# Patient Record
Sex: Male | Born: 1935 | Race: White | Hispanic: No | Marital: Married | Smoking: Never smoker
Health system: Southern US, Community
[De-identification: ages and names within clinical notes are randomized; demographics above are authoritative.]

## PROBLEM LIST (undated history)

## (undated) DIAGNOSIS — G459 Transient cerebral ischemic attack, unspecified: Secondary | ICD-10-CM

## (undated) DIAGNOSIS — E119 Type 2 diabetes mellitus without complications: Secondary | ICD-10-CM

## (undated) DIAGNOSIS — M1990 Unspecified osteoarthritis, unspecified site: Secondary | ICD-10-CM

## (undated) DIAGNOSIS — S329XXA Fracture of unspecified parts of lumbosacral spine and pelvis, initial encounter for closed fracture: Secondary | ICD-10-CM

## (undated) DIAGNOSIS — M810 Age-related osteoporosis without current pathological fracture: Secondary | ICD-10-CM

## (undated) DIAGNOSIS — N400 Enlarged prostate without lower urinary tract symptoms: Secondary | ICD-10-CM

## (undated) DIAGNOSIS — I10 Essential (primary) hypertension: Secondary | ICD-10-CM

## (undated) DIAGNOSIS — E039 Hypothyroidism, unspecified: Secondary | ICD-10-CM

## (undated) DIAGNOSIS — E785 Hyperlipidemia, unspecified: Secondary | ICD-10-CM

## (undated) HISTORY — DX: Unspecified osteoarthritis, unspecified site: M19.90

## (undated) HISTORY — DX: Fracture of unspecified parts of lumbosacral spine and pelvis, initial encounter for closed fracture: S32.9XXA

---

## 1996-05-14 HISTORY — PX: OTHER SURGICAL HISTORY: SHX169

## 2003-10-20 ENCOUNTER — Other Ambulatory Visit: Payer: Self-pay

## 2004-01-26 ENCOUNTER — Other Ambulatory Visit: Payer: Self-pay

## 2004-02-17 ENCOUNTER — Other Ambulatory Visit: Payer: Self-pay

## 2004-02-17 ENCOUNTER — Inpatient Hospital Stay: Payer: Self-pay | Admitting: Unknown Physician Specialty

## 2004-03-30 ENCOUNTER — Other Ambulatory Visit: Payer: Self-pay

## 2004-04-17 ENCOUNTER — Inpatient Hospital Stay: Payer: Self-pay | Admitting: General Practice

## 2004-11-20 ENCOUNTER — Encounter: Payer: Self-pay | Admitting: Internal Medicine

## 2004-11-23 ENCOUNTER — Ambulatory Visit: Payer: Self-pay | Admitting: Internal Medicine

## 2004-12-12 ENCOUNTER — Encounter: Payer: Self-pay | Admitting: Internal Medicine

## 2005-05-14 HISTORY — PX: OTHER SURGICAL HISTORY: SHX169

## 2006-01-10 ENCOUNTER — Ambulatory Visit: Payer: Self-pay

## 2006-02-13 ENCOUNTER — Ambulatory Visit: Payer: Self-pay | Admitting: General Practice

## 2006-02-13 ENCOUNTER — Other Ambulatory Visit: Payer: Self-pay

## 2006-02-20 ENCOUNTER — Ambulatory Visit: Payer: Self-pay | Admitting: General Practice

## 2006-05-14 HISTORY — PX: OTHER SURGICAL HISTORY: SHX169

## 2006-11-04 ENCOUNTER — Other Ambulatory Visit: Payer: Self-pay

## 2006-11-04 ENCOUNTER — Ambulatory Visit: Payer: Self-pay | Admitting: General Practice

## 2006-11-14 ENCOUNTER — Inpatient Hospital Stay: Payer: Self-pay | Admitting: General Practice

## 2007-02-27 ENCOUNTER — Ambulatory Visit: Payer: Self-pay | Admitting: Internal Medicine

## 2007-03-15 ENCOUNTER — Ambulatory Visit: Payer: Self-pay | Admitting: Internal Medicine

## 2007-04-14 ENCOUNTER — Ambulatory Visit: Payer: Self-pay | Admitting: Internal Medicine

## 2008-05-14 HISTORY — PX: LUMBAR LAMINECTOMY: SHX95

## 2009-02-10 ENCOUNTER — Ambulatory Visit: Payer: Self-pay | Admitting: Neurosurgery

## 2009-02-11 ENCOUNTER — Ambulatory Visit (HOSPITAL_COMMUNITY): Admission: RE | Admit: 2009-02-11 | Discharge: 2009-02-12 | Payer: Self-pay | Admitting: Neurosurgery

## 2010-06-04 ENCOUNTER — Encounter: Payer: Self-pay | Admitting: Neurosurgery

## 2010-08-17 LAB — GLUCOSE, CAPILLARY: Glucose-Capillary: 77 mg/dL (ref 70–99)

## 2010-08-17 LAB — BASIC METABOLIC PANEL
BUN: 15 mg/dL (ref 6–23)
CO2: 29 mEq/L (ref 19–32)
Calcium: 9.3 mg/dL (ref 8.4–10.5)
Chloride: 96 mEq/L (ref 96–112)
GFR calc Af Amer: >60 mL/min (ref >60)
Glucose, Bld: 95 mg/dL (ref 70–99)
Potassium: 4.9 mEq/L (ref 3.5–5.1)
Sodium: 134 mEq/L — ABNORMAL LOW (ref 135–145)

## 2010-08-17 LAB — CBC
RBC: 3.87 MIL/uL — ABNORMAL LOW (ref 4.22–5.81)
RDW: 13.5 % (ref 11.5–15.5)

## 2010-12-29 ENCOUNTER — Ambulatory Visit: Payer: Self-pay | Admitting: Ophthalmology

## 2011-02-07 ENCOUNTER — Ambulatory Visit: Payer: Self-pay | Admitting: Ophthalmology

## 2011-09-20 ENCOUNTER — Emergency Department: Payer: Self-pay | Admitting: *Deleted

## 2011-09-20 LAB — CBC
MCV: 97 fL (ref 80–100)
RDW: 13.1 % (ref 11.5–14.5)

## 2011-09-20 LAB — BASIC METABOLIC PANEL
Anion Gap: 6 — ABNORMAL LOW (ref 7–16)
BUN: 15 mg/dL (ref 7–18)
Calcium, Total: 9.1 mg/dL (ref 8.5–10.1)
Creatinine: 0.77 mg/dL (ref 0.60–1.30)
EGFR (African American): >60
Glucose: 193 mg/dL — ABNORMAL HIGH (ref 65–99)
Potassium: 5 mmol/L (ref 3.5–5.1)

## 2012-09-23 ENCOUNTER — Ambulatory Visit: Payer: Self-pay | Admitting: Internal Medicine

## 2014-05-21 DIAGNOSIS — E119 Type 2 diabetes mellitus without complications: Secondary | ICD-10-CM | POA: Diagnosis not present

## 2014-05-26 DIAGNOSIS — E119 Type 2 diabetes mellitus without complications: Secondary | ICD-10-CM | POA: Diagnosis not present

## 2014-05-26 DIAGNOSIS — Z794 Long term (current) use of insulin: Secondary | ICD-10-CM | POA: Diagnosis not present

## 2014-09-24 DIAGNOSIS — E1165 Type 2 diabetes mellitus with hyperglycemia: Secondary | ICD-10-CM | POA: Diagnosis not present

## 2014-09-24 DIAGNOSIS — Z79899 Other long term (current) drug therapy: Secondary | ICD-10-CM | POA: Diagnosis not present

## 2014-09-24 DIAGNOSIS — E780 Pure hypercholesterolemia: Secondary | ICD-10-CM | POA: Diagnosis not present

## 2014-10-15 DIAGNOSIS — N401 Enlarged prostate with lower urinary tract symptoms: Secondary | ICD-10-CM | POA: Diagnosis not present

## 2014-10-15 DIAGNOSIS — I10 Essential (primary) hypertension: Secondary | ICD-10-CM | POA: Diagnosis not present

## 2014-10-15 DIAGNOSIS — E119 Type 2 diabetes mellitus without complications: Secondary | ICD-10-CM | POA: Diagnosis not present

## 2014-10-15 DIAGNOSIS — E780 Pure hypercholesterolemia: Secondary | ICD-10-CM | POA: Diagnosis not present

## 2014-10-17 DIAGNOSIS — S81802A Unspecified open wound, left lower leg, initial encounter: Secondary | ICD-10-CM | POA: Diagnosis not present

## 2014-10-17 DIAGNOSIS — Z23 Encounter for immunization: Secondary | ICD-10-CM | POA: Diagnosis not present

## 2014-10-17 DIAGNOSIS — S41109A Unspecified open wound of unspecified upper arm, initial encounter: Secondary | ICD-10-CM | POA: Diagnosis not present

## 2014-10-22 DIAGNOSIS — E119 Type 2 diabetes mellitus without complications: Secondary | ICD-10-CM | POA: Diagnosis not present

## 2014-11-18 DIAGNOSIS — E119 Type 2 diabetes mellitus without complications: Secondary | ICD-10-CM | POA: Diagnosis not present

## 2014-12-01 DIAGNOSIS — E119 Type 2 diabetes mellitus without complications: Secondary | ICD-10-CM | POA: Diagnosis not present

## 2014-12-31 DIAGNOSIS — M25512 Pain in left shoulder: Secondary | ICD-10-CM | POA: Diagnosis not present

## 2015-04-15 DIAGNOSIS — E119 Type 2 diabetes mellitus without complications: Secondary | ICD-10-CM | POA: Diagnosis not present

## 2015-04-15 DIAGNOSIS — Z79899 Other long term (current) drug therapy: Secondary | ICD-10-CM | POA: Diagnosis not present

## 2015-04-15 DIAGNOSIS — E7800 Pure hypercholesterolemia, unspecified: Secondary | ICD-10-CM | POA: Diagnosis not present

## 2015-04-21 DIAGNOSIS — Z79899 Other long term (current) drug therapy: Secondary | ICD-10-CM | POA: Diagnosis not present

## 2015-04-21 DIAGNOSIS — Z0000 Encounter for general adult medical examination without abnormal findings: Secondary | ICD-10-CM | POA: Diagnosis not present

## 2015-04-21 DIAGNOSIS — E119 Type 2 diabetes mellitus without complications: Secondary | ICD-10-CM | POA: Diagnosis not present

## 2015-06-06 DIAGNOSIS — E119 Type 2 diabetes mellitus without complications: Secondary | ICD-10-CM | POA: Diagnosis not present

## 2015-06-13 DIAGNOSIS — E119 Type 2 diabetes mellitus without complications: Secondary | ICD-10-CM | POA: Diagnosis not present

## 2015-06-13 DIAGNOSIS — Z794 Long term (current) use of insulin: Secondary | ICD-10-CM | POA: Diagnosis not present

## 2015-06-13 DIAGNOSIS — M1990 Unspecified osteoarthritis, unspecified site: Secondary | ICD-10-CM | POA: Diagnosis not present

## 2015-06-30 DIAGNOSIS — H93293 Other abnormal auditory perceptions, bilateral: Secondary | ICD-10-CM | POA: Diagnosis not present

## 2015-06-30 DIAGNOSIS — H6123 Impacted cerumen, bilateral: Secondary | ICD-10-CM | POA: Diagnosis not present

## 2015-08-01 DIAGNOSIS — M25511 Pain in right shoulder: Secondary | ICD-10-CM | POA: Diagnosis not present

## 2015-08-01 DIAGNOSIS — G8911 Acute pain due to trauma: Secondary | ICD-10-CM | POA: Diagnosis not present

## 2015-09-02 ENCOUNTER — Other Ambulatory Visit: Payer: Self-pay | Admitting: Neurological Surgery

## 2015-09-02 DIAGNOSIS — D496 Neoplasm of unspecified behavior of brain: Secondary | ICD-10-CM

## 2015-09-10 ENCOUNTER — Emergency Department: Payer: Commercial Managed Care - HMO

## 2015-09-10 ENCOUNTER — Encounter: Payer: Self-pay | Admitting: *Deleted

## 2015-09-10 ENCOUNTER — Inpatient Hospital Stay
Admission: EM | Admit: 2015-09-10 | Discharge: 2015-09-12 | DRG: 536 | Disposition: A | Discharge status: Skilled Nursing Facility | Payer: Commercial Managed Care - HMO | Attending: Internal Medicine | Admitting: Internal Medicine

## 2015-09-10 DIAGNOSIS — I10 Essential (primary) hypertension: Secondary | ICD-10-CM | POA: Diagnosis present

## 2015-09-10 DIAGNOSIS — W19XXXA Unspecified fall, initial encounter: Secondary | ICD-10-CM | POA: Diagnosis present

## 2015-09-10 DIAGNOSIS — Z833 Family history of diabetes mellitus: Secondary | ICD-10-CM

## 2015-09-10 DIAGNOSIS — Z8673 Personal history of transient ischemic attack (TIA), and cerebral infarction without residual deficits: Secondary | ICD-10-CM | POA: Diagnosis not present

## 2015-09-10 DIAGNOSIS — S72114A Nondisplaced fracture of greater trochanter of right femur, initial encounter for closed fracture: Secondary | ICD-10-CM | POA: Diagnosis not present

## 2015-09-10 DIAGNOSIS — S72001D Fracture of unspecified part of neck of right femur, subsequent encounter for closed fracture with routine healing: Secondary | ICD-10-CM | POA: Diagnosis not present

## 2015-09-10 DIAGNOSIS — Z96643 Presence of artificial hip joint, bilateral: Secondary | ICD-10-CM | POA: Diagnosis present

## 2015-09-10 DIAGNOSIS — N138 Other obstructive and reflux uropathy: Secondary | ICD-10-CM | POA: Diagnosis not present

## 2015-09-10 DIAGNOSIS — Z794 Long term (current) use of insulin: Secondary | ICD-10-CM

## 2015-09-10 DIAGNOSIS — Z9181 History of falling: Secondary | ICD-10-CM | POA: Diagnosis not present

## 2015-09-10 DIAGNOSIS — N400 Enlarged prostate without lower urinary tract symptoms: Secondary | ICD-10-CM

## 2015-09-10 DIAGNOSIS — R2689 Other abnormalities of gait and mobility: Secondary | ICD-10-CM | POA: Diagnosis not present

## 2015-09-10 DIAGNOSIS — E119 Type 2 diabetes mellitus without complications: Secondary | ICD-10-CM | POA: Diagnosis present

## 2015-09-10 DIAGNOSIS — Z8249 Family history of ischemic heart disease and other diseases of the circulatory system: Secondary | ICD-10-CM | POA: Diagnosis not present

## 2015-09-10 DIAGNOSIS — E785 Hyperlipidemia, unspecified: Secondary | ICD-10-CM | POA: Diagnosis present

## 2015-09-10 DIAGNOSIS — R293 Abnormal posture: Secondary | ICD-10-CM | POA: Diagnosis not present

## 2015-09-10 DIAGNOSIS — M6281 Muscle weakness (generalized): Secondary | ICD-10-CM | POA: Diagnosis not present

## 2015-09-10 DIAGNOSIS — Z7982 Long term (current) use of aspirin: Secondary | ICD-10-CM

## 2015-09-10 DIAGNOSIS — S72009A Fracture of unspecified part of neck of unspecified femur, initial encounter for closed fracture: Secondary | ICD-10-CM | POA: Diagnosis present

## 2015-09-10 DIAGNOSIS — S72001A Fracture of unspecified part of neck of right femur, initial encounter for closed fracture: Secondary | ICD-10-CM | POA: Diagnosis not present

## 2015-09-10 DIAGNOSIS — Z79899 Other long term (current) drug therapy: Secondary | ICD-10-CM

## 2015-09-10 DIAGNOSIS — S72111A Displaced fracture of greater trochanter of right femur, initial encounter for closed fracture: Secondary | ICD-10-CM | POA: Diagnosis not present

## 2015-09-10 DIAGNOSIS — S79911A Unspecified injury of right hip, initial encounter: Secondary | ICD-10-CM | POA: Diagnosis not present

## 2015-09-10 DIAGNOSIS — N401 Enlarged prostate with lower urinary tract symptoms: Secondary | ICD-10-CM | POA: Diagnosis not present

## 2015-09-10 DIAGNOSIS — M25551 Pain in right hip: Secondary | ICD-10-CM | POA: Diagnosis not present

## 2015-09-10 LAB — BASIC METABOLIC PANEL
Anion gap: 9 (ref 5–15)
BUN: 16 mg/dL (ref 6–20)
CALCIUM: 9.1 mg/dL (ref 8.9–10.3)
CHLORIDE: 99 mmol/L — AB (ref 101–111)
CO2: 27 mmol/L (ref 22–32)
CREATININE: 0.82 mg/dL (ref 0.61–1.24)
Glucose, Bld: 216 mg/dL — ABNORMAL HIGH (ref 65–99)
Potassium: 4.3 mmol/L (ref 3.5–5.1)
SODIUM: 135 mmol/L (ref 135–145)

## 2015-09-10 LAB — CBC
HCT: 35 % — ABNORMAL LOW (ref 40.0–52.0)
HEMOGLOBIN: 12 g/dL — AB (ref 13.0–18.0)
MCH: 31.7 pg (ref 26.0–34.0)
MCHC: 34.2 g/dL (ref 32.0–36.0)
MCV: 92.7 fL (ref 80.0–100.0)
PLATELETS: 166 10*3/uL (ref 150–440)
RBC: 3.77 MIL/uL — ABNORMAL LOW (ref 4.40–5.90)
RDW: 13.2 % (ref 11.5–14.5)
WBC: 10.7 10*3/uL — ABNORMAL HIGH (ref 3.8–10.6)

## 2015-09-10 LAB — GLUCOSE, CAPILLARY
GLUCOSE-CAPILLARY: 190 mg/dL — AB (ref 65–99)
GLUCOSE-CAPILLARY: 286 mg/dL — AB (ref 65–99)

## 2015-09-10 MED ORDER — FINASTERIDE 5 MG PO TABS
5.0000 mg | ORAL_TABLET | Freq: Every day | ORAL | Status: DC
  Administered 2015-09-11 – 2015-09-12 (×2): 5 mg via ORAL
  Filled 2015-09-10 (×2): qty 1

## 2015-09-10 MED ORDER — METFORMIN HCL 500 MG PO TABS
1000.0000 mg | ORAL_TABLET | Freq: Two times a day (BID) | ORAL | Status: DC
  Administered 2015-09-10 – 2015-09-12 (×4): 1000 mg via ORAL
  Filled 2015-09-10 (×4): qty 2

## 2015-09-10 MED ORDER — SODIUM CHLORIDE 0.9 % IV SOLN
INTRAVENOUS | Status: DC
  Administered 2015-09-10 – 2015-09-11 (×2): via INTRAVENOUS

## 2015-09-10 MED ORDER — SIMVASTATIN 20 MG PO TABS
20.0000 mg | ORAL_TABLET | Freq: Every day | ORAL | Status: DC
  Administered 2015-09-10 – 2015-09-11 (×2): 20 mg via ORAL
  Filled 2015-09-10 (×2): qty 1

## 2015-09-10 MED ORDER — MORPHINE SULFATE (PF) 2 MG/ML IV SOLN
2.0000 mg | INTRAVENOUS | Status: DC | PRN
  Administered 2015-09-11: 2 mg via INTRAVENOUS
  Filled 2015-09-10: qty 1

## 2015-09-10 MED ORDER — SENNOSIDES-DOCUSATE SODIUM 8.6-50 MG PO TABS: 1.0000 | ORAL_TABLET | Freq: Every evening | ORAL | Status: DC | PRN

## 2015-09-10 MED ORDER — ACETAMINOPHEN 325 MG PO TABS: 650.0000 mg | ORAL_TABLET | Freq: Four times a day (QID) | ORAL | Status: DC | PRN

## 2015-09-10 MED ORDER — LISINOPRIL 20 MG PO TABS
20.0000 mg | ORAL_TABLET | Freq: Every day | ORAL | Status: DC
  Administered 2015-09-11 – 2015-09-12 (×2): 20 mg via ORAL
  Filled 2015-09-10 (×2): qty 1

## 2015-09-10 MED ORDER — SODIUM CHLORIDE 0.9% FLUSH: 3.0000 mL | Freq: Two times a day (BID) | INTRAVENOUS | Status: DC

## 2015-09-10 MED ORDER — ONDANSETRON HCL 4 MG PO TABS: 4.0000 mg | ORAL_TABLET | Freq: Four times a day (QID) | ORAL | Status: DC | PRN

## 2015-09-10 MED ORDER — SODIUM CHLORIDE 0.9% FLUSH
3.0000 mL | Freq: Two times a day (BID) | INTRAVENOUS | Status: DC
  Administered 2015-09-11 – 2015-09-12 (×2): 3 mL via INTRAVENOUS

## 2015-09-10 MED ORDER — ONDANSETRON HCL 4 MG/2ML IJ SOLN: 4.0000 mg | Freq: Four times a day (QID) | INTRAMUSCULAR | Status: DC | PRN

## 2015-09-10 MED ORDER — ACETAMINOPHEN 650 MG RE SUPP: 650.0000 mg | Freq: Four times a day (QID) | RECTAL | Status: DC | PRN

## 2015-09-10 MED ORDER — OXYCODONE HCL 5 MG PO TABS
5.0000 mg | ORAL_TABLET | ORAL | Status: DC | PRN
  Administered 2015-09-10 – 2015-09-12 (×9): 5 mg via ORAL
  Filled 2015-09-10 (×9): qty 1

## 2015-09-10 MED ORDER — ASPIRIN EC 81 MG PO TBEC
81.0000 mg | DELAYED_RELEASE_TABLET | Freq: Every day | ORAL | Status: DC
  Administered 2015-09-11: 81 mg via ORAL
  Filled 2015-09-10 (×2): qty 1

## 2015-09-10 MED ORDER — INSULIN GLARGINE 100 UNIT/ML ~~LOC~~ SOLN
18.0000 [IU] | Freq: Every day | SUBCUTANEOUS | Status: DC
  Administered 2015-09-11 – 2015-09-12 (×2): 18 [IU] via SUBCUTANEOUS
  Filled 2015-09-10 (×3): qty 0.18

## 2015-09-10 MED ORDER — HYDROCODONE-ACETAMINOPHEN 5-325 MG PO TABS
1.0000 | ORAL_TABLET | Freq: Once | ORAL | Status: AC
  Administered 2015-09-10: 1 via ORAL
  Filled 2015-09-10: qty 1

## 2015-09-10 MED ORDER — CLOPIDOGREL BISULFATE 75 MG PO TABS
75.0000 mg | ORAL_TABLET | Freq: Every day | ORAL | Status: DC
  Administered 2015-09-11 – 2015-09-12 (×2): 75 mg via ORAL
  Filled 2015-09-10 (×2): qty 1

## 2015-09-10 MED ORDER — ENOXAPARIN SODIUM 40 MG/0.4ML ~~LOC~~ SOLN
40.0000 mg | SUBCUTANEOUS | Status: DC
  Administered 2015-09-10 – 2015-09-11 (×2): 40 mg via SUBCUTANEOUS
  Filled 2015-09-10 (×2): qty 0.4

## 2015-09-10 MED ORDER — SODIUM CHLORIDE 0.9% FLUSH: 3.0000 mL | INTRAVENOUS | Status: DC | PRN

## 2015-09-10 MED ORDER — MORPHINE SULFATE (PF) 2 MG/ML IV SOLN
2.0000 mg | Freq: Once | INTRAVENOUS | Status: AC
  Administered 2015-09-10: 2 mg via INTRAVENOUS
  Filled 2015-09-10: qty 1

## 2015-09-10 MED ORDER — SODIUM CHLORIDE 0.9 % IV SOLN: 250.0000 mL | INTRAVENOUS | Status: DC | PRN

## 2015-09-10 MED ORDER — DOCUSATE SODIUM 100 MG PO CAPS
100.0000 mg | ORAL_CAPSULE | Freq: Two times a day (BID) | ORAL | Status: DC
  Administered 2015-09-10 – 2015-09-12 (×4): 100 mg via ORAL
  Filled 2015-09-10 (×4): qty 1

## 2015-09-10 MED ORDER — TAMSULOSIN HCL 0.4 MG PO CAPS
0.4000 mg | ORAL_CAPSULE | Freq: Two times a day (BID) | ORAL | Status: DC
  Administered 2015-09-10 – 2015-09-12 (×4): 0.4 mg via ORAL
  Filled 2015-09-10 (×4): qty 1

## 2015-09-10 NOTE — H&P (Signed)
[place] [facility] [place] NAME: Brian Barry    MR#:  [id number]  DATE OF BIRTH:  Feb 12, 1930  DATE OF ADMISSION:  09/10/2015  PRIMARY CARE PHYSICIAN: No primary care provider on file.   REQUESTING/REFERRING PHYSICIAN: Dr. Reita Cliche  CHIEF COMPLAINT:   Fall and hip pain HISTORY OF PRESENT ILLNESS:  Brian Barry  is a 82 y.o. male with a known history of right  hip replacement by Dr. Marry Guan approximately 20 years ago and diabetes , hypertension, hyperlipidemia and 2 histories of TIA is presenting to the ED after he sustained a fall. Patient tripped over by stepping on a hole and fell on his rt side.  Right hip x-ray did not reveal any fracture but CAT scan of the hip has revealed total right hip arthroplasty and nondisplaced fracture of the right greater trochanter extending into the subtrochanteric femur  PAST MEDICAL HISTORY:   Diabetic medicines, hypertension and hyperlipidemia  2 TIAs  PAST SURGICAL HISTOIRY:  Hip replacement   SOCIAL HISTORY:   Social History  Substance Use Topics  . Smoking status: Never Smoker   . Smokeless tobacco: Not on file  . Alcohol Use: Not on file    FAMILY HISTORY:  Hypertension and diabetes mellitus runs in his family   DRUG ALLERGIES:  No Known Allergies  REVIEW OF SYSTEMS:  CONSTITUTIONAL: No fever, fatigue or weakness.  EYES: No blurred or double vision.  EARS, NOSE, AND THROAT: No tinnitus or ear pain.  RESPIRATORY: No cough, shortness of breath, wheezing or hemoptysis.  CARDIOVASCULAR: No chest pain, orthopnea, edema.  GASTROINTESTINAL: No nausea, vomiting, diarrhea or abdominal pain.  GENITOURINARY: No dysuria, hematuria.  ENDOCRINE: No polyuria, nocturia,  HEMATOLOGY: No anemia, easy bruising or bleeding SKIN: No rash or lesion. MUSCULOSKELETAL: reporting hip pain status post a hip replacement  .   NEUROLOGIC: No tingling, numbness, weakness.  PSYCHIATRY: No anxiety or depression.    MEDICATIONS AT HOME:   Prior to Admission medications   Medication Sig Start Date End Date Taking? Authorizing Provider  aspirin EC 81 MG tablet Take 81 mg by mouth daily.   Yes Historical Provider, MD  clopidogrel (PLAVIX) 75 MG tablet Take 75 mg by mouth daily. 07/11/15  Yes Historical Provider, MD  finasteride (PROSCAR) 5 MG tablet Take 5 mg by mouth daily. 07/11/15  Yes Historical Provider, MD  LANTUS SOLOSTAR 100 UNIT/ML Solostar Pen Inject 18 Units into the skin every morning. 06/13/15  Yes Historical Provider, MD  lisinopril (PRINIVIL,ZESTRIL) 20 MG tablet Take 20 mg by mouth daily. 07/11/15  Yes Historical Provider, MD  metFORMIN (GLUCOPHAGE) 500 MG tablet Take 1,000 mg by mouth 2 (two) times daily. 08/12/15  Yes Historical Provider, MD  simvastatin (ZOCOR) 20 MG tablet Take 20 mg by mouth at bedtime.  07/11/15  Yes Historical Provider, MD  tamsulosin (FLOMAX) 0.4 MG CAPS capsule Take 0.4 mg by mouth 2 (two) times daily. 08/08/15  Yes Historical Provider, MD      VITAL SIGNS:  Blood pressure 154/52, pulse 77, temperature 98.6 F (37 C), temperature source Oral, resp. rate 18, height 5\' 9"  (1.753 m), weight 70.308 kg (155 lb), SpO2 96 %.  PHYSICAL EXAMINATION:  GENERAL:  82 y.o.-year-old patient lying in the bed with no acute distress.  EYES: Pupils equal, round, reactive to light and accommodation. No scleral icterus. Extraocular muscles intact.  HEENT: Head atraumatic, normocephalic. Oropharynx and nasopharynx clear.  NECK:  Supple, no jugular venous distention. No thyroid  enlargement, no tenderness.  LUNGS: Normal breath sounds bilaterally, no wheezing, rales,rhonchi or crepitation. No use of accessory muscles of respiration.  CARDIOVASCULAR: S1, S2 normal. No murmurs, rubs, or gallops.  ABDOMEN: Soft, nontender, nondistended. Bowel sounds present. No organomegaly or mass.  EXTREMITIES:  right hip is tender to touch externally rotated and abducted. No pedal edema, cyanosis, or  clubbing.  NEUROLOGIC: Cranial nerves II through XII are intact. Muscle strength 5/5 in all extremities. Sensation intact. Gait not checked.  PSYCHIATRIC: The patient is alert and oriented x 3.  SKIN: No obvious rash, lesion, or ulcer.   LABORATORY PANEL:   CBC  Recent Labs Lab 09/10/15 1509  WBC 10.7*  HGB 12.0*  HCT 35.0*  PLT 166   ------------------------------------------------------------------------------------------------------------------  Chemistries   Recent Labs Lab 09/10/15 1509  NA 135  K 4.3  CL 99*  CO2 27  GLUCOSE 216*  BUN 16  CREATININE 0.82  CALCIUM 9.1   ------------------------------------------------------------------------------------------------------------------  Cardiac Enzymes No results for input(s): TROPONINI in the last 168 hours. ------------------------------------------------------------------------------------------------------------------  RADIOLOGY:  Ct Hip Right Wo Contrast  09/10/2015  CLINICAL DATA:  Landed on the right hip with subsequent pain. EXAM: CT OF THE RIGHT HIP WITHOUT CONTRAST TECHNIQUE: Multidetector CT imaging of the right hip was performed according to the standard protocol. Multiplanar CT image reconstructions were also generated. COMPARISON:  None. FINDINGS: There is a total left hip arthroplasty with beam hardening artifact resulting from the arthroplasty partially obscuring the adjacent soft tissue and osseous structures. There is an acute nondisplaced fracture of the right greater trochanter extending into the subtrochanteric femur. There is no hardware failure or complication. Muscles scratch at the muscles are normal. There is no fluid collection or hematoma. There is no muscle atrophy. There is peripheral vascular atherosclerotic disease. IMPRESSION: Total left hip arthroplasty with beam hardening artifact resulting from the arthroplasty partially obscuring the adjacent soft tissue and osseous structures. Acute  nondisplaced fracture of the right greater trochanter extending into the subtrochanteric femur. Electronically Signed   By: Kathreen Devoid   On: 09/10/2015 13:17   Dg Hip Unilat With Pelvis 2-3 Views Right  09/10/2015  CLINICAL DATA:  82 year old male with history of right-sided hip pain after fall. Unable to bear weight. EXAM: DG HIP (WITH OR WITHOUT PELVIS) 2-3V RIGHT COMPARISON:  No priors. FINDINGS: The bony pelvis is intact. Bilateral total hip arthroplasties are noted. Prostatic right femoral head is located. No periprosthetic fractures or other acute abnormalities are identified. There is asymmetric CT of the left prosthetic femoral head in the superior lateral aspect of the left acetabulum, suggesting some prosthetic wear. IMPRESSION: 1. No acute osseous abnormality of the bony pelvis or the right hip. 2. Status post bilateral total hip arthroplasties, as above, with evidence of significant wear in the prosthetic left acetabular cup. Electronically Signed   By: Vinnie Langton M.D.   On: 09/10/2015 11:21    EKG:   Orders placed or performed in visit on 11/04/06  . EKG 12-Lead    IMPRESSION AND PLAN:  82 year old male brought into the ED after he sustained a fall. CT of the right hip has revealed acute right hip fracture but he is not a surgical candidate in view of right hip arthroplasty as recommended by Dr. Sabra Heck. Patient is getting admitted to the hospital for pain management and possible placement    #acute nondisplaced fracture of the right greater trochanter  Patient is not a surgical candidate according to orthopedics Dr. Sabra Heck  Provide  pain management as needed Consult orthopedics Dr. Sabra Heck and physical therapy Patient most likely might need a placement  #Essential hypertension Continue home medication lisinopril and titrate as needed  #Insulin requiring diabetes mellitus- Continue Lantus subcutaneous daily and metformin and sliding scale insulin. Patient will be on  diabetic diet  #Hyperlipidemia continue statin  #History's of TIA-continue aspirin and Plavix as patient is not getting surgery for hip fracture     All the records are reviewed and case discussed with ED provider. Management plans discussed with the patient, wife  and they are in agreement.  CODE STATUS: fc,Wife and daughter at the healthcare power of attorney  TOTAL TIME TAKING CARE OF THIS PATIENT:45 minutes.    Nicholes Mango M.D on 09/10/2015 at 5:58 PM  Between 7am to 6pm - Pager - [phone]  After 6pm go to [email or web address] - password [email or web address] [facility]  [facility]  Office  [phone]  CC: Primary care physician; No primary care provider on file.

## 2015-09-10 NOTE — ED Provider Notes (Signed)
[facility] Emergency Department Provider Note   ____________________________________________  Time seen: I have reviewed the triage vital signs and the triage nursing note.  HISTORY  Chief Complaint Hip Pain   Historian Patient and spouse and daughter  HPI Brian Barry is a 82 y.o. male with hx of right hip replacement by [facility] near 20 years ago, here with fall onto metal directly over the right hip today. Pain is moderate. Unable to walk or get up. He was brought in here by EMS.  No weakness or numbness. No back pain. No head injury. No chest or abdominal pain.    No past medical history on file.  There are no active problems to display for this patient.   No past surgical history on file.  Current Outpatient Rx  Name  Route  Sig  Dispense  Refill  . aspirin EC 81 MG tablet   Oral   Take 81 mg by mouth daily.         . clopidogrel (PLAVIX) 75 MG tablet   Oral   Take 75 mg by mouth daily.         . finasteride (PROSCAR) 5 MG tablet   Oral   Take 5 mg by mouth daily.         Marland Kitchen LANTUS SOLOSTAR 100 UNIT/ML Solostar Pen   Subcutaneous   Inject 18 Units into the skin every morning.           Dispense as written.   Marland Kitchen lisinopril (PRINIVIL,ZESTRIL) 20 MG tablet   Oral   Take 20 mg by mouth daily.         . metFORMIN (GLUCOPHAGE) 500 MG tablet   Oral   Take 1,000 mg by mouth 2 (two) times daily.         . simvastatin (ZOCOR) 20 MG tablet   Oral   Take 20 mg by mouth at bedtime.          . tamsulosin (FLOMAX) 0.4 MG CAPS capsule   Oral   Take 0.4 mg by mouth 2 (two) times daily.           Allergies Review of patient's allergies indicates no known allergies.  No family history on file.  Social History Social History  Substance Use Topics  . Smoking status: Not on file  . Smokeless tobacco: Not on file  . Alcohol Use: Not on file    Review of Systems  Constitutional: Negative for Recent illness. Eyes:  Negative for visual changes. ENT: Negative for sore throat. Cardiovascular: Negative for chest pain. Respiratory: Negative for shortness of breath. Gastrointestinal: Negative for abdominal pain, vomiting and diarrhea. Genitourinary: Negative for dysuria. Musculoskeletal: Negative for back pain. Skin: Negative for rash. Neurological: Negative for headache. 10 point Review of Systems otherwise negative ____________________________________________   PHYSICAL EXAM:  VITAL SIGNS: ED Triage Vitals  Enc Vitals Group     BP 09/10/15 1047 140/71 mmHg     Pulse Rate 09/10/15 1047 93     Resp 09/10/15 1047 94     Temp 09/10/15 1047 98.2 F (36.8 C)     Temp Source 09/10/15 1047 Oral     SpO2 09/10/15 1047 94 %     Weight 09/10/15 1047 155 lb (70.308 kg)     Height 09/10/15 1047 5\' 9"  (1.753 m)     Head Cir --      Peak Flow --      Pain Score 09/10/15 1047 2  Pain Loc --      Pain Edu? --      Excl. in [place]? --      Constitutional: Alert and oriented. Well appearing and in no distress. HEENT   Head: Normocephalic and atraumatic.      Eyes: Conjunctivae are normal. PERRL. Normal extraocular movements.      Ears:         Nose: No congestion/rhinnorhea.   Mouth/Throat: Mucous membranes are moist.   Neck: No stridor. Cardiovascular/Chest: Normal rate, regular rhythm.  No murmurs, rubs, or gallops. Respiratory: Normal respiratory effort without tachypnea nor retractions. Breath sounds are clear and equal bilaterally. No wheezes/rales/rhonchi. Gastrointestinal: Soft. No distention, no guarding, no rebound. Nontender.    Genitourinary/rectal:Deferred Musculoskeletal: Pelvis stable. Hip tenderness to palpation right lateral. No visible ecchymosis. Neurologic:  Normal speech and language. No gross or focal neurologic deficits are appreciated. Skin:  Skin is warm, dry and intact. No rash noted. Psychiatric: Mood and affect are normal. Speech and behavior are normal. Patient  exhibits appropriate insight and judgment.  ____________________________________________   EKG I, Lisa Roca, MD, the attending physician have personally viewed and interpreted all ECGs.  None ____________________________________________  LABS (pertinent positives/negatives)  None  ____________________________________________  RADIOLOGY All Xrays were viewed by me. Imaging interpreted by Radiologist.  Hip right with pelvis:  IMPRESSION: 1. No acute osseous abnormality of the bony pelvis or the right hip. 2. Status post bilateral total hip arthroplasties, as above, with evidence of significant wear in the prosthetic left acetabular cup  CT brain without contrast:  CLINICAL DATA: Landed on the right hip with subsequent pain.  EXAM: CT OF THE RIGHT HIP WITHOUT CONTRAST  TECHNIQUE: Multidetector CT imaging of the right hip was performed according to the standard protocol. Multiplanar CT image reconstructions were also generated.  COMPARISON: None.  FINDINGS: There is a total left hip arthroplasty with beam hardening artifact resulting from the arthroplasty partially obscuring the adjacent soft tissue and osseous structures. There is an acute nondisplaced fracture of the right greater trochanter extending into the subtrochanteric femur. There is no hardware failure or complication.  Muscles scratch at the muscles are normal. There is no fluid collection or hematoma. There is no muscle atrophy. There is peripheral vascular atherosclerotic disease.  IMPRESSION: Total left hip arthroplasty with beam hardening artifact resulting from the arthroplasty partially obscuring the adjacent soft tissue and osseous structures.  Acute nondisplaced fracture of the right greater trochanter extending into the subtrochanteric femur. __________________________________________  PROCEDURES  Procedure(s) performed: None  Critical Care performed:  None  ____________________________________________   ED COURSE / ASSESSMENT AND PLAN  Pertinent labs & imaging results that were available during my care of the patient were reviewed by me and considered in my medical decision making (see chart for details).   No other complaints other than right hip laterally.  Xray without traumatic finding, but pt unable to sit up.  Tried norco and ordered CT hip.  CT hip indicates trochanteric fracture. I discussed this with Dr. Sabra Heck, on call for orthopedics, who recommended nonoperative management given the fact that the patient already has a hip replacement. He recommended touchdown weightbearing as tolerated. Use of walker.  Patient was unable to tolerate any weight. IV was placed is given IV pain medication. Patient will need hospitalization for IV pain control and physical therapy evaluation for likely acute care rehabilitation placement.     CONSULTATIONS:   Orthopedics, Dr. Sabra Heck, by phone who reviewed the images and the clinical history  and made recommendations.   Patient / Family / Caregiver informed of clinical course, medical decision-making process, and agree with plan.    ___________________________________________   FINAL CLINICAL IMPRESSION(S) / ED DIAGNOSES   Final diagnoses:  Closed right hip fracture, initial encounter [facility])              Note: This dictation was prepared with Dragon dictation. Any transcriptional errors that result from this process are unintentional   Lisa Roca, MD 09/10/15 1440

## 2015-09-10 NOTE — ED Notes (Signed)
Pt states that he was pushing a 2 wheeled cart, stepped in a hole, and landed on his rt hip on a grate. Pt has a hx of bilat hip replacements and left knee replacement, pt is having pain in the rt hip, pt is able to straighten the leg, but is painful to do so

## 2015-09-11 LAB — URINALYSIS COMPLETE WITH MICROSCOPIC (ARMC ONLY)
BILIRUBIN URINE: NEGATIVE
Bacteria, UA: NONE SEEN
GLUCOSE, UA: 50 mg/dL — AB
Hgb urine dipstick: NEGATIVE
KETONES UR: NEGATIVE mg/dL
Leukocytes, UA: NEGATIVE
Nitrite: NEGATIVE
Protein, ur: NEGATIVE mg/dL
RBC / HPF: NONE SEEN RBC/hpf (ref 0–5)
SPECIFIC GRAVITY, URINE: 1.006 (ref 1.005–1.030)
Squamous Epithelial / LPF: NONE SEEN
WBC, UA: NONE SEEN WBC/hpf (ref 0–5)
pH: 5 (ref 5.0–8.0)

## 2015-09-11 LAB — CBC
HEMATOCRIT: 32.6 % — AB (ref 40.0–52.0)
HEMOGLOBIN: 11.1 g/dL — AB (ref 13.0–18.0)
MCH: 32.2 pg (ref 26.0–34.0)
MCHC: 34 g/dL (ref 32.0–36.0)
MCV: 94.5 fL (ref 80.0–100.0)
PLATELETS: 147 10*3/uL — AB (ref 150–440)
RBC: 3.45 MIL/uL — AB (ref 4.40–5.90)
RDW: 13.1 % (ref 11.5–14.5)
WBC: 6.5 10*3/uL (ref 3.8–10.6)

## 2015-09-11 LAB — COMPREHENSIVE METABOLIC PANEL
ALBUMIN: 3.6 g/dL (ref 3.5–5.0)
ALK PHOS: 52 U/L (ref 38–126)
ALT: 14 U/L — ABNORMAL LOW (ref 17–63)
ANION GAP: 9 (ref 5–15)
AST: 21 U/L (ref 15–41)
BUN: 13 mg/dL (ref 6–20)
CALCIUM: 8.6 mg/dL — AB (ref 8.9–10.3)
CHLORIDE: 102 mmol/L (ref 101–111)
CO2: 26 mmol/L (ref 22–32)
Creatinine, Ser: 0.72 mg/dL (ref 0.61–1.24)
GFR calc non Af Amer: >60 mL/min (ref >60)
Glucose, Bld: 159 mg/dL — ABNORMAL HIGH (ref 65–99)
POTASSIUM: 3.8 mmol/L (ref 3.5–5.1)
SODIUM: 137 mmol/L (ref 135–145)
Total Bilirubin: 0.8 mg/dL (ref 0.3–1.2)
Total Protein: 5.6 g/dL — ABNORMAL LOW (ref 6.5–8.1)

## 2015-09-11 LAB — GLUCOSE, CAPILLARY: GLUCOSE-CAPILLARY: 135 mg/dL — AB (ref 65–99)

## 2015-09-11 MED ORDER — POLYETHYLENE GLYCOL 3350 17 G PO PACK
17.0000 g | PACK | Freq: Every day | ORAL | Status: DC
  Administered 2015-09-11 – 2015-09-12 (×2): 17 g via ORAL
  Filled 2015-09-11 (×2): qty 1

## 2015-09-11 NOTE — Consult Note (Signed)
ORTHOPAEDIC CONSULTATION  REQUESTING PHYSICIAN: Loletha Grayer, MD  Chief Complaint: Right hip pain  HPI: Brian Barry is a 82 y.o. male who complains of  right hip pain following a fall yesterday at home.  He tripped on a steel grating.  He has had bilateral total hip replacements done by Dr. Marry Guan years ago.  The right one was revised for dislocations.  He was seen in the emergency room at [facility] and x-rays and CT scan showed a nondisplaced hairline fracture of the right greater trochanter.  There was no sign of loosening or damage to the total hip replacement itself.  He was admitted for pain control as he could not ambulate.  No past medical history on file. No past surgical history on file. Social History   Social History  . Marital Status: Married    Spouse Name: N/A  . Number of Children: N/A  . Years of Education: N/A   Social History Main Topics  . Smoking status: Never Smoker   . Smokeless tobacco: Not on file  . Alcohol Use: Not on file  . Drug Use: Not on file  . Sexual Activity: Not on file   Other Topics Concern  . Not on file   Social History Narrative  . No narrative on file   No family history on file. No Known Allergies Prior to Admission medications   Medication Sig Start Date End Date Taking? Authorizing Provider  aspirin EC 81 MG tablet Take 81 mg by mouth daily.   Yes Historical Provider, MD  clopidogrel (PLAVIX) 75 MG tablet Take 75 mg by mouth daily. 07/11/15  Yes Historical Provider, MD  finasteride (PROSCAR) 5 MG tablet Take 5 mg by mouth daily. 07/11/15  Yes Historical Provider, MD  LANTUS SOLOSTAR 100 UNIT/ML Solostar Pen Inject 18 Units into the skin every morning. 06/13/15  Yes Historical Provider, MD  lisinopril (PRINIVIL,ZESTRIL) 20 MG tablet Take 20 mg by mouth daily. 07/11/15  Yes Historical Provider, MD  metFORMIN (GLUCOPHAGE) 500 MG tablet Take 1,000 mg by mouth 2 (two) times daily. 08/12/15  Yes Historical Provider, MD  simvastatin (ZOCOR)  20 MG tablet Take 20 mg by mouth at bedtime.  07/11/15  Yes Historical Provider, MD  tamsulosin (FLOMAX) 0.4 MG CAPS capsule Take 0.4 mg by mouth 2 (two) times daily. 08/08/15  Yes Historical Provider, MD   Ct Hip Right Wo Contrast  09/10/2015  CLINICAL DATA:  Landed on the right hip with subsequent pain. EXAM: CT OF THE RIGHT HIP WITHOUT CONTRAST TECHNIQUE: Multidetector CT imaging of the right hip was performed according to the standard protocol. Multiplanar CT image reconstructions were also generated. COMPARISON:  None. FINDINGS: There is a total left hip arthroplasty with beam hardening artifact resulting from the arthroplasty partially obscuring the adjacent soft tissue and osseous structures. There is an acute nondisplaced fracture of the right greater trochanter extending into the subtrochanteric femur. There is no hardware failure or complication. Muscles scratch at the muscles are normal. There is no fluid collection or hematoma. There is no muscle atrophy. There is peripheral vascular atherosclerotic disease. IMPRESSION: Total left hip arthroplasty with beam hardening artifact resulting from the arthroplasty partially obscuring the adjacent soft tissue and osseous structures. Acute nondisplaced fracture of the right greater trochanter extending into the subtrochanteric femur. Electronically Signed   By: Kathreen Devoid   On: 09/10/2015 13:17   Dg Hip Unilat With Pelvis 2-3 Views Right  09/10/2015  CLINICAL DATA:  82 year old male with history of  right-sided hip pain after fall. Unable to bear weight. EXAM: DG HIP (WITH OR WITHOUT PELVIS) 2-3V RIGHT COMPARISON:  No priors. FINDINGS: The bony pelvis is intact. Bilateral total hip arthroplasties are noted. Prostatic right femoral head is located. No periprosthetic fractures or other acute abnormalities are identified. There is asymmetric CT of the left prosthetic femoral head in the superior lateral aspect of the left acetabulum, suggesting some prosthetic  wear. IMPRESSION: 1. No acute osseous abnormality of the bony pelvis or the right hip. 2. Status post bilateral total hip arthroplasties, as above, with evidence of significant wear in the prosthetic left acetabular cup. Electronically Signed   By: Vinnie Langton M.D.   On: 09/10/2015 11:21    Positive ROS: All other systems have been reviewed and were otherwise negative with the exception of those mentioned in the HPI and as above.  Physical Exam: General: Alert, no acute distress Cardiovascular: No pedal edema Respiratory: No cyanosis, no use of accessory musculature GI: No organomegaly, abdomen is soft and non-tender Skin: No lesions in the area of chief complaint Neurologic: Sensation intact distally Psychiatric: Patient is competent for consent with normal mood and affect Lymphatic: No axillary or cervical lymphadenopathy  MUSCULOSKELETAL: The patient is awake and alert and sitting in a chair.  He is tender over the right hip laterally.  Gentle hip motion is satisfactory.  Neurovascular status good distally except for a drop foot on the right, which from prior back injury.  Assessment: Nondisplaced right greater trochanteric hip fracture  Plan: Protected weightbearing and pain control. Follow-up with Dr. Marry Guan in 7-10 days.    Park Breed, MD [phone]   09/11/2015 1:18 PM

## 2015-09-11 NOTE — Care Management Note (Signed)
Case Management Note  Patient Details  Name: CALLAN REATEGUI MRN: [id number] Date of Birth: 1929-12-23  Subjective/Objective:      82yo Mr Maynard Pinta was admitted 09/10/15 after he fell at home. Dx: Non-displaced right greater trochanter hip fracture. Received bilateral total hip replacements by Dr Marry Guan years ago. Resides at home with his wife. Uses a rolling walker PRN at home. Admitted for pain control and non-weightbearing until he can be accepted at a rehab. SW is following for placement.               Action/Plan:   Expected Discharge Date:  09/13/15               Expected Discharge Plan:     In-House Referral:     Discharge planning Services     Post Acute Care Choice:    Choice offered to:     DME Arranged:    DME Agency:     HH Arranged:    HH Agency:     Status of Service:     Medicare Important Message Given:    Date Medicare IM Given:    Medicare IM give by:    Date Additional Medicare IM Given:    Additional Medicare Important Message give by:     If discussed at [place] of Stay Meetings, dates discussed:    Additional Comments:  Jezabelle Chisolm A, RN 09/11/2015, 2:15 PM

## 2015-09-11 NOTE — Clinical Social Work Note (Signed)
Clinical Social Work Assessment  Patient Details  Name: Brian Barry MRN: [id number] Date of Birth: 01/25/1930  Date of referral:  09/11/15               Reason for consult:  Facility Placement                Permission sought to share information with:  Family Supports, Customer service manager Permission granted to share information::  Yes, Verbal Permission Granted  Name::     Brian Barry ( wife)  [phone]  Agency::  yes  Relationship::  yes  Contact Information:  yes  Housing/Transportation Living arrangements for the past 2 months:  Single Family Home Source of Information:  Patient, Spouse, Adult Children Patient Interpreter Needed:  None Criminal Activity/Legal Involvement Pertinent to Current Situation/Hospitalization:  No - Comment as needed Significant Relationships:  Adult Children, Church, Spouse Lives with:  Spouse, Pets (Dog named Sophie) Do you feel safe going back to the place where you live?  Yes Need for family participation in patient care:  Yes (Comment)  Care giving concerns: none   Social Worker assessment / plan: LCSW met with patient and his wife Brian Barry and one of their daughters. All parties are agreeable to short term rehab. Patient has fractured his right hip. He is continent but uses urinal for now. In the evening at home he uses a walker. He lives with his wife Brian Barry and dog named Sophie on a one level house ( 4 steps at entrances)Patient is oriented x3 and is diabetic and requires a diabetic diet. Patient has given verbal consent to speak to family and facilities. He has a preference for [facility] as he has been there before. Employment status:  Retired Forensic psychologist PT Recommendations:  Not assessed at this time Information / Referral to community resources:  [facility]  Patient/Family's Response to care:  Excellent care   Patient/Family's Understanding of and Emotional Response to  Diagnosis, Current Treatment, and Prognosis: patient and family agree rehab will be essential for pts recovery  Emotional Assessment Appearance:  Appears stated age Attitude/Demeanor/Rapport:   (Pleasant and cooperative) Affect (typically observed):  Accepting, Appropriate, Happy Orientation:  Oriented to Self, Oriented to Place, Oriented to  Time, Oriented to Situation Alcohol / Substance use:  Never Used Psych involvement (Current and /or in the community):  No (Comment)  Discharge Needs  Concerns to be addressed:  No discharge needs identified Readmission within the last 30 days:  No Current discharge risk:  None Barriers to Discharge:  No Barriers Identified   Joana Reamer, LCSW 09/11/2015, 2:02 PM

## 2015-09-11 NOTE — Progress Notes (Signed)
Physical Therapy Evaluation Patient Details Name: Brian Barry MRN: [id number] DOB: 12/08/1929 Today's Date: 09/11/2015   History of Present Illness  Patient is an 82 y.o. male admitted on 10 September 2015 after sustaining fall with non-displaced fx of R greater trochanter. Patient had hip replacement 20 years ago and hardware still intact. PMH includes DM, HTN, hyperlipidemia, and 2 TIAs.   Clinical Impression  Patient is a previously modified independent male with household ambulation with reported balance deficits at baseline. Upon evaluation, patient demonstrates decreased ability to perform sit to stand and stand pivot transfers due to increased pain and inability to lift R LE. Was able to perform after active assisted marching in standing performed, allowing him to take small steps and transfer. Patient now also has impairments in ROM and muscle strength as well. Because patient is not at baseline level of function and has hx of fall from balance deficits, it is believed that he will continue to benefit from skilled and progressive PT both in and out of the hospital. Patient is agreeable to SNF and prefers [facility].    Follow Up Recommendations SNF    Equipment Recommendations  None recommended by PT    Recommendations for Other Services       Precautions / Restrictions Precautions Precautions: Fall Restrictions Weight Bearing Restrictions: Yes (WBAT)      Mobility  Bed Mobility Overal bed mobility: Needs Assistance Bed Mobility: Supine to Sit     Supine to sit: Mod assist     General bed mobility comments: Patient requires moderate assistance coming to EOB. Able to scoot and use UEs to push/pull weight.  Transfers Overall transfer level: Needs assistance Equipment used: Rolling walker (2 wheeled) Transfers: Sit to/from [facility] Sit to Stand: Min assist Stand pivot transfers: Mod assist       General transfer comment: Patient requires moderate  assistance with RW and balance when performing stand pivot transfer to chair. Takes small steps, showing decreased ability to weightshift.  Ambulation/Gait                Stairs            Wheelchair Mobility    Modified Rankin (Stroke Patients Only)       Balance Overall balance assessment: Needs assistance;History of Falls Sitting-balance support: Feet supported;Single extremity supported Sitting balance-Leahy Scale: Good     Standing balance support: Bilateral upper extremity supported Standing balance-Leahy Scale: Poor Standing balance comment: Patient with increased forward lean Single Leg Stance - Right Leg:  (Poor due to pain) Single Leg Stance - Left Leg:  (Fair)                         Pertinent Vitals/Pain Pain Assessment: 0-10 Pain Score: 5  Pain Location: R hip Pain Descriptors / Indicators: Aching Pain Intervention(s): Limited activity within patient's tolerance;Monitored during session;Premedicated before session;Ice applied    Home Living Family/patient expects to be discharged to:: Private residence Living Arrangements: Spouse/significant other Available Help at Discharge: Family;Available PRN/intermittently Type of Home: House Home Access: Stairs to enter Entrance Stairs-Rails: Right Entrance Stairs-Number of Steps: 4 Home Layout: Two level;Able to live on main level with bedroom/bathroom Home Equipment: Gilford Rile - 2 wheels;Shower seat;Grab bars - tub/shower;Grab bars - toilet      Prior Function Level of Independence: Needs assistance   Gait / Transfers Assistance Needed: Modified independent with AD  ADL's / Homemaking Assistance Needed: Performed by wife  Hand Dominance        Extremity/Trunk Assessment   Upper Extremity Assessment: Overall WFL for tasks assessed           Lower Extremity Assessment: Generalized weakness;RLE deficits/detail RLE Deficits / Details: Decreased A/PROM due to pain; 4/5 MMT,  limited by pain; hx of drop foot on R       Communication   Communication: No difficulties  Cognition Arousal/Alertness: Awake/alert Behavior During Therapy: WFL for tasks assessed/performed Overall Cognitive Status: Within Functional Limits for tasks assessed                      General Comments      Exercises Other Exercises Other Exercises: Weightshifting in standing; marching in standing AAROM/AROM      Assessment/Plan    PT Assessment Patient needs continued PT services  PT Diagnosis Difficulty walking;Generalized weakness;Acute pain   PT Problem List Decreased strength;Decreased range of motion;Decreased activity tolerance;Decreased balance;Decreased mobility;Decreased knowledge of use of DME;Decreased safety awareness;Decreased skin integrity  PT Treatment Interventions Gait training;Stair training;Functional mobility training;Therapeutic activities;Therapeutic exercise;Balance training;Patient/family education   PT Goals (Current goals can be found in the Care Plan section) Acute Rehab PT Goals Patient Stated Goal: "To get better" PT Goal Formulation: With patient/family Time For Goal Achievement: 09/25/15 Potential to Achieve Goals: Good    Frequency BID   Barriers to discharge Decreased caregiver support;Inaccessible home environment      Co-evaluation               End of Session Equipment Utilized During Treatment: Gait belt Activity Tolerance: Patient limited by pain Patient left: in chair;with call bell/phone within reach;with chair alarm set;with family/visitor present Nurse Communication: Mobility status         Time: 1010-1050 PT Time Calculation (min) (ACUTE ONLY): 40 min   Charges:   PT Evaluation $PT Eval Low Complexity: 1 Procedure     PT G Codes:        Dorice Lamas, PT, DPT 09/11/2015, 11:48 AM

## 2015-09-11 NOTE — Progress Notes (Signed)
LCSW completed Fl2, assessment and obtained passr number and faxed out to SNF facilities [facility] for short term rehab. Awaiting offers.  [employer] LCSW [phone]

## 2015-09-11 NOTE — Progress Notes (Signed)
Patient ID: Brian Barry, male   DOB: 1929-07-13, 82 y.o.   MRN: [id number] [facility] PROGRESS NOTE  Brian Barry [id number] DOB: March 08, 1930 DOA: 09/10/2015 PCP: No primary care provider on file.  HPI/Subjective: Patient with pain especially when trying to move his right hip. 5 out of 10 intensity at rest more with movement.  Objective: Filed Vitals:   09/11/15 0719 09/11/15 1120  BP: 125/54 144/62  Pulse: 64 78  Temp: 98.5 F (36.9 C) 98.5 F (36.9 C)  Resp: 18 18    Filed Weights   09/10/15 1047  Weight: 70.308 kg (155 lb)    ROS: Review of Systems  Constitutional: Negative for fever and chills.  Eyes: Negative for blurred vision.  Respiratory: Negative for cough and shortness of breath.   Cardiovascular: Negative for chest pain.  Gastrointestinal: Negative for nausea, vomiting, abdominal pain, diarrhea and constipation.  Genitourinary: Negative for dysuria.  Musculoskeletal: Positive for joint pain.  Neurological: Negative for dizziness and headaches.   Exam: Physical Exam  Constitutional: He is oriented to person, place, and time.  HENT:  Nose: No mucosal edema.  Mouth/Throat: No oropharyngeal exudate or posterior oropharyngeal edema.  Eyes: Conjunctivae, EOM and lids are normal. Pupils are equal, round, and reactive to light.  Neck: No JVD present. Carotid bruit is not present. No edema present. No thyroid mass and no thyromegaly present.  Cardiovascular: S1 normal and S2 normal.  Exam reveals no gallop.   No murmur heard. Pulses:      Dorsalis pedis pulses are 2+ on the right side, and 2+ on the left side.  Respiratory: No respiratory distress. He has no wheezes. He has no rhonchi. He has no rales.  GI: Soft. Bowel sounds are normal. There is no tenderness.  Musculoskeletal:       Right ankle: He exhibits no swelling.       Left ankle: He exhibits no swelling.  Lymphadenopathy:    He has no cervical adenopathy.  Neurological: He is alert and  oriented to person, place, and time. No cranial nerve deficit.  Sensation intact bilateral lower extremities. Patient able to move his ankles bilaterally. Patient actually able to lift his right leg barely off the chair.  Skin: Skin is warm. No rash noted. Nails show no clubbing.  Psychiatric: He has a normal mood and affect.      Data Reviewed: Basic Metabolic Panel:  Recent Labs Lab 09/10/15 1509 09/11/15 0323  NA 135 137  K 4.3 3.8  CL 99* 102  CO2 27 26  GLUCOSE 216* 159*  BUN 16 13  CREATININE 0.82 0.72  CALCIUM 9.1 8.6*   Liver Function Tests:  Recent Labs Lab 09/11/15 0323  AST 21  ALT 14*  ALKPHOS 52  BILITOT 0.8  PROT 5.6*  ALBUMIN 3.6   CBC:  Recent Labs Lab 09/10/15 1509 09/11/15 0323  WBC 10.7* 6.5  HGB 12.0* 11.1*  HCT 35.0* 32.6*  MCV 92.7 94.5  PLT 166 147*    CBG:  Recent Labs Lab 09/10/15 1605 09/10/15 2123 09/11/15 0720  GLUCAP 190* 286* 135*      Studies: Ct Hip Right Wo Contrast  09/10/2015  CLINICAL DATA:  Landed on the right hip with subsequent pain. EXAM: CT OF THE RIGHT HIP WITHOUT CONTRAST TECHNIQUE: Multidetector CT imaging of the right hip was performed according to the standard protocol. Multiplanar CT image reconstructions were also generated. COMPARISON:  None. FINDINGS: There is a total left hip arthroplasty with beam  hardening artifact resulting from the arthroplasty partially obscuring the adjacent soft tissue and osseous structures. There is an acute nondisplaced fracture of the right greater trochanter extending into the subtrochanteric femur. There is no hardware failure or complication. Muscles scratch at the muscles are normal. There is no fluid collection or hematoma. There is no muscle atrophy. There is peripheral vascular atherosclerotic disease. IMPRESSION: Total left hip arthroplasty with beam hardening artifact resulting from the arthroplasty partially obscuring the adjacent soft tissue and osseous structures.  Acute nondisplaced fracture of the right greater trochanter extending into the subtrochanteric femur. Electronically Signed   By: Kathreen Devoid   On: 09/10/2015 13:17   Dg Hip Unilat With Pelvis 2-3 Views Right  09/10/2015  CLINICAL DATA:  82 year old male with history of right-sided hip pain after fall. Unable to bear weight. EXAM: DG HIP (WITH OR WITHOUT PELVIS) 2-3V RIGHT COMPARISON:  No priors. FINDINGS: The bony pelvis is intact. Bilateral total hip arthroplasties are noted. Prostatic right femoral head is located. No periprosthetic fractures or other acute abnormalities are identified. There is asymmetric CT of the left prosthetic femoral head in the superior lateral aspect of the left acetabulum, suggesting some prosthetic wear. IMPRESSION: 1. No acute osseous abnormality of the bony pelvis or the right hip. 2. Status post bilateral total hip arthroplasties, as above, with evidence of significant wear in the prosthetic left acetabular cup. Electronically Signed   By: Vinnie Langton M.D.   On: 09/10/2015 11:21    Scheduled Meds: . aspirin EC  81 mg Oral Daily  . clopidogrel  75 mg Oral Daily  . docusate sodium  100 mg Oral BID  . enoxaparin (LOVENOX) injection  40 mg Subcutaneous Q24H  . finasteride  5 mg Oral Daily  . insulin glargine  18 Units Subcutaneous Daily  . lisinopril  20 mg Oral Daily  . metFORMIN  1,000 mg Oral BID WC  . polyethylene glycol  17 g Oral Daily  . simvastatin  20 mg Oral QHS  . sodium chloride flush  3 mL Intravenous Q12H  . sodium chloride flush  3 mL Intravenous Q12H  . tamsulosin  0.4 mg Oral BID    Assessment/Plan:  1. Acute nondisplaced fracture of the right greater trochanter. As per orthopedic surgery Dr. Sabra Heck, medical management. Pain control with oral and IV medications. Patient will need rehabilitation. Social worker consultation 2. Essential hypertension continue usual medications 3. Type 2 diabetes requiring insulin continue Lantus and  metformin 4. Hyperlipidemia unspecified continue simvastatin 5. History of TIA on aspirin and Plavix 6. BPH on Flomax  Code Status:     Code Status Orders        Start     Ordered   09/10/15 1605  Full code   Continuous     09/10/15 1604    Code Status History    Date Active Date Inactive Code Status Order ID Comments User Context   This patient has a current code status but no historical code status.    Advance Directive Documentation        Most Recent Value   Type of Advance Directive  Healthcare Power of Attorney, Living will   Pre-existing out of facility DNR order (yellow form or pink MOST form)     "MOST" Form in Place?       Family Communication: Wife and daughters at bedside Disposition Plan: Out to rehabilitation soon  Consultants:  Orthopedic surgery  Time spent: 25 minutes  [place], [place]  [facility]

## 2015-09-11 NOTE — NC FL2 (Signed)
[place] LEVEL OF CARE SCREENING TOOL     IDENTIFICATION  Patient Name: Brian Barry Birthdate: [birth year] Sex: male Admission Date (Current Location): 09/10/2015  [place] and [place] Number:  Engineering geologist and Address:  [facility], [address] [place], [place] [address]      Provider Number: [id number]  Attending Physician Name and Address:  Loletha Grayer, MD  Relative Name and Phone Number:       Current Level of Care: Hospital Recommended Level of Care: [place] Prior Approval Number:    Date Approved/Denied:   PASRR Number:   [id number]   Discharge Plan: SNF    Current Diagnoses: Patient Active Problem List   Diagnosis Date Noted  . Hip fracture ([place]) 09/10/2015    Orientation RESPIRATION BLADDER Height & Weight     Self, Time, Situation, Place  Normal Continent Weight: 155 lb (70.308 kg) Height:  5\' 9"  (175.3 cm)  BEHAVIORAL SYMPTOMS/MOOD NEUROLOGICAL BOWEL NUTRITION STATUS      Continent Diet (Diabetic)  AMBULATORY STATUS COMMUNICATION OF NEEDS Skin   Extensive Assist Verbally Normal                       Personal Care Assistance Level of Assistance  Bathing, Feeding, Dressing, Total care Bathing Assistance: Maximum assistance Feeding assistance: Maximum assistance Dressing Assistance: Maximum assistance Total Care Assistance: Maximum assistance   Functional Limitations Info  Sight, Hearing, Speech Sight Info: Adequate Hearing Info: Adequate Speech Info: Adequate    SPECIAL CARE FACTORS FREQUENCY  PT (By licensed PT)     PT Frequency: 5x              Contractures      Additional Factors Info  Code Status Code Status Info: Full              Current Medications (09/11/2015):  This is the current hospital active medication list Current Facility-Administered Medications  Medication Dose Route Frequency Provider Last Rate Last Dose  . 0.9 %  sodium  chloride infusion  250 mL Intravenous PRN Nicholes Mango, MD      . acetaminophen (TYLENOL) tablet 650 mg  650 mg Oral Q6H PRN Nicholes Mango, MD       Or  . acetaminophen (TYLENOL) suppository 650 mg  650 mg Rectal Q6H PRN Nicholes Mango, MD      . aspirin EC tablet 81 mg  81 mg Oral Daily Nicholes Mango, MD   81 mg at 09/11/15 0824  . clopidogrel (PLAVIX) tablet 75 mg  75 mg Oral Daily Nicholes Mango, MD   75 mg at 09/11/15 0823  . docusate sodium (COLACE) capsule 100 mg  100 mg Oral BID Nicholes Mango, MD   100 mg at 09/11/15 1013  . enoxaparin (LOVENOX) injection 40 mg  40 mg Subcutaneous Q24H Nicholes Mango, MD   40 mg at 09/10/15 2133  . finasteride (PROSCAR) tablet 5 mg  5 mg Oral Daily Nicholes Mango, MD   5 mg at 09/11/15 0824  . insulin glargine (LANTUS) injection 18 Units  18 Units Subcutaneous Daily Nicholes Mango, MD   18 Units at 09/11/15 1009  . lisinopril (PRINIVIL,ZESTRIL) tablet 20 mg  20 mg Oral Daily Nicholes Mango, MD   20 mg at 09/11/15 0823  . metFORMIN (GLUCOPHAGE) tablet 1,000 mg  1,000 mg Oral BID WC Nicholes Mango, MD   1,000 mg at 09/11/15 [id number]  . morphine 2 MG/ML injection 2 mg  2 mg Intravenous Q4H PRN Nicholes Mango, MD   2 mg at 09/11/15 1023  . ondansetron (ZOFRAN) tablet 4 mg  4 mg Oral Q6H PRN Nicholes Mango, MD       Or  . ondansetron (ZOFRAN) injection 4 mg  4 mg Intravenous Q6H PRN Nicholes Mango, MD      . oxyCODONE (Oxy IR/ROXICODONE) immediate release tablet 5 mg  5 mg Oral Q4H PRN Nicholes Mango, MD   5 mg at 09/11/15 1233  . polyethylene glycol (MIRALAX / GLYCOLAX) packet 17 g  17 g Oral Daily Loletha Grayer, MD      . senna-docusate (Senokot-S) tablet 1 tablet  1 tablet Oral QHS PRN Nicholes Mango, MD      . simvastatin (ZOCOR) tablet 20 mg  20 mg Oral QHS Nicholes Mango, MD   20 mg at 09/10/15 2133  . sodium chloride flush (NS) 0.9 % injection 3 mL  3 mL Intravenous Q12H Nicholes Mango, MD   3 mL at 09/10/15 2213  . sodium chloride flush (NS) 0.9 % injection 3 mL  3 mL Intravenous Q12H Nicholes Mango, MD    3 mL at 09/10/15 2213  . sodium chloride flush (NS) 0.9 % injection 3 mL  3 mL Intravenous PRN Nicholes Mango, MD      . tamsulosin (FLOMAX) capsule 0.4 mg  0.4 mg Oral BID Nicholes Mango, MD   0.4 mg at 09/11/15 [id number]     Discharge Medications: Please see discharge summary for a list of discharge medications.  Relevant Imaging Results:  Relevant Lab Results:   Additional Information SSN # [id number]  Onis Markoff, [place], [place]

## 2015-09-12 ENCOUNTER — Encounter
Admission: RE | Admit: 2015-09-12 | Discharge: 2015-09-12 | Disposition: A | Discharge status: Home / Self Care | Payer: Commercial Managed Care - HMO | Source: Ambulatory Visit | Attending: Internal Medicine | Admitting: Internal Medicine

## 2015-09-12 DIAGNOSIS — E119 Type 2 diabetes mellitus without complications: Secondary | ICD-10-CM | POA: Diagnosis not present

## 2015-09-12 DIAGNOSIS — Z9181 History of falling: Secondary | ICD-10-CM | POA: Diagnosis not present

## 2015-09-12 DIAGNOSIS — R2689 Other abnormalities of gait and mobility: Secondary | ICD-10-CM | POA: Diagnosis not present

## 2015-09-12 DIAGNOSIS — S72001D Fracture of unspecified part of neck of right femur, subsequent encounter for closed fracture with routine healing: Secondary | ICD-10-CM | POA: Diagnosis not present

## 2015-09-12 DIAGNOSIS — M6281 Muscle weakness (generalized): Secondary | ICD-10-CM | POA: Diagnosis not present

## 2015-09-12 DIAGNOSIS — N138 Other obstructive and reflux uropathy: Secondary | ICD-10-CM | POA: Diagnosis not present

## 2015-09-12 DIAGNOSIS — S72009A Fracture of unspecified part of neck of unspecified femur, initial encounter for closed fracture: Secondary | ICD-10-CM | POA: Diagnosis not present

## 2015-09-12 DIAGNOSIS — M810 Age-related osteoporosis without current pathological fracture: Secondary | ICD-10-CM | POA: Diagnosis not present

## 2015-09-12 DIAGNOSIS — I10 Essential (primary) hypertension: Secondary | ICD-10-CM | POA: Diagnosis not present

## 2015-09-12 DIAGNOSIS — R293 Abnormal posture: Secondary | ICD-10-CM | POA: Diagnosis not present

## 2015-09-12 DIAGNOSIS — M25551 Pain in right hip: Secondary | ICD-10-CM | POA: Diagnosis not present

## 2015-09-12 DIAGNOSIS — N401 Enlarged prostate with lower urinary tract symptoms: Secondary | ICD-10-CM | POA: Diagnosis not present

## 2015-09-12 DIAGNOSIS — E785 Hyperlipidemia, unspecified: Secondary | ICD-10-CM | POA: Diagnosis not present

## 2015-09-12 LAB — GLUCOSE, CAPILLARY
GLUCOSE-CAPILLARY: 136 mg/dL — AB (ref 65–99)
Glucose-Capillary: 181 mg/dL — ABNORMAL HIGH (ref 65–99)

## 2015-09-12 MED ORDER — ENOXAPARIN SODIUM 40 MG/0.4ML ~~LOC~~ SOLN: 40.0000 mg | SUBCUTANEOUS | Status: DC

## 2015-09-12 MED ORDER — SENNOSIDES-DOCUSATE SODIUM 8.6-50 MG PO TABS: 1.0000 | ORAL_TABLET | Freq: Every evening | ORAL | Status: AC | PRN

## 2015-09-12 MED ORDER — POLYETHYLENE GLYCOL 3350 17 G PO PACK: 17.0000 g | PACK | Freq: Every day | ORAL | Status: AC

## 2015-09-12 MED ORDER — OXYCODONE HCL 5 MG PO TABS: 5.0000 mg | ORAL_TABLET | ORAL | Status: DC | PRN

## 2015-09-12 NOTE — NC FL2 (Signed)
[place] LEVEL OF CARE SCREENING TOOL     IDENTIFICATION  Patient Name: Brian Barry Birthdate: 16-Jan-1930 Sex: male Admission Date (Current Location): 09/10/2015  [facility] and [place] Number:  Engineering geologist and Address:  [facility], [address] [place], [place] [address]      Provider Number: [id number]  Attending Physician Name and Address:  Loletha Grayer, MD  Relative Name and Phone Number:       Current Level of Care: Hospital Recommended Level of Care: [place] Prior Approval Number:    Date Approved/Denied:   PASRR Number:    Discharge Plan: SNF    Current Diagnoses: Patient Active Problem List   Diagnosis Date Noted  . Hip fracture ([place]) 09/10/2015    Orientation RESPIRATION BLADDER Height & Weight     Self, Time, Situation, Place  Normal Continent Weight: 155 lb (70.308 kg) Height:  5\' 9"  (175.3 cm)  BEHAVIORAL SYMPTOMS/MOOD NEUROLOGICAL BOWEL NUTRITION STATUS      Continent Diet (Diabetic)  AMBULATORY STATUS COMMUNICATION OF NEEDS Skin   Extensive Assist Verbally Normal                       Personal Care Assistance Level of Assistance  Bathing, Feeding, Dressing, Total care Bathing Assistance: Maximum assistance Feeding assistance: Maximum assistance Dressing Assistance: Maximum assistance Total Care Assistance: Maximum assistance   Functional Limitations Info  Sight, Hearing, Speech Sight Info: Adequate Hearing Info: Adequate Speech Info: Adequate    SPECIAL CARE FACTORS FREQUENCY  PT (By licensed PT)     PT Frequency: 5x              Contractures      Additional Factors Info  Code Status Code Status Info: Full              Current Medications (09/12/2015):  This is the current hospital active medication list Current Facility-Administered Medications  Medication Dose Route Frequency Provider Last Rate Last Dose  . 0.9 %  sodium chloride infusion   250 mL Intravenous PRN Nicholes Mango, MD      . acetaminophen (TYLENOL) tablet 650 mg  650 mg Oral Q6H PRN Nicholes Mango, MD       Or  . acetaminophen (TYLENOL) suppository 650 mg  650 mg Rectal Q6H PRN Nicholes Mango, MD      . aspirin EC tablet 81 mg  81 mg Oral Daily Nicholes Mango, MD   81 mg at 09/11/15 0824  . clopidogrel (PLAVIX) tablet 75 mg  75 mg Oral Daily Nicholes Mango, MD   75 mg at 09/12/15 0843  . docusate sodium (COLACE) capsule 100 mg  100 mg Oral BID Nicholes Mango, MD   100 mg at 09/12/15 0841  . enoxaparin (LOVENOX) injection 40 mg  40 mg Subcutaneous Q24H Nicholes Mango, MD   40 mg at 09/11/15 2103  . finasteride (PROSCAR) tablet 5 mg  5 mg Oral Daily Nicholes Mango, MD   5 mg at 09/12/15 0841  . insulin glargine (LANTUS) injection 18 Units  18 Units Subcutaneous Daily Nicholes Mango, MD   18 Units at 09/12/15 0844  . lisinopril (PRINIVIL,ZESTRIL) tablet 20 mg  20 mg Oral Daily Nicholes Mango, MD   20 mg at 09/12/15 0841  . metFORMIN (GLUCOPHAGE) tablet 1,000 mg  1,000 mg Oral BID WC Nicholes Mango, MD   1,000 mg at 09/12/15 0841  . morphine 2 MG/ML injection 2 mg  2  mg Intravenous Q4H PRN Nicholes Mango, MD   2 mg at 09/11/15 1023  . ondansetron (ZOFRAN) tablet 4 mg  4 mg Oral Q6H PRN Nicholes Mango, MD       Or  . ondansetron (ZOFRAN) injection 4 mg  4 mg Intravenous Q6H PRN Nicholes Mango, MD      . oxyCODONE (Oxy IR/ROXICODONE) immediate release tablet 5 mg  5 mg Oral Q4H PRN Nicholes Mango, MD   5 mg at 09/12/15 [id number]  . polyethylene glycol (MIRALAX / GLYCOLAX) packet 17 g  17 g Oral Daily Loletha Grayer, MD   17 g at 09/12/15 0841  . senna-docusate (Senokot-S) tablet 1 tablet  1 tablet Oral QHS PRN Nicholes Mango, MD      . simvastatin (ZOCOR) tablet 20 mg  20 mg Oral QHS Nicholes Mango, MD   20 mg at 09/11/15 2103  . sodium chloride flush (NS) 0.9 % injection 3 mL  3 mL Intravenous Q12H Nicholes Mango, MD   3 mL at 09/10/15 2213  . sodium chloride flush (NS) 0.9 % injection 3 mL  3 mL Intravenous Q12H Nicholes Mango, MD    3 mL at 09/12/15 0845  . sodium chloride flush (NS) 0.9 % injection 3 mL  3 mL Intravenous PRN Nicholes Mango, MD      . tamsulosin (FLOMAX) capsule 0.4 mg  0.4 mg Oral BID Nicholes Mango, MD   0.4 mg at 09/12/15 0841     Discharge Medications: Please see discharge summary for a list of discharge medications.  Relevant Imaging Results:  Relevant Lab Results:   Additional Information SSN # [id number]  Brian Barry, [place]

## 2015-09-12 NOTE — Clinical Social Work Placement (Signed)
   CLINICAL SOCIAL WORK PLACEMENT  NOTE  Date:  09/12/2015  Patient Details  Name: ORI BOLEJACK MRN: [id number] Date of Birth: 1929/09/08  Clinical Social Work is seeking post-discharge placement for this patient at the [place] level of care (*CSW will initial, date and re-position this form in  chart as items are completed):  Yes   Patient/family provided with [place] Work Department's list of facilities offering this level of care within the geographic area requested by the patient (or if unable, by the patient's family).  Yes   Patient/family informed of their freedom to choose among providers that offer the needed level of care, that participate in Medicare, Medicaid or managed care program needed by the patient, have an available bed and are willing to accept the patient.  Yes   Patient/family informed of [place]'s ownership interest in [facility] and [facility], as well as of the fact that they are under no obligation to receive care at these facilities.  PASRR submitted to EDS on 09/11/15     PASRR number received on 09/11/15     Existing PASRR number confirmed on       FL2 transmitted to all facilities in geographic area requested by pt/family on 09/11/15     FL2 transmitted to all facilities within larger geographic area on       Patient informed that his/her managed care company has contracts with or will negotiate with certain facilities, including the following:        Yes   Patient/family informed of bed offers received.  Patient chooses bed at  [facility] )     Physician recommends and patient chooses bed at      Patient to be transferred to   on  .  Patient to be transferred to facility by       Patient family notified on   of transfer.  Name of family member notified:        PHYSICIAN       Additional Comment:    _______________________________________________ Loralyn Freshwater, LCSW 09/12/2015,  9:39 AM

## 2015-09-12 NOTE — Progress Notes (Signed)
Clinical Education officer, museum (CSW) met with patient and his wife [name] and daughter were at bedside. CSW presented bed offers. Patient and wife chose semi-private room at [facility]. [facility] case manager is aware of above. Patient can D/C today if medically cleared. CSW will continue to follow and assist as needed.   Blima Rich, LCSW [phone]

## 2015-09-12 NOTE — Care Management Important Message (Signed)
Important Message  Patient Details  Name: ABUBAKR JAMERSON MRN: [id number] Date of Birth: [birth year]   Medicare Important Message Given:  Yes    Marshell Garfinkel, RN 09/12/2015, 8:33 AM

## 2015-09-12 NOTE — Progress Notes (Signed)
Physical Therapy Treatment Patient Details Name: Brian Barry MRN: [id number] DOB: 02-11-1930 Today's Date: 09/12/2015    History of Present Illness Patient is an 82 y.o. male admitted on 10 September 2015 after sustaining fall with non-displaced fx of R greater trochanter. Patient had hip replacement 20 years ago and hardware still intact. PMH includes DM, HTN, hyperlipidemia, and 2 TIAs.     PT Comments    Pt progressing. Demonstrated ability to take several steps bed to chair with assist and cueing for sequencing. Pt demonstrates improved understanding of weight shifting and use of bilateral upper extremities to de weight Right lower extremity. Pt demonstrates good understanding of all range of motion/strengthening exercises. Pt notes no significant increase in pain with session today. Pt has current discharge order to skilled nursing facility for continued work on all strengthening and mobility activities to improve functional mobility.   Follow Up Recommendations  SNF     Equipment Recommendations  None recommended by PT    Recommendations for Other Services       Precautions / Restrictions Precautions Precautions: Fall Restrictions Weight Bearing Restrictions: Yes    Mobility  Bed Mobility Overal bed mobility: Needs Assistance Bed Mobility: Supine to Sit     Supine to sit: Mod assist     General bed mobility comments: Assist for LEs and trunk;   Transfers Overall transfer level: Needs assistance Equipment used: Rolling walker (2 wheeled) Transfers: Sit to/from Stand Sit to Stand: Min assist         General transfer comment: Requires heavy cueing for quaq/glute activation and upright posture. Slow to rise  Ambulation/Gait Ambulation/Gait assistance: Min assist Ambulation Distance (Feet): 3 Feet Assistive device: Rolling walker (2 wheeled) Gait Pattern/deviations: Step-to pattern;Decreased step length - right;Decreased step length - left;Decreased stance time -  right;Decreased weight shift to right (decreased clearance L foot. ) Gait velocity: slow Gait velocity interpretation: <1.8 ft/sec, indicative of risk for recurrent falls General Gait Details: Requires cueing for sequence and min assist for balance, rw maneuvering and weight shifting through hands initially. Improves with continued steps   Stairs            Wheelchair Mobility    Modified Rankin (Stroke Patients Only)       Balance Overall balance assessment: Needs assistance Sitting-balance support: Feet supported;Bilateral upper extremity supported Sitting balance-Leahy Scale: Good     Standing balance support: Bilateral upper extremity supported Standing balance-Leahy Scale: Fair                      Cognition Arousal/Alertness: Awake/alert Behavior During Therapy: WFL for tasks assessed/performed Overall Cognitive Status: Within Functional Limits for tasks assessed                      Exercises General Exercises - Lower Extremity Ankle Circles/Pumps: AROM;Both;20 reps;Supine (pt notes chronic R foot drop; can DF to about neutral active) Quad Sets: Strengthening;Both;20 reps (long sit) Gluteal Sets: Strengthening;Both;20 reps (long sit) Long Arc Quad: Strengthening;Seated;AAROM;Right;20 reps (AROM L) Heel Slides: AAROM;Right;20 reps (long sit; AROM L) Hip ABduction/ADduction: AAROM;Right;20 reps (long sit; AROM L) Hip Flexion/Marching: AAROM;Right;20 reps;Seated (AROM L)    General Comments        Pertinent Vitals/Pain Pain Assessment: 0-10 Pain Score: 7  (with movement; 0 at rest) Pain Location: R hip Pain Intervention(s): Limited activity within patient's tolerance;Monitored during session;Premedicated before session;Repositioned    Home Living  Prior Function            PT Goals (current goals can now be found in the care plan section) Progress towards PT goals: Progressing toward goals     Frequency  Min 1X/week    PT Plan Current plan remains appropriate    Co-evaluation             End of Session Equipment Utilized During Treatment: Gait belt Activity Tolerance: Patient limited by pain;Patient limited by fatigue Patient left: in chair;with call bell/phone within reach;with nursing/sitter in room (nursing to bathe for transfer; will set alarm)     Time: 1025-1051 PT Time Calculation (min) (ACUTE ONLY): 26 min  Charges:  $Gait Training: 8-22 mins $Therapeutic Exercise: 8-22 mins                    G Codes:      Charlaine Dalton, PTA 09/12/2015, 11:25 AM

## 2015-09-12 NOTE — Clinical Social Work Placement (Signed)
   CLINICAL SOCIAL WORK PLACEMENT  NOTE  Date:  09/12/2015  Patient Details  Name: Brian Barry MRN: [id number] Date of Birth: 1929/06/09  Clinical Social Work is seeking post-discharge placement for this patient at the [place] level of care (*CSW will initial, date and re-position this form in  chart as items are completed):  Yes   Patient/family provided with [place] Work Department's list of facilities offering this level of care within the geographic area requested by the patient (or if unable, by the patient's family).  Yes   Patient/family informed of their freedom to choose among providers that offer the needed level of care, that participate in Medicare, Medicaid or managed care program needed by the patient, have an available bed and are willing to accept the patient.  Yes   Patient/family informed of [place]'s ownership interest in [facility] and [facility], as well as of the fact that they are under no obligation to receive care at these facilities.  PASRR submitted to EDS on 09/11/15     PASRR number received on 09/11/15     Existing PASRR number confirmed on       FL2 transmitted to all facilities in geographic area requested by pt/family on 09/11/15     FL2 transmitted to all facilities within larger geographic area on       Patient informed that his/her managed care company has contracts with or will negotiate with certain facilities, including the following:        Yes   Patient/family informed of bed offers received.  Patient chooses bed at  [facility] )     Physician recommends and patient chooses bed at      Patient to be transferred to  [facility] ) on 09/12/15.  Patient to be transferred to facility by  [facility] )     Patient family notified on 09/12/15 of transfer.  Name of family member notified:   (Patient's wife [name] is at bedside and aware of D/C today. )     PHYSICIAN       Additional Comment:    _______________________________________________ Loralyn Freshwater, LCSW 09/12/2015, 3:09 PM

## 2015-09-12 NOTE — Discharge Summary (Signed)
[facility] [place] NAME: Brian Barry    MR#:  [id number]  DATE OF BIRTH:  05/24/1929  DATE OF ADMISSION:  09/10/2015 ADMITTING PHYSICIAN: Nicholes Mango, MD  DATE OF DISCHARGE: 09/12/2015  PRIMARY CARE PHYSICIAN: No primary care provider on file.    ADMISSION DIAGNOSIS:  Closed right hip fracture, initial encounter ([place]) [S72.001A]  DISCHARGE DIAGNOSIS:  Active Problems:   Hip fracture (HCC)   SECONDARY DIAGNOSIS:  TIA Diabetes mellitus Essential hypertension Hyperlipidemia BPH  HOSPITAL COURSE:   1. Right hip fracture. Patient has a history of right hip replacement. Patient was seen in consultation by Dr. Earnestine Leys orthopedic surgery and he believe this is a nonoperative case. Patient was cleared to work with physical therapy with protected weightbearing right leg and pain control. Patient will be discharged to rehabilitation today. Oral oxycodone for pain control. I will give Lovenox 40 mg subcutaneous injection daily for at least 14 days or at least until more ambulatory to prevent DVT. 2. History of TIA on aspirin and Plavix 3. History of BPH without urinary obstruction on Flomax 4. Type 2 diabetes mellitus on Lantus and Glucophage 5. Essential hypertension- on lisinopril 6. Hyperlipidemia unspecified on simvastatin  DISCHARGE CONDITIONS:   Satisfactory  CONSULTS OBTAINED:  Treatment Team:  Earnestine Leys, MD  DRUG ALLERGIES:  No Known Allergies  DISCHARGE MEDICATIONS:   Current Discharge Medication List    START taking these medications   Details  enoxaparin (LOVENOX) 40 MG/0.4ML injection Inject 0.4 mLs (40 mg total) into the skin daily. Qty: 14 Syringe, Refills: 0    oxyCODONE (OXY IR/ROXICODONE) 5 MG immediate release tablet Take 1 tablet (5 mg total) by mouth every 4 (four) hours as needed for moderate pain. Qty: 30 tablet, Refills: 0    polyethylene glycol (MIRALAX / GLYCOLAX) packet Take 17 g by mouth  daily. Qty: 14 each, Refills: 0    senna-docusate (SENOKOT-S) 8.6-50 MG tablet Take 1 tablet by mouth at bedtime as needed for mild constipation. Qty: 30 tablet, Refills: 0      CONTINUE these medications which have NOT CHANGED   Details  aspirin EC 81 MG tablet Take 81 mg by mouth daily.    clopidogrel (PLAVIX) 75 MG tablet Take 75 mg by mouth daily.    finasteride (PROSCAR) 5 MG tablet Take 5 mg by mouth daily.    LANTUS SOLOSTAR 100 UNIT/ML Solostar Pen Inject 18 Units into the skin every morning.    lisinopril (PRINIVIL,ZESTRIL) 20 MG tablet Take 20 mg by mouth daily.    metFORMIN (GLUCOPHAGE) 500 MG tablet Take 1,000 mg by mouth 2 (two) times daily.    simvastatin (ZOCOR) 20 MG tablet Take 20 mg by mouth at bedtime.     tamsulosin (FLOMAX) 0.4 MG CAPS capsule Take 0.4 mg by mouth 2 (two) times daily.         DISCHARGE INSTRUCTIONS:   Follow-up Dr. rehabilitation one day  If you experience worsening of your admission symptoms, develop shortness of breath, life threatening emergency, suicidal or homicidal thoughts you must seek medical attention immediately by calling 911 or calling your MD immediately  if symptoms less severe.  You Must read complete instructions/literature along with all the possible adverse reactions/side effects for all the Medicines you take and that have been prescribed to you. Take any new Medicines after you have completely understood and accept all the possible adverse reactions/side effects.   Please note  You were cared for  by a hospitalist during your hospital stay. If you have any questions about your discharge medications or the care you received while you were in the hospital after you are discharged, you can call the unit and asked to speak with the hospitalist on call if the hospitalist that took care of you is not available. Once you are discharged, your primary care physician will handle any further medical issues. Please note that NO  REFILLS for any discharge medications will be authorized once you are discharged, as it is imperative that you return to your primary care physician (or establish a relationship with a primary care physician if you do not have one) for your aftercare needs so that they can reassess your need for medications and monitor your lab values.    Today   CHIEF COMPLAINT:   Chief Complaint  Patient presents with  . Hip Pain    HISTORY OF PRESENT ILLNESS:  Brian Barry  is a 82 y.o. male presented with hip pain and found to have a right hip fracture   VITAL SIGNS:  Blood pressure 165/67, pulse 71, temperature 98.2 F (36.8 C), temperature source Oral, resp. rate 18, height 5\' 9"  (1.753 m), weight 70.308 kg (155 lb), SpO2 97 %.    PHYSICAL EXAMINATION:  GENERAL:  54 y.o82-year-old patient lying in the bed with no acute distress.  EYES: Pupils equal, round, reactive to light and accommodation. No scleral icterus. Extraocular muscles intact.  HEENT: Head atraumatic, normocephalic. Oropharynx and nasopharynx clear.  NECK:  Supple, no jugular venous distention. No thyroid enlargement, no tenderness.  LUNGS: Normal breath sounds bilaterally, no wheezing, rales,rhonchi or crepitation. No use of accessory muscles of respiration.  CARDIOVASCULAR: S1, S2 normal. No murmurs, rubs, or gallops.  ABDOMEN: Soft, non-tender, non-distended. Bowel sounds present. No organomegaly or mass.  EXTREMITIES: No pedal edema, cyanosis, or clubbing.  NEUROLOGIC: Cranial nerves II through XII are intact. Muscle strength 5/5 in all extremities. Sensation intact. Gait not checked.  PSYCHIATRIC: The patient is alert and oriented x 3.  SKIN: No obvious rash, lesion, or ulcer.   DATA REVIEW:   CBC  Recent Labs Lab 09/11/15 0323  WBC 6.5  HGB 11.1*  HCT 32.6*  PLT 147*    Chemistries   Recent Labs Lab 09/11/15 0323  NA 137  K 3.8  CL 102  CO2 26  GLUCOSE 159*  BUN 13  CREATININE 0.72  CALCIUM 8.6*   AST 21  ALT 14*  ALKPHOS 52  BILITOT 0.8     RADIOLOGY:  Ct Hip Right Wo Contrast  09/10/2015  CLINICAL DATA:  Landed on the right hip with subsequent pain. EXAM: CT OF THE RIGHT HIP WITHOUT CONTRAST TECHNIQUE: Multidetector CT imaging of the right hip was performed according to the standard protocol. Multiplanar CT image reconstructions were also generated. COMPARISON:  None. FINDINGS: There is a total left hip arthroplasty with beam hardening artifact resulting from the arthroplasty partially obscuring the adjacent soft tissue and osseous structures. There is an acute nondisplaced fracture of the right greater trochanter extending into the subtrochanteric femur. There is no hardware failure or complication. Muscles scratch at the muscles are normal. There is no fluid collection or hematoma. There is no muscle atrophy. There is peripheral vascular atherosclerotic disease. IMPRESSION: Total left hip arthroplasty with beam hardening artifact resulting from the arthroplasty partially obscuring the adjacent soft tissue and osseous structures. Acute nondisplaced fracture of the right greater trochanter extending into the subtrochanteric femur. Electronically Signed  By: Kathreen Devoid   On: 09/10/2015 13:17   Dg Hip Unilat With Pelvis 2-3 Views Right  09/10/2015  CLINICAL DATA:  82 year old male with history of right-sided hip pain after fall. Unable to bear weight. EXAM: DG HIP (WITH OR WITHOUT PELVIS) 2-3V RIGHT COMPARISON:  No priors. FINDINGS: The bony pelvis is intact. Bilateral total hip arthroplasties are noted. Prostatic right femoral head is located. No periprosthetic fractures or other acute abnormalities are identified. There is asymmetric CT of the left prosthetic femoral head in the superior lateral aspect of the left acetabulum, suggesting some prosthetic wear. IMPRESSION: 1. No acute osseous abnormality of the bony pelvis or the right hip. 2. Status post bilateral total hip arthroplasties, as  above, with evidence of significant wear in the prosthetic left acetabular cup. Electronically Signed   By: Vinnie Langton M.D.   On: 09/10/2015 11:21    Management plans discussed with the patient, and he is  in agreement. Spoke with patient's wife and daughters yesterday.   CODE STATUS:     Code Status Orders        Start     Ordered   09/10/15 1605  Full code   Continuous     09/10/15 1604    Code Status History    Date Active Date Inactive Code Status Order ID Comments User Context   This patient has a current code status but no historical code status.    Advance Directive Documentation        Most Recent Value   Type of Advance Directive  Healthcare Power of Attorney, Living will   Pre-existing out of facility DNR order (yellow form or pink MOST form)     "MOST" Form in Place?        TOTAL TIME TAKING CARE OF THIS PATIENT: 35  minutes.    Loletha Grayer M.D on 09/12/2015 at 10:21 AM  Between 7am to 6pm - Pager - [phone]  After 6pm go to [email or web address] - password [email or web address]  [facility] Office  [phone]  CC: Primary care physician; No primary care provider on file.

## 2015-09-12 NOTE — Progress Notes (Signed)
Patient is medically stable for D/C to [facility] today. Per Kim admissions coordinator at [facility] patient will go to room 208-A. RN will call report at [phone] and arrange EMS for transport. [facility] authorization has been received. Auth # [id number]. Clinical Education officer, museum (CSW) sent D/C Summary, FL2 and D/C Packet to [employer] via [employer]. Patient is aware of above. Patient's wife [name] is at bedside and aware of above. Please reconsult if future social work needs arise. CSW signing off.   Blima Rich, LCSW [phone]

## 2015-09-12 NOTE — Discharge Instructions (Signed)
Hip Fracture A hip fracture is a fracture of the upper part of your thigh bone (femur).  CAUSES A hip fracture is caused by a direct blow to the side of your hip. This is usually the result of a fall but can occur in other circumstances, such as an automobile accident. RISK FACTORS There is an increased risk of hip fractures in people with:  An unsteady walking pattern (gait) and those with conditions that contribute to poor balance, such as Parkinson's disease or dementia.  Osteopenia and osteoporosis.  Cancer that spreads to the leg bones.  Certain metabolic diseases. SYMPTOMS  Symptoms of hip fracture include:  Pain over the injured hip.  Inability to put weight on the leg in which the fracture occurred (although, some patients are able to walk after a hip fracture).  Toes and foot of the affected leg point outward when you lie down. DIAGNOSIS A physical exam can determine if a hip fracture is likely to have occurred. X-ray exams are needed to confirm the fracture and to look for other injuries. The X-ray exam can help to determine the type of hip fracture. Rarely, the fracture is not visible on an X-ray image and a CT scan or MRI will have to be done. TREATMENT  The treatment for a fracture is usually surgery. This means using a screw, nail, or rod to hold the bones in place.  HOME CARE INSTRUCTIONS Take all medicines as directed by your health care provider. SEEK MEDICAL CARE IF: Pain continues, even after taking pain medicine. MAKE SURE YOU:  Understand these instructions.   Will watch your condition.  Will get help right away if you are not doing well or get worse.   This information is not intended to replace advice given to you by your health care provider. Make sure you discuss any questions you have with your health care provider.   Document Released: 04/30/2005 Document Revised: 05/05/2013 Document Reviewed: 12/10/2012 Elsevier Interactive Patient Education 2016  Elsevier Inc.  

## 2015-09-12 NOTE — Progress Notes (Signed)
DISCHARGE NOTE:  Report called to [place] at [facility]. EMS called for transportation.

## 2015-09-13 DIAGNOSIS — I10 Essential (primary) hypertension: Secondary | ICD-10-CM | POA: Diagnosis not present

## 2015-09-13 DIAGNOSIS — E119 Type 2 diabetes mellitus without complications: Secondary | ICD-10-CM | POA: Diagnosis not present

## 2015-09-13 DIAGNOSIS — M810 Age-related osteoporosis without current pathological fracture: Secondary | ICD-10-CM | POA: Diagnosis not present

## 2015-09-13 LAB — URINE CULTURE: Culture: NO GROWTH

## 2015-09-13 LAB — GLUCOSE, CAPILLARY
GLUCOSE-CAPILLARY: 205 mg/dL — AB (ref 65–99)
GLUCOSE-CAPILLARY: 215 mg/dL — AB (ref 65–99)
Glucose-Capillary: 341 mg/dL — ABNORMAL HIGH (ref 65–99)
Glucose-Capillary: 147 mg/dL — ABNORMAL HIGH (ref 65–99)

## 2015-09-14 LAB — GLUCOSE, CAPILLARY
GLUCOSE-CAPILLARY: 240 mg/dL — AB (ref 65–99)
GLUCOSE-CAPILLARY: 240 mg/dL — AB (ref 65–99)
GLUCOSE-CAPILLARY: 150 mg/dL — AB (ref 65–99)
GLUCOSE-CAPILLARY: 226 mg/dL — AB (ref 65–99)
Glucose-Capillary: 136 mg/dL — ABNORMAL HIGH (ref 65–99)
Glucose-Capillary: 213 mg/dL — ABNORMAL HIGH (ref 65–99)

## 2015-09-15 DIAGNOSIS — E119 Type 2 diabetes mellitus without complications: Secondary | ICD-10-CM | POA: Diagnosis not present

## 2015-09-15 LAB — GLUCOSE, CAPILLARY
GLUCOSE-CAPILLARY: 245 mg/dL — AB (ref 65–99)
Glucose-Capillary: 136 mg/dL — ABNORMAL HIGH (ref 65–99)
Glucose-Capillary: 201 mg/dL — ABNORMAL HIGH (ref 65–99)
Glucose-Capillary: 178 mg/dL — ABNORMAL HIGH (ref 65–99)

## 2015-09-16 DIAGNOSIS — E119 Type 2 diabetes mellitus without complications: Secondary | ICD-10-CM | POA: Diagnosis not present

## 2015-09-16 LAB — GLUCOSE, CAPILLARY
GLUCOSE-CAPILLARY: 147 mg/dL — AB (ref 65–99)
Glucose-Capillary: 164 mg/dL — ABNORMAL HIGH (ref 65–99)
Glucose-Capillary: 202 mg/dL — ABNORMAL HIGH (ref 65–99)
Glucose-Capillary: 300 mg/dL — ABNORMAL HIGH (ref 65–99)

## 2015-09-17 DIAGNOSIS — E119 Type 2 diabetes mellitus without complications: Secondary | ICD-10-CM | POA: Diagnosis not present

## 2015-09-17 LAB — GLUCOSE, CAPILLARY
GLUCOSE-CAPILLARY: 156 mg/dL — AB (ref 65–99)
GLUCOSE-CAPILLARY: 153 mg/dL — AB (ref 65–99)
GLUCOSE-CAPILLARY: 271 mg/dL — AB (ref 65–99)
GLUCOSE-CAPILLARY: 219 mg/dL — AB (ref 65–99)

## 2015-09-18 DIAGNOSIS — E119 Type 2 diabetes mellitus without complications: Secondary | ICD-10-CM | POA: Diagnosis not present

## 2015-09-18 LAB — GLUCOSE, CAPILLARY
GLUCOSE-CAPILLARY: 124 mg/dL — AB (ref 65–99)
GLUCOSE-CAPILLARY: 190 mg/dL — AB (ref 65–99)
GLUCOSE-CAPILLARY: 175 mg/dL — AB (ref 65–99)
GLUCOSE-CAPILLARY: 240 mg/dL — AB (ref 65–99)

## 2015-09-19 DIAGNOSIS — E119 Type 2 diabetes mellitus without complications: Secondary | ICD-10-CM | POA: Diagnosis not present

## 2015-09-19 LAB — GLUCOSE, CAPILLARY
GLUCOSE-CAPILLARY: 186 mg/dL — AB (ref 65–99)
GLUCOSE-CAPILLARY: 224 mg/dL — AB (ref 65–99)
GLUCOSE-CAPILLARY: 314 mg/dL — AB (ref 65–99)
Glucose-Capillary: 133 mg/dL — ABNORMAL HIGH (ref 65–99)

## 2015-09-20 DIAGNOSIS — E119 Type 2 diabetes mellitus without complications: Secondary | ICD-10-CM | POA: Diagnosis not present

## 2015-09-20 LAB — GLUCOSE, CAPILLARY
GLUCOSE-CAPILLARY: 113 mg/dL — AB (ref 65–99)
GLUCOSE-CAPILLARY: 187 mg/dL — AB (ref 65–99)
GLUCOSE-CAPILLARY: 125 mg/dL — AB (ref 65–99)
GLUCOSE-CAPILLARY: 203 mg/dL — AB (ref 65–99)

## 2015-09-21 DIAGNOSIS — E119 Type 2 diabetes mellitus without complications: Secondary | ICD-10-CM | POA: Diagnosis not present

## 2015-09-21 LAB — GLUCOSE, CAPILLARY
GLUCOSE-CAPILLARY: 162 mg/dL — AB (ref 65–99)
Glucose-Capillary: 126 mg/dL — ABNORMAL HIGH (ref 65–99)
Glucose-Capillary: 166 mg/dL — ABNORMAL HIGH (ref 65–99)
Glucose-Capillary: 184 mg/dL — ABNORMAL HIGH (ref 65–99)

## 2015-09-22 DIAGNOSIS — E119 Type 2 diabetes mellitus without complications: Secondary | ICD-10-CM | POA: Diagnosis not present

## 2015-09-22 LAB — GLUCOSE, CAPILLARY
GLUCOSE-CAPILLARY: 120 mg/dL — AB (ref 65–99)
GLUCOSE-CAPILLARY: 185 mg/dL — AB (ref 65–99)

## 2015-09-23 DIAGNOSIS — E119 Type 2 diabetes mellitus without complications: Secondary | ICD-10-CM | POA: Diagnosis not present

## 2015-09-23 LAB — GLUCOSE, CAPILLARY
GLUCOSE-CAPILLARY: 148 mg/dL — AB (ref 65–99)
GLUCOSE-CAPILLARY: 159 mg/dL — AB (ref 65–99)
GLUCOSE-CAPILLARY: 170 mg/dL — AB (ref 65–99)
Glucose-Capillary: 80 mg/dL (ref 65–99)
Glucose-Capillary: 206 mg/dL — ABNORMAL HIGH (ref 65–99)
Glucose-Capillary: 230 mg/dL — ABNORMAL HIGH (ref 65–99)

## 2015-09-24 DIAGNOSIS — E119 Type 2 diabetes mellitus without complications: Secondary | ICD-10-CM | POA: Diagnosis not present

## 2015-09-24 LAB — GLUCOSE, CAPILLARY
GLUCOSE-CAPILLARY: 160 mg/dL — AB (ref 65–99)
Glucose-Capillary: 95 mg/dL (ref 65–99)
Glucose-Capillary: 157 mg/dL — ABNORMAL HIGH (ref 65–99)
Glucose-Capillary: 226 mg/dL — ABNORMAL HIGH (ref 65–99)

## 2015-09-25 DIAGNOSIS — E119 Type 2 diabetes mellitus without complications: Secondary | ICD-10-CM | POA: Diagnosis not present

## 2015-09-25 LAB — GLUCOSE, CAPILLARY
GLUCOSE-CAPILLARY: 181 mg/dL — AB (ref 65–99)
GLUCOSE-CAPILLARY: 177 mg/dL — AB (ref 65–99)
GLUCOSE-CAPILLARY: 206 mg/dL — AB (ref 65–99)
Glucose-Capillary: 84 mg/dL (ref 65–99)

## 2015-09-26 DIAGNOSIS — E119 Type 2 diabetes mellitus without complications: Secondary | ICD-10-CM | POA: Diagnosis not present

## 2015-09-27 DIAGNOSIS — E119 Type 2 diabetes mellitus without complications: Secondary | ICD-10-CM | POA: Diagnosis not present

## 2015-09-27 LAB — GLUCOSE, CAPILLARY
GLUCOSE-CAPILLARY: 129 mg/dL — AB (ref 65–99)
GLUCOSE-CAPILLARY: 95 mg/dL (ref 65–99)
Glucose-Capillary: 147 mg/dL — ABNORMAL HIGH (ref 65–99)
Glucose-Capillary: 177 mg/dL — ABNORMAL HIGH (ref 65–99)
Glucose-Capillary: 151 mg/dL — ABNORMAL HIGH (ref 65–99)
Glucose-Capillary: 152 mg/dL — ABNORMAL HIGH (ref 65–99)
Glucose-Capillary: 202 mg/dL — ABNORMAL HIGH (ref 65–99)

## 2015-09-28 LAB — GLUCOSE, CAPILLARY
GLUCOSE-CAPILLARY: 147 mg/dL — AB (ref 65–99)
GLUCOSE-CAPILLARY: 96 mg/dL (ref 65–99)
Glucose-Capillary: 76 mg/dL (ref 65–99)
Glucose-Capillary: 155 mg/dL — ABNORMAL HIGH (ref 65–99)

## 2015-09-29 DIAGNOSIS — E119 Type 2 diabetes mellitus without complications: Secondary | ICD-10-CM | POA: Diagnosis not present

## 2015-09-29 LAB — GLUCOSE, CAPILLARY
GLUCOSE-CAPILLARY: 191 mg/dL — AB (ref 65–99)
Glucose-Capillary: 203 mg/dL — ABNORMAL HIGH (ref 65–99)
Glucose-Capillary: 71 mg/dL (ref 65–99)
Glucose-Capillary: 161 mg/dL — ABNORMAL HIGH (ref 65–99)

## 2015-09-30 DIAGNOSIS — E119 Type 2 diabetes mellitus without complications: Secondary | ICD-10-CM | POA: Diagnosis not present

## 2015-09-30 LAB — GLUCOSE, CAPILLARY
GLUCOSE-CAPILLARY: 73 mg/dL (ref 65–99)
GLUCOSE-CAPILLARY: 163 mg/dL — AB (ref 65–99)
GLUCOSE-CAPILLARY: 114 mg/dL — AB (ref 65–99)
Glucose-Capillary: 181 mg/dL — ABNORMAL HIGH (ref 65–99)

## 2015-10-01 DIAGNOSIS — E119 Type 2 diabetes mellitus without complications: Secondary | ICD-10-CM | POA: Diagnosis not present

## 2015-10-01 LAB — GLUCOSE, CAPILLARY: Glucose-Capillary: 75 mg/dL (ref 65–99)

## 2015-10-03 DIAGNOSIS — E785 Hyperlipidemia, unspecified: Secondary | ICD-10-CM | POA: Diagnosis not present

## 2015-10-03 DIAGNOSIS — S72114D Nondisplaced fracture of greater trochanter of right femur, subsequent encounter for closed fracture with routine healing: Secondary | ICD-10-CM | POA: Diagnosis not present

## 2015-10-03 DIAGNOSIS — Z8673 Personal history of transient ischemic attack (TIA), and cerebral infarction without residual deficits: Secondary | ICD-10-CM | POA: Diagnosis not present

## 2015-10-03 DIAGNOSIS — I10 Essential (primary) hypertension: Secondary | ICD-10-CM | POA: Diagnosis not present

## 2015-10-03 DIAGNOSIS — M6281 Muscle weakness (generalized): Secondary | ICD-10-CM | POA: Diagnosis not present

## 2015-10-03 DIAGNOSIS — Z9181 History of falling: Secondary | ICD-10-CM | POA: Diagnosis not present

## 2015-10-03 DIAGNOSIS — E119 Type 2 diabetes mellitus without complications: Secondary | ICD-10-CM | POA: Diagnosis not present

## 2015-10-03 DIAGNOSIS — N400 Enlarged prostate without lower urinary tract symptoms: Secondary | ICD-10-CM | POA: Diagnosis not present

## 2015-10-03 DIAGNOSIS — W1839XD Other fall on same level, subsequent encounter: Secondary | ICD-10-CM | POA: Diagnosis not present

## 2015-10-05 DIAGNOSIS — M6281 Muscle weakness (generalized): Secondary | ICD-10-CM | POA: Diagnosis not present

## 2015-10-05 DIAGNOSIS — W1839XD Other fall on same level, subsequent encounter: Secondary | ICD-10-CM | POA: Diagnosis not present

## 2015-10-05 DIAGNOSIS — Z8673 Personal history of transient ischemic attack (TIA), and cerebral infarction without residual deficits: Secondary | ICD-10-CM | POA: Diagnosis not present

## 2015-10-05 DIAGNOSIS — E785 Hyperlipidemia, unspecified: Secondary | ICD-10-CM | POA: Diagnosis not present

## 2015-10-05 DIAGNOSIS — E119 Type 2 diabetes mellitus without complications: Secondary | ICD-10-CM | POA: Diagnosis not present

## 2015-10-05 DIAGNOSIS — S72114D Nondisplaced fracture of greater trochanter of right femur, subsequent encounter for closed fracture with routine healing: Secondary | ICD-10-CM | POA: Diagnosis not present

## 2015-10-05 DIAGNOSIS — N400 Enlarged prostate without lower urinary tract symptoms: Secondary | ICD-10-CM | POA: Diagnosis not present

## 2015-10-05 DIAGNOSIS — I10 Essential (primary) hypertension: Secondary | ICD-10-CM | POA: Diagnosis not present

## 2015-10-05 DIAGNOSIS — Z9181 History of falling: Secondary | ICD-10-CM | POA: Diagnosis not present

## 2015-10-06 DIAGNOSIS — W1839XD Other fall on same level, subsequent encounter: Secondary | ICD-10-CM | POA: Diagnosis not present

## 2015-10-06 DIAGNOSIS — I10 Essential (primary) hypertension: Secondary | ICD-10-CM | POA: Diagnosis not present

## 2015-10-06 DIAGNOSIS — N400 Enlarged prostate without lower urinary tract symptoms: Secondary | ICD-10-CM | POA: Diagnosis not present

## 2015-10-06 DIAGNOSIS — M6281 Muscle weakness (generalized): Secondary | ICD-10-CM | POA: Diagnosis not present

## 2015-10-06 DIAGNOSIS — Z9181 History of falling: Secondary | ICD-10-CM | POA: Diagnosis not present

## 2015-10-06 DIAGNOSIS — E119 Type 2 diabetes mellitus without complications: Secondary | ICD-10-CM | POA: Diagnosis not present

## 2015-10-06 DIAGNOSIS — Z8673 Personal history of transient ischemic attack (TIA), and cerebral infarction without residual deficits: Secondary | ICD-10-CM | POA: Diagnosis not present

## 2015-10-06 DIAGNOSIS — S72114D Nondisplaced fracture of greater trochanter of right femur, subsequent encounter for closed fracture with routine healing: Secondary | ICD-10-CM | POA: Diagnosis not present

## 2015-10-06 DIAGNOSIS — E785 Hyperlipidemia, unspecified: Secondary | ICD-10-CM | POA: Diagnosis not present

## 2015-10-10 DIAGNOSIS — Z8673 Personal history of transient ischemic attack (TIA), and cerebral infarction without residual deficits: Secondary | ICD-10-CM | POA: Diagnosis not present

## 2015-10-10 DIAGNOSIS — S72114D Nondisplaced fracture of greater trochanter of right femur, subsequent encounter for closed fracture with routine healing: Secondary | ICD-10-CM | POA: Diagnosis not present

## 2015-10-10 DIAGNOSIS — N400 Enlarged prostate without lower urinary tract symptoms: Secondary | ICD-10-CM | POA: Diagnosis not present

## 2015-10-10 DIAGNOSIS — Z9181 History of falling: Secondary | ICD-10-CM | POA: Diagnosis not present

## 2015-10-10 DIAGNOSIS — E119 Type 2 diabetes mellitus without complications: Secondary | ICD-10-CM | POA: Diagnosis not present

## 2015-10-10 DIAGNOSIS — E785 Hyperlipidemia, unspecified: Secondary | ICD-10-CM | POA: Diagnosis not present

## 2015-10-10 DIAGNOSIS — W1839XD Other fall on same level, subsequent encounter: Secondary | ICD-10-CM | POA: Diagnosis not present

## 2015-10-10 DIAGNOSIS — M6281 Muscle weakness (generalized): Secondary | ICD-10-CM | POA: Diagnosis not present

## 2015-10-10 DIAGNOSIS — I10 Essential (primary) hypertension: Secondary | ICD-10-CM | POA: Diagnosis not present

## 2015-10-11 DIAGNOSIS — W1839XD Other fall on same level, subsequent encounter: Secondary | ICD-10-CM | POA: Diagnosis not present

## 2015-10-11 DIAGNOSIS — E119 Type 2 diabetes mellitus without complications: Secondary | ICD-10-CM | POA: Diagnosis not present

## 2015-10-11 DIAGNOSIS — M6281 Muscle weakness (generalized): Secondary | ICD-10-CM | POA: Diagnosis not present

## 2015-10-11 DIAGNOSIS — S72114D Nondisplaced fracture of greater trochanter of right femur, subsequent encounter for closed fracture with routine healing: Secondary | ICD-10-CM | POA: Diagnosis not present

## 2015-10-11 DIAGNOSIS — Z8673 Personal history of transient ischemic attack (TIA), and cerebral infarction without residual deficits: Secondary | ICD-10-CM | POA: Diagnosis not present

## 2015-10-11 DIAGNOSIS — E785 Hyperlipidemia, unspecified: Secondary | ICD-10-CM | POA: Diagnosis not present

## 2015-10-11 DIAGNOSIS — Z9181 History of falling: Secondary | ICD-10-CM | POA: Diagnosis not present

## 2015-10-11 DIAGNOSIS — N400 Enlarged prostate without lower urinary tract symptoms: Secondary | ICD-10-CM | POA: Diagnosis not present

## 2015-10-11 DIAGNOSIS — I10 Essential (primary) hypertension: Secondary | ICD-10-CM | POA: Diagnosis not present

## 2015-10-13 DIAGNOSIS — M25551 Pain in right hip: Secondary | ICD-10-CM | POA: Diagnosis not present

## 2015-10-13 DIAGNOSIS — M9701XD Periprosthetic fracture around internal prosthetic right hip joint, subsequent encounter: Secondary | ICD-10-CM | POA: Diagnosis not present

## 2015-10-13 DIAGNOSIS — Z96641 Presence of right artificial hip joint: Secondary | ICD-10-CM | POA: Diagnosis not present

## 2015-10-14 DIAGNOSIS — E119 Type 2 diabetes mellitus without complications: Secondary | ICD-10-CM | POA: Diagnosis not present

## 2015-10-14 DIAGNOSIS — M6281 Muscle weakness (generalized): Secondary | ICD-10-CM | POA: Diagnosis not present

## 2015-10-14 DIAGNOSIS — S72114D Nondisplaced fracture of greater trochanter of right femur, subsequent encounter for closed fracture with routine healing: Secondary | ICD-10-CM | POA: Diagnosis not present

## 2015-10-14 DIAGNOSIS — Z9181 History of falling: Secondary | ICD-10-CM | POA: Diagnosis not present

## 2015-10-14 DIAGNOSIS — E785 Hyperlipidemia, unspecified: Secondary | ICD-10-CM | POA: Diagnosis not present

## 2015-10-14 DIAGNOSIS — I10 Essential (primary) hypertension: Secondary | ICD-10-CM | POA: Diagnosis not present

## 2015-10-14 DIAGNOSIS — Z8673 Personal history of transient ischemic attack (TIA), and cerebral infarction without residual deficits: Secondary | ICD-10-CM | POA: Diagnosis not present

## 2015-10-14 DIAGNOSIS — N400 Enlarged prostate without lower urinary tract symptoms: Secondary | ICD-10-CM | POA: Diagnosis not present

## 2015-10-14 DIAGNOSIS — W1839XD Other fall on same level, subsequent encounter: Secondary | ICD-10-CM | POA: Diagnosis not present

## 2015-10-17 DIAGNOSIS — Z8673 Personal history of transient ischemic attack (TIA), and cerebral infarction without residual deficits: Secondary | ICD-10-CM | POA: Diagnosis not present

## 2015-10-17 DIAGNOSIS — S72114D Nondisplaced fracture of greater trochanter of right femur, subsequent encounter for closed fracture with routine healing: Secondary | ICD-10-CM | POA: Diagnosis not present

## 2015-10-17 DIAGNOSIS — W1839XD Other fall on same level, subsequent encounter: Secondary | ICD-10-CM | POA: Diagnosis not present

## 2015-10-17 DIAGNOSIS — I10 Essential (primary) hypertension: Secondary | ICD-10-CM | POA: Diagnosis not present

## 2015-10-17 DIAGNOSIS — M6281 Muscle weakness (generalized): Secondary | ICD-10-CM | POA: Diagnosis not present

## 2015-10-17 DIAGNOSIS — Z9181 History of falling: Secondary | ICD-10-CM | POA: Diagnosis not present

## 2015-10-17 DIAGNOSIS — E119 Type 2 diabetes mellitus without complications: Secondary | ICD-10-CM | POA: Diagnosis not present

## 2015-10-17 DIAGNOSIS — N400 Enlarged prostate without lower urinary tract symptoms: Secondary | ICD-10-CM | POA: Diagnosis not present

## 2015-10-17 DIAGNOSIS — E785 Hyperlipidemia, unspecified: Secondary | ICD-10-CM | POA: Diagnosis not present

## 2015-10-18 DIAGNOSIS — E119 Type 2 diabetes mellitus without complications: Secondary | ICD-10-CM | POA: Diagnosis not present

## 2015-10-18 DIAGNOSIS — M6281 Muscle weakness (generalized): Secondary | ICD-10-CM | POA: Diagnosis not present

## 2015-10-18 DIAGNOSIS — Z8673 Personal history of transient ischemic attack (TIA), and cerebral infarction without residual deficits: Secondary | ICD-10-CM | POA: Diagnosis not present

## 2015-10-18 DIAGNOSIS — N400 Enlarged prostate without lower urinary tract symptoms: Secondary | ICD-10-CM | POA: Diagnosis not present

## 2015-10-18 DIAGNOSIS — W1839XD Other fall on same level, subsequent encounter: Secondary | ICD-10-CM | POA: Diagnosis not present

## 2015-10-18 DIAGNOSIS — Z9181 History of falling: Secondary | ICD-10-CM | POA: Diagnosis not present

## 2015-10-18 DIAGNOSIS — S72114D Nondisplaced fracture of greater trochanter of right femur, subsequent encounter for closed fracture with routine healing: Secondary | ICD-10-CM | POA: Diagnosis not present

## 2015-10-18 DIAGNOSIS — I10 Essential (primary) hypertension: Secondary | ICD-10-CM | POA: Diagnosis not present

## 2015-10-18 DIAGNOSIS — E785 Hyperlipidemia, unspecified: Secondary | ICD-10-CM | POA: Diagnosis not present

## 2015-10-21 DIAGNOSIS — S72114D Nondisplaced fracture of greater trochanter of right femur, subsequent encounter for closed fracture with routine healing: Secondary | ICD-10-CM | POA: Diagnosis not present

## 2015-10-21 DIAGNOSIS — W1839XD Other fall on same level, subsequent encounter: Secondary | ICD-10-CM | POA: Diagnosis not present

## 2015-10-21 DIAGNOSIS — N400 Enlarged prostate without lower urinary tract symptoms: Secondary | ICD-10-CM | POA: Diagnosis not present

## 2015-10-21 DIAGNOSIS — E119 Type 2 diabetes mellitus without complications: Secondary | ICD-10-CM | POA: Diagnosis not present

## 2015-10-21 DIAGNOSIS — I10 Essential (primary) hypertension: Secondary | ICD-10-CM | POA: Diagnosis not present

## 2015-10-21 DIAGNOSIS — E785 Hyperlipidemia, unspecified: Secondary | ICD-10-CM | POA: Diagnosis not present

## 2015-10-21 DIAGNOSIS — Z9181 History of falling: Secondary | ICD-10-CM | POA: Diagnosis not present

## 2015-10-21 DIAGNOSIS — Z8673 Personal history of transient ischemic attack (TIA), and cerebral infarction without residual deficits: Secondary | ICD-10-CM | POA: Diagnosis not present

## 2015-10-21 DIAGNOSIS — M6281 Muscle weakness (generalized): Secondary | ICD-10-CM | POA: Diagnosis not present

## 2015-10-25 DIAGNOSIS — I10 Essential (primary) hypertension: Secondary | ICD-10-CM | POA: Diagnosis not present

## 2015-10-25 DIAGNOSIS — S72114D Nondisplaced fracture of greater trochanter of right femur, subsequent encounter for closed fracture with routine healing: Secondary | ICD-10-CM | POA: Diagnosis not present

## 2015-10-25 DIAGNOSIS — W1839XD Other fall on same level, subsequent encounter: Secondary | ICD-10-CM | POA: Diagnosis not present

## 2015-10-25 DIAGNOSIS — Z9181 History of falling: Secondary | ICD-10-CM | POA: Diagnosis not present

## 2015-10-25 DIAGNOSIS — M6281 Muscle weakness (generalized): Secondary | ICD-10-CM | POA: Diagnosis not present

## 2015-10-25 DIAGNOSIS — E119 Type 2 diabetes mellitus without complications: Secondary | ICD-10-CM | POA: Diagnosis not present

## 2015-10-25 DIAGNOSIS — N400 Enlarged prostate without lower urinary tract symptoms: Secondary | ICD-10-CM | POA: Diagnosis not present

## 2015-10-25 DIAGNOSIS — E785 Hyperlipidemia, unspecified: Secondary | ICD-10-CM | POA: Diagnosis not present

## 2015-10-25 DIAGNOSIS — Z8673 Personal history of transient ischemic attack (TIA), and cerebral infarction without residual deficits: Secondary | ICD-10-CM | POA: Diagnosis not present

## 2015-10-27 DIAGNOSIS — M6281 Muscle weakness (generalized): Secondary | ICD-10-CM | POA: Diagnosis not present

## 2015-10-27 DIAGNOSIS — W1839XD Other fall on same level, subsequent encounter: Secondary | ICD-10-CM | POA: Diagnosis not present

## 2015-10-27 DIAGNOSIS — I10 Essential (primary) hypertension: Secondary | ICD-10-CM | POA: Diagnosis not present

## 2015-10-27 DIAGNOSIS — N400 Enlarged prostate without lower urinary tract symptoms: Secondary | ICD-10-CM | POA: Diagnosis not present

## 2015-10-27 DIAGNOSIS — Z8673 Personal history of transient ischemic attack (TIA), and cerebral infarction without residual deficits: Secondary | ICD-10-CM | POA: Diagnosis not present

## 2015-10-27 DIAGNOSIS — E119 Type 2 diabetes mellitus without complications: Secondary | ICD-10-CM | POA: Diagnosis not present

## 2015-10-27 DIAGNOSIS — S72114D Nondisplaced fracture of greater trochanter of right femur, subsequent encounter for closed fracture with routine healing: Secondary | ICD-10-CM | POA: Diagnosis not present

## 2015-10-27 DIAGNOSIS — E785 Hyperlipidemia, unspecified: Secondary | ICD-10-CM | POA: Diagnosis not present

## 2015-10-27 DIAGNOSIS — Z9181 History of falling: Secondary | ICD-10-CM | POA: Diagnosis not present

## 2015-11-03 DIAGNOSIS — Z471 Aftercare following joint replacement surgery: Secondary | ICD-10-CM | POA: Diagnosis not present

## 2015-11-22 DIAGNOSIS — Z794 Long term (current) use of insulin: Secondary | ICD-10-CM | POA: Diagnosis not present

## 2015-11-22 DIAGNOSIS — E119 Type 2 diabetes mellitus without complications: Secondary | ICD-10-CM | POA: Diagnosis not present

## 2015-11-29 DIAGNOSIS — Z8781 Personal history of (healed) traumatic fracture: Secondary | ICD-10-CM | POA: Diagnosis not present

## 2015-11-29 DIAGNOSIS — E119 Type 2 diabetes mellitus without complications: Secondary | ICD-10-CM | POA: Diagnosis not present

## 2015-11-29 DIAGNOSIS — M8580 Other specified disorders of bone density and structure, unspecified site: Secondary | ICD-10-CM | POA: Diagnosis not present

## 2015-11-29 DIAGNOSIS — Z794 Long term (current) use of insulin: Secondary | ICD-10-CM | POA: Diagnosis not present

## 2015-12-13 DIAGNOSIS — E119 Type 2 diabetes mellitus without complications: Secondary | ICD-10-CM | POA: Diagnosis not present

## 2015-12-13 DIAGNOSIS — Z79899 Other long term (current) drug therapy: Secondary | ICD-10-CM | POA: Diagnosis not present

## 2015-12-20 DIAGNOSIS — N401 Enlarged prostate with lower urinary tract symptoms: Secondary | ICD-10-CM | POA: Diagnosis not present

## 2015-12-20 DIAGNOSIS — Z79899 Other long term (current) drug therapy: Secondary | ICD-10-CM | POA: Diagnosis not present

## 2015-12-20 DIAGNOSIS — E7800 Pure hypercholesterolemia, unspecified: Secondary | ICD-10-CM | POA: Diagnosis not present

## 2015-12-20 DIAGNOSIS — E119 Type 2 diabetes mellitus without complications: Secondary | ICD-10-CM | POA: Diagnosis not present

## 2015-12-20 DIAGNOSIS — I10 Essential (primary) hypertension: Secondary | ICD-10-CM | POA: Diagnosis not present

## 2015-12-20 DIAGNOSIS — R3915 Urgency of urination: Secondary | ICD-10-CM | POA: Diagnosis not present

## 2015-12-20 DIAGNOSIS — M810 Age-related osteoporosis without current pathological fracture: Secondary | ICD-10-CM | POA: Diagnosis not present

## 2016-01-05 DIAGNOSIS — H43813 Vitreous degeneration, bilateral: Secondary | ICD-10-CM | POA: Diagnosis not present

## 2016-01-05 DIAGNOSIS — H40003 Preglaucoma, unspecified, bilateral: Secondary | ICD-10-CM | POA: Diagnosis not present

## 2016-04-17 DIAGNOSIS — R062 Wheezing: Secondary | ICD-10-CM | POA: Diagnosis not present

## 2016-04-17 DIAGNOSIS — E119 Type 2 diabetes mellitus without complications: Secondary | ICD-10-CM | POA: Diagnosis not present

## 2016-04-17 DIAGNOSIS — R05 Cough: Secondary | ICD-10-CM | POA: Diagnosis not present

## 2016-04-19 DIAGNOSIS — E119 Type 2 diabetes mellitus without complications: Secondary | ICD-10-CM | POA: Diagnosis not present

## 2016-04-19 DIAGNOSIS — M810 Age-related osteoporosis without current pathological fracture: Secondary | ICD-10-CM | POA: Diagnosis not present

## 2016-04-19 DIAGNOSIS — Z79899 Other long term (current) drug therapy: Secondary | ICD-10-CM | POA: Diagnosis not present

## 2016-04-19 DIAGNOSIS — E7800 Pure hypercholesterolemia, unspecified: Secondary | ICD-10-CM | POA: Diagnosis not present

## 2016-04-26 DIAGNOSIS — Z0000 Encounter for general adult medical examination without abnormal findings: Secondary | ICD-10-CM | POA: Diagnosis not present

## 2016-05-17 ENCOUNTER — Emergency Department: Payer: Medicare HMO

## 2016-05-17 ENCOUNTER — Inpatient Hospital Stay
Admission: EM | Admit: 2016-05-17 | Discharge: 2016-05-19 | DRG: 871 | Disposition: A | Discharge status: Home Health | Payer: Medicare HMO | Attending: Internal Medicine | Admitting: Internal Medicine

## 2016-05-17 ENCOUNTER — Encounter: Payer: Self-pay | Admitting: *Deleted

## 2016-05-17 DIAGNOSIS — D649 Anemia, unspecified: Secondary | ICD-10-CM | POA: Diagnosis not present

## 2016-05-17 DIAGNOSIS — I10 Essential (primary) hypertension: Secondary | ICD-10-CM | POA: Diagnosis present

## 2016-05-17 DIAGNOSIS — Z96652 Presence of left artificial knee joint: Secondary | ICD-10-CM | POA: Diagnosis present

## 2016-05-17 DIAGNOSIS — A419 Sepsis, unspecified organism: Secondary | ICD-10-CM | POA: Diagnosis present

## 2016-05-17 DIAGNOSIS — I2510 Atherosclerotic heart disease of native coronary artery without angina pectoris: Secondary | ICD-10-CM | POA: Diagnosis present

## 2016-05-17 DIAGNOSIS — S0990XA Unspecified injury of head, initial encounter: Secondary | ICD-10-CM

## 2016-05-17 DIAGNOSIS — W1839XA Other fall on same level, initial encounter: Secondary | ICD-10-CM | POA: Diagnosis present

## 2016-05-17 DIAGNOSIS — E119 Type 2 diabetes mellitus without complications: Secondary | ICD-10-CM | POA: Diagnosis not present

## 2016-05-17 DIAGNOSIS — E785 Hyperlipidemia, unspecified: Secondary | ICD-10-CM | POA: Diagnosis not present

## 2016-05-17 DIAGNOSIS — S0081XA Abrasion of other part of head, initial encounter: Secondary | ICD-10-CM | POA: Diagnosis present

## 2016-05-17 DIAGNOSIS — M810 Age-related osteoporosis without current pathological fracture: Secondary | ICD-10-CM | POA: Diagnosis present

## 2016-05-17 DIAGNOSIS — R4182 Altered mental status, unspecified: Secondary | ICD-10-CM | POA: Diagnosis present

## 2016-05-17 DIAGNOSIS — E871 Hypo-osmolality and hyponatremia: Secondary | ICD-10-CM | POA: Diagnosis not present

## 2016-05-17 DIAGNOSIS — Z823 Family history of stroke: Secondary | ICD-10-CM | POA: Diagnosis not present

## 2016-05-17 DIAGNOSIS — Z9181 History of falling: Secondary | ICD-10-CM

## 2016-05-17 DIAGNOSIS — I712 Thoracic aortic aneurysm, without rupture: Secondary | ICD-10-CM | POA: Diagnosis not present

## 2016-05-17 DIAGNOSIS — E1165 Type 2 diabetes mellitus with hyperglycemia: Secondary | ICD-10-CM | POA: Diagnosis not present

## 2016-05-17 DIAGNOSIS — E039 Hypothyroidism, unspecified: Secondary | ICD-10-CM | POA: Diagnosis present

## 2016-05-17 DIAGNOSIS — R410 Disorientation, unspecified: Secondary | ICD-10-CM | POA: Diagnosis not present

## 2016-05-17 DIAGNOSIS — S299XXA Unspecified injury of thorax, initial encounter: Secondary | ICD-10-CM | POA: Diagnosis not present

## 2016-05-17 DIAGNOSIS — Z96643 Presence of artificial hip joint, bilateral: Secondary | ICD-10-CM | POA: Diagnosis present

## 2016-05-17 DIAGNOSIS — R05 Cough: Secondary | ICD-10-CM | POA: Diagnosis not present

## 2016-05-17 DIAGNOSIS — Z8673 Personal history of transient ischemic attack (TIA), and cerebral infarction without residual deficits: Secondary | ICD-10-CM | POA: Diagnosis not present

## 2016-05-17 DIAGNOSIS — N400 Enlarged prostate without lower urinary tract symptoms: Secondary | ICD-10-CM | POA: Diagnosis present

## 2016-05-17 DIAGNOSIS — R2681 Unsteadiness on feet: Secondary | ICD-10-CM

## 2016-05-17 DIAGNOSIS — J189 Pneumonia, unspecified organism: Secondary | ICD-10-CM | POA: Diagnosis present

## 2016-05-17 DIAGNOSIS — J181 Lobar pneumonia, unspecified organism: Secondary | ICD-10-CM | POA: Diagnosis not present

## 2016-05-17 DIAGNOSIS — Y92232 Corridor of hospital as the place of occurrence of the external cause: Secondary | ICD-10-CM | POA: Diagnosis present

## 2016-05-17 DIAGNOSIS — I7121 Aneurysm of the ascending aorta, without rupture: Secondary | ICD-10-CM

## 2016-05-17 DIAGNOSIS — Z7902 Long term (current) use of antithrombotics/antiplatelets: Secondary | ICD-10-CM

## 2016-05-17 DIAGNOSIS — M6281 Muscle weakness (generalized): Secondary | ICD-10-CM

## 2016-05-17 DIAGNOSIS — Z7982 Long term (current) use of aspirin: Secondary | ICD-10-CM

## 2016-05-17 DIAGNOSIS — Z79899 Other long term (current) drug therapy: Secondary | ICD-10-CM

## 2016-05-17 DIAGNOSIS — R531 Weakness: Secondary | ICD-10-CM

## 2016-05-17 HISTORY — DX: Age-related osteoporosis without current pathological fracture: M81.0

## 2016-05-17 HISTORY — DX: Hyperlipidemia, unspecified: E78.5

## 2016-05-17 HISTORY — DX: Type 2 diabetes mellitus without complications: E11.9

## 2016-05-17 HISTORY — DX: Benign prostatic hyperplasia without lower urinary tract symptoms: N40.0

## 2016-05-17 HISTORY — DX: Essential (primary) hypertension: I10

## 2016-05-17 HISTORY — DX: Hypothyroidism, unspecified: E03.9

## 2016-05-17 HISTORY — DX: Transient cerebral ischemic attack, unspecified: G45.9

## 2016-05-17 LAB — RAPID INFLUENZA A&B ANTIGENS
Influenza A (ARMC): NEGATIVE
Influenza B (ARMC): NEGATIVE

## 2016-05-17 LAB — CBC
HCT: 35 % — ABNORMAL LOW (ref 40.0–52.0)
Hemoglobin: 12.2 g/dL — ABNORMAL LOW (ref 13.0–18.0)
MCH: 32.4 pg (ref 26.0–34.0)
MCHC: 34.9 g/dL (ref 32.0–36.0)
MCV: 93 fL (ref 80.0–100.0)
PLATELETS: 178 10*3/uL (ref 150–440)
RBC: 3.76 MIL/uL — ABNORMAL LOW (ref 4.40–5.90)
RDW: 12.8 % (ref 11.5–14.5)
WBC: 8.6 10*3/uL (ref 3.8–10.6)

## 2016-05-17 LAB — PROTIME-INR
INR: 1.04
PROTHROMBIN TIME: 13.6 s (ref 11.4–15.2)

## 2016-05-17 LAB — COMPREHENSIVE METABOLIC PANEL
ALT: 15 U/L — ABNORMAL LOW (ref 17–63)
AST: 25 U/L (ref 15–41)
Albumin: 4.1 g/dL (ref 3.5–5.0)
Alkaline Phosphatase: 74 U/L (ref 38–126)
Anion gap: 10 (ref 5–15)
BILIRUBIN TOTAL: 0.8 mg/dL (ref 0.3–1.2)
BUN: 15 mg/dL (ref 6–20)
CO2: 25 mmol/L (ref 22–32)
CREATININE: 0.94 mg/dL (ref 0.61–1.24)
Calcium: 8.9 mg/dL (ref 8.9–10.3)
Chloride: 92 mmol/L — ABNORMAL LOW (ref 101–111)
GFR calc Af Amer: >60 mL/min (ref >60)
Glucose, Bld: 335 mg/dL — ABNORMAL HIGH (ref 65–99)
POTASSIUM: 4.2 mmol/L (ref 3.5–5.1)
Sodium: 127 mmol/L — ABNORMAL LOW (ref 135–145)
TOTAL PROTEIN: 7.2 g/dL (ref 6.5–8.1)

## 2016-05-17 LAB — LACTIC ACID, PLASMA: LACTIC ACID, VENOUS: 1.4 mmol/L (ref 0.5–1.9)

## 2016-05-17 LAB — TROPONIN I

## 2016-05-17 MED ORDER — SODIUM CHLORIDE 0.9 % IV SOLN: 1000.0000 mL | Freq: Once | INTRAVENOUS | Status: DC

## 2016-05-17 MED ORDER — SODIUM CHLORIDE 0.9 % IV SOLN
1000.0000 mL | Freq: Once | INTRAVENOUS | Status: AC
  Administered 2016-05-18: 1000 mL via INTRAVENOUS

## 2016-05-17 NOTE — ED Provider Notes (Signed)
[facility] Emergency Department Provider Note   ____________________________________________    I have reviewed the triage vital signs and the nursing notes.   HISTORY  Chief Complaint Altered Mental Status and Fall     HPI Brian Barry is a 83 y.o. male Who presents with complaints of cough and some confusion. Wife reports that she has noticed a cough over the last 1-2 days. She is more upset about confusion which has been gradually worsening over the last several months. Patient reports he overall feels well and has no complaints. He does report he lost his balance in the parking lot and fell forward and struck his head. He is on Plavix   No past medical history on file.  Patient Active Problem List   Diagnosis Date Noted  . Hip fracture ([place]) 09/10/2015    No past surgical history on file.  Prior to Admission medications   Medication Sig Start Date End Date Taking? Authorizing Provider  aspirin EC 81 MG tablet Take 81 mg by mouth daily.    Historical Provider, MD  clopidogrel (PLAVIX) 75 MG tablet Take 75 mg by mouth daily. 07/11/15   Historical Provider, MD  enoxaparin (LOVENOX) 40 MG/0.4ML injection Inject 0.4 mLs (40 mg total) into the skin daily. 09/12/15   Loletha Grayer, MD  finasteride (PROSCAR) 5 MG tablet Take 5 mg by mouth daily. 07/11/15   Historical Provider, MD  LANTUS SOLOSTAR 100 UNIT/ML Solostar Pen Inject 18 Units into the skin every morning. 06/13/15   Historical Provider, MD  lisinopril (PRINIVIL,ZESTRIL) 20 MG tablet Take 20 mg by mouth daily. 07/11/15   Historical Provider, MD  metFORMIN (GLUCOPHAGE) 500 MG tablet Take 1,000 mg by mouth 2 (two) times daily. 08/12/15   Historical Provider, MD  oxyCODONE (OXY IR/ROXICODONE) 5 MG immediate release tablet Take 1 tablet (5 mg total) by mouth every 4 (four) hours as needed for moderate pain. 09/12/15   Loletha Grayer, MD  polyethylene glycol [facility] / GLYCOLAX) packet Take 17 g by  mouth daily. 09/12/15   Loletha Grayer, MD  senna-docusate (SENOKOT-S) 8.6-50 MG tablet Take 1 tablet by mouth at bedtime as needed for mild constipation. 09/12/15   Loletha Grayer, MD  simvastatin (ZOCOR) 20 MG tablet Take 20 mg by mouth at bedtime.  07/11/15   Historical Provider, MD  tamsulosin (FLOMAX) 0.4 MG CAPS capsule Take 0.4 mg by mouth 2 (two) times daily. 08/08/15   Historical Provider, MD     Allergies Patient has no known allergies.  No family history on file.  Social History Social History  Substance Use Topics  . Smoking status: Never Smoker  . Smokeless tobacco: Never Used  . Alcohol use No    Review of Systems  Constitutional: No fever/chills Eyes: No visual changes.  ENT: No sore throat. Cardiovascular: Denies chest pain. Respiratory: Denies shortness of breath. Gastrointestinal: No abdominal pain.  No nausea, no vomiting.   Genitourinary: Negative for dysuria. Musculoskeletal: Negative for back pain. Skin: Negative for rash. Neurological: Negative for headaches or weakness  10-point ROS otherwise negative.  ____________________________________________   PHYSICAL EXAM:  VITAL SIGNS: ED Triage Vitals  Enc Vitals Group     BP 05/17/16 2103 (!) 159/78     Pulse Rate 05/17/16 2103 100     Resp 05/17/16 2103 20     Temp 05/17/16 2103 (!) 100.8 F (38.2 C)     Temp Source 05/17/16 2103 Oral     SpO2 05/17/16 2103 95 %  Weight 05/17/16 2104 154 lb (69.9 kg)     Height 05/17/16 2104 5\' 10"  (1.778 m)     Head Circumference --      Peak Flow --      Pain Score 05/17/16 2104 0     Pain Loc --      Pain Edu? --      Excl. in [place]? --     Constitutional: Alert and oriented. No acute distress. Pleasant and interactive Eyes: Conjunctivae are normal.  Head: abrasion left forehead Nose: No congestion/rhinnorhea. Mouth/Throat: Mucous membranes are moist.   Neck:  Painless ROM, no vertebral tenderness to palpation Cardiovascular: Normal rate, regular  rhythm. Grossly normal heart sounds.  Good peripheral circulation. Respiratory: Normal respiratory effort.  No retractions. Lungs CTAB. Gastrointestinal: Soft and nontender. No distention.  No CVA tenderness. Genitourinary: deferred Musculoskeletal: Warm and well perfused Neurologic:  Normal speech and language. No gross focal neurologic deficits are appreciated.  Skin:  Skin is warm, dry. Psychiatric: Mood and affect are normal. Speech and behavior are normal.  ____________________________________________   LABS (all labs ordered are listed, but only abnormal results are displayed)  Labs Reviewed  COMPREHENSIVE METABOLIC PANEL - Abnormal; Notable for the following:       Result Value   Sodium 127 (*)    Chloride 92 (*)    Glucose, Bld 335 (*)    ALT 15 (*)    All other components within normal limits  CBC - Abnormal; Notable for the following:    RBC 3.76 (*)    Hemoglobin 12.2 (*)    HCT 35.0 (*)    All other components within normal limits  RAPID INFLUENZA A&B ANTIGENS ([facility] ONLY)  CULTURE, BLOOD (ROUTINE X 2)  CULTURE, BLOOD (ROUTINE X 2)  PROTIME-INR  TROPONIN I  LACTIC ACID, PLASMA  LACTIC ACID, PLASMA  URINALYSIS, COMPLETE (UACMP) WITH MICROSCOPIC   ____________________________________________  EKG  ED ECG REPORT I, Lavonia Drafts, the attending physician, personally viewed and interpreted this ECG.  Date: 05/17/2016 EKG Time: 9:19 PM Rate: 85 Rhythm: normal sinus rhythm QRS Axis: normal Intervals: normal ST/T Wave abnormalities: normal Conduction Disturbances: none Narrative Interpretation: unremarkable  ____________________________________________  RADIOLOGY  Chest x-ray unremarkable, CT head does not demonstrate any acute intracranial process CT chest pending ____________________________________________   PROCEDURES  Procedure(s) performed: No    Critical Care performed: No ____________________________________________   INITIAL  IMPRESSION / ASSESSMENT AND PLAN / ED COURSE  Pertinent labs & imaging results that were available during my care of the patient were reviewed by me and considered in my medical decision making (see chart for details).  Patient overall well-appearing and in no acute distress. He does have a cough and a fever. Reported worsening of confusion although this primarily seems gradual in nature. Differential includes pneumonia versus viral upper respiratory infection versus UTI versus worsening dementia.  Clinical Course   ----------------------------------------- 11:48 PM on 05/17/2016 -----------------------------------------  chest x-ray is unremarkable. Discussed with family we will obtain CT noncontrast to rule out pneumonia as best as we can, given IV fluids and reevaluate.  Overall patient is quite well-appearing and anticipate discharge ____________________________________________   FINAL CLINICAL IMPRESSION(S) / ED DIAGNOSES  Final diagnoses:  Confusion  Injury of head, initial encounter      NEW MEDICATIONS STARTED DURING THIS VISIT:  New Prescriptions   No medications on file     Note:  This document was prepared using Dragon voice recognition software and may include unintentional dictation errors.  Lavonia Drafts, MD 05/17/16 [phone]

## 2016-05-17 NOTE — ED Triage Notes (Signed)
Pt to triage via wheelchair.  Pt reports a cough since yesterday and fever.  Family reports pt with altered mental changes today with confusion and lethargy.  Pt fell getting out of car tonight in the ER striking his head on concrete..pt on plavix.  No loc.  No vomiting.  Pt alert. Abrasion and hematoma to right forehead.  Pt denies h/a.

## 2016-05-18 ENCOUNTER — Encounter: Payer: Self-pay | Admitting: Family Medicine

## 2016-05-18 DIAGNOSIS — J189 Pneumonia, unspecified organism: Secondary | ICD-10-CM | POA: Diagnosis present

## 2016-05-18 DIAGNOSIS — I712 Thoracic aortic aneurysm, without rupture: Secondary | ICD-10-CM | POA: Diagnosis present

## 2016-05-18 DIAGNOSIS — I2510 Atherosclerotic heart disease of native coronary artery without angina pectoris: Secondary | ICD-10-CM | POA: Diagnosis present

## 2016-05-18 DIAGNOSIS — D649 Anemia, unspecified: Secondary | ICD-10-CM | POA: Diagnosis present

## 2016-05-18 DIAGNOSIS — Z9181 History of falling: Secondary | ICD-10-CM | POA: Diagnosis not present

## 2016-05-18 DIAGNOSIS — N400 Enlarged prostate without lower urinary tract symptoms: Secondary | ICD-10-CM | POA: Diagnosis present

## 2016-05-18 DIAGNOSIS — Z823 Family history of stroke: Secondary | ICD-10-CM | POA: Diagnosis not present

## 2016-05-18 DIAGNOSIS — Z79899 Other long term (current) drug therapy: Secondary | ICD-10-CM | POA: Diagnosis not present

## 2016-05-18 DIAGNOSIS — W1839XA Other fall on same level, initial encounter: Secondary | ICD-10-CM | POA: Diagnosis present

## 2016-05-18 DIAGNOSIS — M810 Age-related osteoporosis without current pathological fracture: Secondary | ICD-10-CM | POA: Diagnosis present

## 2016-05-18 DIAGNOSIS — Z7902 Long term (current) use of antithrombotics/antiplatelets: Secondary | ICD-10-CM | POA: Diagnosis not present

## 2016-05-18 DIAGNOSIS — S0081XA Abrasion of other part of head, initial encounter: Secondary | ICD-10-CM | POA: Diagnosis present

## 2016-05-18 DIAGNOSIS — I10 Essential (primary) hypertension: Secondary | ICD-10-CM | POA: Diagnosis present

## 2016-05-18 DIAGNOSIS — R410 Disorientation, unspecified: Secondary | ICD-10-CM | POA: Diagnosis present

## 2016-05-18 DIAGNOSIS — E871 Hypo-osmolality and hyponatremia: Secondary | ICD-10-CM | POA: Diagnosis present

## 2016-05-18 DIAGNOSIS — A419 Sepsis, unspecified organism: Secondary | ICD-10-CM | POA: Diagnosis present

## 2016-05-18 DIAGNOSIS — Y92232 Corridor of hospital as the place of occurrence of the external cause: Secondary | ICD-10-CM | POA: Diagnosis present

## 2016-05-18 DIAGNOSIS — E785 Hyperlipidemia, unspecified: Secondary | ICD-10-CM | POA: Diagnosis present

## 2016-05-18 DIAGNOSIS — R4182 Altered mental status, unspecified: Secondary | ICD-10-CM | POA: Diagnosis present

## 2016-05-18 DIAGNOSIS — Z8673 Personal history of transient ischemic attack (TIA), and cerebral infarction without residual deficits: Secondary | ICD-10-CM | POA: Diagnosis not present

## 2016-05-18 DIAGNOSIS — E1165 Type 2 diabetes mellitus with hyperglycemia: Secondary | ICD-10-CM | POA: Diagnosis present

## 2016-05-18 DIAGNOSIS — Z7982 Long term (current) use of aspirin: Secondary | ICD-10-CM | POA: Diagnosis not present

## 2016-05-18 DIAGNOSIS — Z96652 Presence of left artificial knee joint: Secondary | ICD-10-CM | POA: Diagnosis present

## 2016-05-18 DIAGNOSIS — Z96643 Presence of artificial hip joint, bilateral: Secondary | ICD-10-CM | POA: Diagnosis present

## 2016-05-18 DIAGNOSIS — E039 Hypothyroidism, unspecified: Secondary | ICD-10-CM | POA: Diagnosis present

## 2016-05-18 LAB — URINALYSIS, COMPLETE (UACMP) WITH MICROSCOPIC
Bacteria, UA: NONE SEEN
Bilirubin Urine: NEGATIVE
Glucose, UA: >=500 mg/dL — AB
Hgb urine dipstick: NEGATIVE
KETONES UR: NEGATIVE mg/dL
Leukocytes, UA: NEGATIVE
Nitrite: NEGATIVE
PH: 5 (ref 5.0–8.0)
Protein, ur: NEGATIVE mg/dL
SPECIFIC GRAVITY, URINE: 1.018 (ref 1.005–1.030)
SQUAMOUS EPITHELIAL / LPF: NONE SEEN
WBC, UA: NONE SEEN WBC/hpf (ref 0–5)

## 2016-05-18 LAB — CBC
HEMATOCRIT: 30.2 % — AB (ref 40.0–52.0)
Hemoglobin: 10.5 g/dL — ABNORMAL LOW (ref 13.0–18.0)
MCH: 32.3 pg (ref 26.0–34.0)
MCHC: 34.9 g/dL (ref 32.0–36.0)
MCV: 92.6 fL (ref 80.0–100.0)
PLATELETS: 152 10*3/uL (ref 150–440)
RBC: 3.26 MIL/uL — ABNORMAL LOW (ref 4.40–5.90)
RDW: 12.8 % (ref 11.5–14.5)
WBC: 6.5 10*3/uL (ref 3.8–10.6)

## 2016-05-18 LAB — BASIC METABOLIC PANEL
ANION GAP: 7 (ref 5–15)
BUN: 10 mg/dL (ref 6–20)
CALCIUM: 8.2 mg/dL — AB (ref 8.9–10.3)
CO2: 26 mmol/L (ref 22–32)
CREATININE: 0.73 mg/dL (ref 0.61–1.24)
Chloride: 96 mmol/L — ABNORMAL LOW (ref 101–111)
GFR calc Af Amer: >60 mL/min (ref >60)
GLUCOSE: 242 mg/dL — AB (ref 65–99)
Potassium: 3.4 mmol/L — ABNORMAL LOW (ref 3.5–5.1)
Sodium: 129 mmol/L — ABNORMAL LOW (ref 135–145)

## 2016-05-18 LAB — GLUCOSE, CAPILLARY
GLUCOSE-CAPILLARY: 250 mg/dL — AB (ref 65–99)
GLUCOSE-CAPILLARY: 249 mg/dL — AB (ref 65–99)
Glucose-Capillary: 226 mg/dL — ABNORMAL HIGH (ref 65–99)
Glucose-Capillary: 262 mg/dL — ABNORMAL HIGH (ref 65–99)

## 2016-05-18 MED ORDER — ACETAMINOPHEN 650 MG RE SUPP: 650.0000 mg | Freq: Four times a day (QID) | RECTAL | Status: DC | PRN

## 2016-05-18 MED ORDER — CEFTRIAXONE SODIUM-DEXTROSE 1-3.74 GM-% IV SOLR
1.0000 g | INTRAVENOUS | Status: DC
  Administered 2016-05-18: 1 g via INTRAVENOUS
  Filled 2016-05-18: qty 50

## 2016-05-18 MED ORDER — INSULIN ASPART 100 UNIT/ML ~~LOC~~ SOLN
0.0000 [IU] | Freq: Three times a day (TID) | SUBCUTANEOUS | Status: DC
Start: 2016-05-18 — End: 2016-05-19
  Administered 2016-05-18: 5 [IU] via SUBCUTANEOUS
  Administered 2016-05-18: 8 [IU] via SUBCUTANEOUS
  Administered 2016-05-18: 5 [IU] via SUBCUTANEOUS
  Administered 2016-05-19: 3 [IU] via SUBCUTANEOUS
  Filled 2016-05-18: qty 8
  Filled 2016-05-18: qty 5
  Filled 2016-05-18: qty 3
  Filled 2016-05-18: qty 5

## 2016-05-18 MED ORDER — GLUCERNA SHAKE PO LIQD
237.0000 mL | Freq: Two times a day (BID) | ORAL | Status: DC
  Administered 2016-05-18 (×2): 237 mL via ORAL

## 2016-05-18 MED ORDER — DM-GUAIFENESIN ER 30-600 MG PO TB12
1.0000 | ORAL_TABLET | Freq: Two times a day (BID) | ORAL | Status: DC
Start: 2016-05-18 — End: 2016-05-18

## 2016-05-18 MED ORDER — IPRATROPIUM-ALBUTEROL 0.5-2.5 (3) MG/3ML IN SOLN: 3.0000 mL | Freq: Four times a day (QID) | RESPIRATORY_TRACT | Status: DC | PRN

## 2016-05-18 MED ORDER — DEXTROMETHORPHAN POLISTIREX ER 30 MG/5ML PO SUER
30.0000 mg | Freq: Two times a day (BID) | ORAL | Status: DC
  Administered 2016-05-18 – 2016-05-19 (×3): 30 mg via ORAL
  Filled 2016-05-18 (×3): qty 5

## 2016-05-18 MED ORDER — POLYETHYLENE GLYCOL 3350 17 G PO PACK
17.0000 g | PACK | Freq: Every day | ORAL | Status: DC
  Administered 2016-05-18 – 2016-05-19 (×2): 17 g via ORAL
  Filled 2016-05-18 (×2): qty 1

## 2016-05-18 MED ORDER — SODIUM CHLORIDE 0.9% FLUSH
3.0000 mL | Freq: Two times a day (BID) | INTRAVENOUS | Status: DC
  Administered 2016-05-18 – 2016-05-19 (×3): 3 mL via INTRAVENOUS

## 2016-05-18 MED ORDER — SIMVASTATIN 20 MG PO TABS
20.0000 mg | ORAL_TABLET | Freq: Every day | ORAL | Status: DC
  Administered 2016-05-18: 20 mg via ORAL
  Filled 2016-05-18: qty 1

## 2016-05-18 MED ORDER — DEXTROSE 5 % IV SOLN: 1.0000 g | Freq: Once | INTRAVENOUS | Status: DC

## 2016-05-18 MED ORDER — LISINOPRIL 20 MG PO TABS
20.0000 mg | ORAL_TABLET | Freq: Every day | ORAL | Status: DC
Start: 2016-05-18 — End: 2016-05-19
  Administered 2016-05-18 – 2016-05-19 (×2): 20 mg via ORAL
  Filled 2016-05-18 (×2): qty 1

## 2016-05-18 MED ORDER — POTASSIUM CHLORIDE CRYS ER 20 MEQ PO TBCR
40.0000 meq | EXTENDED_RELEASE_TABLET | ORAL | Status: AC
  Administered 2016-05-18 (×2): 40 meq via ORAL
  Filled 2016-05-18 (×2): qty 2

## 2016-05-18 MED ORDER — INSULIN ASPART 100 UNIT/ML ~~LOC~~ SOLN
0.0000 [IU] | Freq: Every day | SUBCUTANEOUS | Status: DC
  Administered 2016-05-18: 22:00:00 2 [IU] via SUBCUTANEOUS
  Filled 2016-05-18: qty 2

## 2016-05-18 MED ORDER — LEVOTHYROXINE SODIUM 25 MCG PO TABS
25.0000 ug | ORAL_TABLET | Freq: Every day | ORAL | Status: DC
  Administered 2016-05-18 – 2016-05-19 (×2): 25 ug via ORAL
  Filled 2016-05-18 (×2): qty 1

## 2016-05-18 MED ORDER — ONDANSETRON HCL 4 MG PO TABS: 4.0000 mg | ORAL_TABLET | Freq: Four times a day (QID) | ORAL | Status: DC | PRN

## 2016-05-18 MED ORDER — INSULIN GLARGINE 100 UNIT/ML ~~LOC~~ SOLN
18.0000 [IU] | SUBCUTANEOUS | Status: DC
  Administered 2016-05-18 – 2016-05-19 (×2): 18 [IU] via SUBCUTANEOUS
  Filled 2016-05-18 (×2): qty 0.18

## 2016-05-18 MED ORDER — AZITHROMYCIN 500 MG IV SOLR
500.0000 mg | INTRAVENOUS | Status: DC
  Administered 2016-05-18: 21:00:00 500 mg via INTRAVENOUS
  Filled 2016-05-18 (×2): qty 500

## 2016-05-18 MED ORDER — DEXTROSE 5 % IV SOLN
500.0000 mg | Freq: Once | INTRAVENOUS | Status: AC
  Administered 2016-05-18: 500 mg via INTRAVENOUS
  Filled 2016-05-18: qty 500

## 2016-05-18 MED ORDER — OXYCODONE HCL 5 MG PO TABS: 5.0000 mg | ORAL_TABLET | ORAL | Status: DC | PRN

## 2016-05-18 MED ORDER — ONDANSETRON HCL 4 MG/2ML IJ SOLN: 4.0000 mg | Freq: Four times a day (QID) | INTRAMUSCULAR | Status: DC | PRN

## 2016-05-18 MED ORDER — SENNOSIDES-DOCUSATE SODIUM 8.6-50 MG PO TABS: 1.0000 | ORAL_TABLET | Freq: Every evening | ORAL | Status: DC | PRN

## 2016-05-18 MED ORDER — ENOXAPARIN SODIUM 40 MG/0.4ML ~~LOC~~ SOLN
40.0000 mg | Freq: Every day | SUBCUTANEOUS | Status: DC
  Administered 2016-05-18 – 2016-05-19 (×2): 40 mg via SUBCUTANEOUS
  Filled 2016-05-18 (×2): qty 0.4

## 2016-05-18 MED ORDER — GUAIFENESIN ER 600 MG PO TB12
600.0000 mg | ORAL_TABLET | Freq: Two times a day (BID) | ORAL | Status: DC
  Administered 2016-05-18 – 2016-05-19 (×3): 600 mg via ORAL
  Filled 2016-05-18 (×3): qty 1

## 2016-05-18 MED ORDER — ASPIRIN EC 81 MG PO TBEC
81.0000 mg | DELAYED_RELEASE_TABLET | Freq: Every day | ORAL | Status: DC
  Administered 2016-05-18 – 2016-05-19 (×2): 81 mg via ORAL
  Filled 2016-05-18 (×2): qty 1

## 2016-05-18 MED ORDER — CLOPIDOGREL BISULFATE 75 MG PO TABS
75.0000 mg | ORAL_TABLET | Freq: Every day | ORAL | Status: DC
  Administered 2016-05-18 – 2016-05-19 (×2): 75 mg via ORAL
  Filled 2016-05-18 (×2): qty 1

## 2016-05-18 MED ORDER — FINASTERIDE 5 MG PO TABS
5.0000 mg | ORAL_TABLET | Freq: Every day | ORAL | Status: DC
  Administered 2016-05-18 – 2016-05-19 (×2): 5 mg via ORAL
  Filled 2016-05-18 (×2): qty 1

## 2016-05-18 MED ORDER — ACETAMINOPHEN 325 MG PO TABS: 650.0000 mg | ORAL_TABLET | Freq: Four times a day (QID) | ORAL | Status: DC | PRN

## 2016-05-18 MED ORDER — SODIUM CHLORIDE 0.9 % IV SOLN
INTRAVENOUS | Status: DC
  Administered 2016-05-18: 05:00:00 via INTRAVENOUS

## 2016-05-18 MED ORDER — TAMSULOSIN HCL 0.4 MG PO CAPS
0.4000 mg | ORAL_CAPSULE | Freq: Two times a day (BID) | ORAL | Status: DC
  Administered 2016-05-18 – 2016-05-19 (×3): 0.4 mg via ORAL
  Filled 2016-05-18 (×3): qty 1

## 2016-05-18 MED ORDER — CEFTRIAXONE SODIUM-DEXTROSE 1-3.74 GM-% IV SOLR
1.0000 g | Freq: Once | INTRAVENOUS | Status: AC
  Administered 2016-05-18: 1 g via INTRAVENOUS
  Filled 2016-05-18: qty 50

## 2016-05-18 NOTE — ED Notes (Signed)
Hospitalist at bedside 

## 2016-05-18 NOTE — Progress Notes (Signed)
[facility] [place] NAME: Brian Barry    MR#:  [id number]  DATE OF BIRTH:  1929/07/07  SUBJECTIVE:  CHIEF COMPLAINT:   Chief Complaint  Patient presents with  . Altered Mental Status  . Fall   More awake as per wife. Patient has no shortness of breath or cough.  REVIEW OF SYSTEMS:    Review of Systems  Constitutional: Positive for malaise/fatigue. Negative for chills and fever.  HENT: Negative for sore throat.   Eyes: Negative for blurred vision, double vision and pain.  Respiratory: Negative for cough, hemoptysis, shortness of breath and wheezing.   Cardiovascular: Negative for chest pain, palpitations, orthopnea and leg swelling.  Gastrointestinal: Negative for abdominal pain, constipation, diarrhea, heartburn, nausea and vomiting.  Genitourinary: Negative for dysuria and hematuria.  Musculoskeletal: Negative for back pain and joint pain.  Skin: Negative for rash.  Neurological: Negative for sensory change, speech change, focal weakness and headaches.  Endo/Heme/Allergies: Does not bruise/bleed easily.  Psychiatric/Behavioral: Negative for depression. The patient is not nervous/anxious.     DRUG ALLERGIES:  No Known Allergies  VITALS:  Blood pressure (!) 112/59, pulse 73, temperature 98.3 F (36.8 C), temperature source Oral, resp. rate 18, height 5\' 10"  (1.778 m), weight 69.9 kg (154 lb), SpO2 93 %.  PHYSICAL EXAMINATION:   Physical Exam  GENERAL:  83 y.o.-year-old patient lying in the bed with no acute distress.  EYES: Pupils equal, round, reactive to light and accommodation. No scleral icterus. Extraocular muscles intact.  HEENT: Head atraumatic, normocephalic. Oropharynx and nasopharynx clear.  NECK:  Supple, no jugular venous distention. No thyroid enlargement, no tenderness.  LUNGS: Normal breath sounds bilaterally, no wheezing, rales, rhonchi. No use of accessory muscles of respiration.  CARDIOVASCULAR: S1, S2  normal. No murmurs, rubs, or gallops.  ABDOMEN: Soft, nontender, nondistended. Bowel sounds present. No organomegaly or mass.  EXTREMITIES: No cyanosis, clubbing or edema b/l.    NEUROLOGIC: Cranial nerves II through XII are intact. No focal Motor or sensory deficits b/l.   PSYCHIATRIC: The patient is alert and oriented x 3.  SKIN: No obvious rash, lesion, or ulcer.   LABORATORY PANEL:   CBC  Recent Labs Lab 05/18/16 0425  WBC 6.5  HGB 10.5*  HCT 30.2*  PLT 152   ------------------------------------------------------------------------------------------------------------------ Chemistries   Recent Labs Lab 05/17/16 2121 05/18/16 0425  NA 127* 129*  K 4.2 3.4*  CL 92* 96*  CO2 25 26  GLUCOSE 335* 242*  BUN 15 10  CREATININE 0.94 0.73  CALCIUM 8.9 8.2*  AST 25  --   ALT 15*  --   ALKPHOS 74  --   BILITOT 0.8  --    ------------------------------------------------------------------------------------------------------------------  Cardiac Enzymes  Recent Labs Lab 05/17/16 2121  TROPONINI <0.03   ------------------------------------------------------------------------------------------------------------------  RADIOLOGY:  Dg Chest 2 View  Result Date: 05/17/2016 CLINICAL DATA:  Cough and fever.  Altered mental status. EXAM: CHEST  2 VIEW COMPARISON:  02/11/2009 FINDINGS: Top normal sized cardiac silhouette. Aortic atherosclerosis with slight uncoiling. Mild diffuse interstitial prominence is noted lower lobe predominant. Findings may reflect mild bronchitic change and atelectasis. Slight hyperinflation of the upper lobes causing crowding of lower lobe interstitial lung markings. No pneumonic consolidation, effusion or pneumothorax. No acute nor suspicious osseous abnormalities. IMPRESSION: Mild upper lobe hyperinflation with crowding of lower lobe interstitial lung markings. Slight increase in interstitial markings may reflect bronchitic change. No alveolar  consolidations. Electronically Signed   By: Meredith Leeds.D.  On: 05/17/2016 22:29   Ct Head Wo Contrast  Result Date: 05/17/2016 CLINICAL DATA:  Altered mental status today, confusion and lethargy. [place] getting out of car tonight, struck head on concrete. On anti coagulation. No loss of consciousness. RIGHT forehead abrasion. EXAM: CT HEAD WITHOUT CONTRAST TECHNIQUE: Contiguous axial images were obtained from the base of the skull through the vertex without intravenous contrast. COMPARISON:  None. FINDINGS: BRAIN: The ventricles and sulci are normal for age. No intraparenchymal hemorrhage, mass effect nor midline shift. Patchy supratentorial white matter hypodensities within normal range for patient's age, though non-specific are most compatible with chronic small vessel ischemic disease. Small area RIGHT frontal encephalomalacia. Old bilateral basal ganglia lacunar infarcts. No acute large vascular territory infarcts. No abnormal extra-axial fluid collections. Basal cisterns are patent. VASCULAR: Moderate calcific atherosclerosis of the carotid siphons. SKULL: No skull fracture, osteopenia. No significant scalp soft tissue swelling. Small RIGHT frontal scalp lipoma. SINUSES/ORBITS: The mastoid air-cells and included paranasal sinuses are well-aerated. Status post bilateral ocular lens implants. The included ocular globes and orbital contents are non-suspicious. OTHER: None. IMPRESSION: No acute intracranial process. Chronic changes include old RIGHT frontal lobe infarct, bilateral basal ganglia lacunar infarcts. Electronically Signed   By: Elon Alas M.D.   On: 05/17/2016 22:18   Ct Chest Wo Contrast  Result Date: 05/18/2016 CLINICAL DATA:  Cough beginning yesterday, altered mental status, confusion and lethargy. Fell tonight, head injury. On Plavix. Assess for pneumonia. EXAM: CT CHEST WITHOUT CONTRAST TECHNIQUE: Multidetector CT imaging of the chest was performed following the standard protocol  without IV contrast. COMPARISON:  Chest radiograph May 17, 2016 at 2217 hours FINDINGS: Cardiovascular: Heart size is normal. Severe coronary artery calcifications versus stents. No pericardial effusion. 4.6 cm aneurysmal ascending aorta. Mild calcific atherosclerosis of the aortic arch. Mediastinum/Nodes: 18 mm short access aortopulmonary window lymph node, additional smaller subcarinal lymph nodes; assessment for lymphadenopathy decreased without contrast. Small calcified LEFT subcarinal lymph nodes. Lungs/Pleura: Patchy consolidation bilateral lower lobes, limited by respiratory motion. Focal consolidation LEFT upper lobe, anterior segment. No pleural effusion. Tracheobronchial tree is patent and midline. No pneumothorax. Upper Abdomen: Nonacute Musculoskeletal: Asymmetrically atrophic RIGHT subscapularis muscle. Old LEFT anterior rib fractures. Mild degenerative change of the thoracic spine, mild upper thoracic dextroscoliosis. No destructive bony lesions. LEFT glenoid intraosseous cyst. IMPRESSION: LEFT upper lobe pneumonia.  Bibasilar atelectasis versus pneumonia. 18 mm aortopulmonary window lymph node may be reactive though, recommend close attention on follow-up. **An incidental finding of potential clinical significance has been found. 4.6 cm aneurysmal ascending aorta. Recommend semi-annual imaging followup by CTA or MRA and referral to cardiothoracic surgery if not already obtained. This recommendation follows 2010 ACCF/AHA/AATS/ACR/ASA/SCA/SCAI/SIR/STS/SVM Guidelines for the Diagnosis and Management of Patients With Thoracic Aortic Disease. Circulation. 2010; [id number]** Electronically Signed   By: Elon Alas M.D.   On: 05/18/2016 00:19     ASSESSMENT AND PLAN:   This is a 83 y.o. male with a history of  CVA x2, HTN, HLD, Hypothyroidism, and DM now being admitted with  * Left upper lobe pneumonia with sepsis IV ceftriaxone and azithromycin. Nebulizers when necessary. We'll stop  IV fluids today.  * Diabetes mellitus. Sliding scale insulin.  - Incidental thoracic asc. Aortic aneurysm - CT surgery consult recommended, may pursue as outpatient given size 4.6 cm.  - Hyponatremia, mild - IVF hydration and repeat BMP in AM.   -coronary artery disease, CVA-continue aspirin, Plavix  - History of BPH-continue Proscar and Flomax   Discussed in detail with  wife in the room and with daughter over speaker phone while seeing the patient regarding pneumonia treatment, follow-up for ascending thoracic aortic aneurysm and likely discharge tomorrow.  CODE STATUS: FULL CODE  DVT Prophylaxis: SCDs  TOTAL TIME TAKING CARE OF THIS PATIENT: 35 minutes.   POSSIBLE D/C IN 1-2 DAYS, DEPENDING ON CLINICAL CONDITION.  Hillary Bow R M.D on 05/18/2016 at 1:41 PM  Between 7am to 6pm - Pager - [phone]  After 6pm go to [email or web address] - password [email or web address] [facility]  Office  [phone]  CC: Primary care physician; BABAOFF, Caryl Bis, MD  Note: This dictation was prepared with Dragon dictation along with smaller phrase technology. Any transcriptional errors that result from this process are unintentional.

## 2016-05-18 NOTE — Progress Notes (Signed)
Initial Nutrition Assessment  DOCUMENTATION CODES:   Not applicable  INTERVENTION:  Provide Glucerna Shake po BID, each supplement provides 220 kcal and 10 grams of protein.  Provide snacks po TID between meals, RD to order.  NUTRITION DIAGNOSIS:   Increased nutrient needs related to catabolic illness (sepsis) as evidenced by estimated needs.  GOAL:   Patient will meet greater than or equal to 90% of their needs  MONITOR:   PO intake, Supplement acceptance, Labs, Weight trends, I & O's  REASON FOR ASSESSMENT:   Malnutrition Screening Tool    ASSESSMENT:   83 y.o. male with a known history of CVA x2, HTN, HLD, Hypothyroidism, and DM presents to the emergency department for evaluation of cough, weakness and altered mental status. Patient fell twice PTA. Found to have sepsis secondary to CAP.    Spoke with patient and daughter at bedside. Patient reports he did not eat much of breakfast, but this is because he did not like the food. Reports appetite is good now and PTA. Usual intake of 3 meals per day (pork/beef with vegetables and carbohydrate) with snacks (peanut butter crackers) and Glucerna daily. Denies N/V, abdominal pain, constipation/diarrhea, chewing/swallowing. UBW 155 lbs and patient is weight stable.   Medications reviewed and include: azithromycin, ceftriaxone, Novolog sliding scale TID with meals and daily at bedtime, Lantus 18 units each morning, levothyroxine, NS @ 75 ml/hr.  Labs reviewed: CBG 226, Sodium 129, Potassium 3.4, Chloride 96.   Nutrition-Focused physical exam completed. Findings are no fat depletion, mild muscle depletion, and no edema.   Discussed with RN.   Diet Order:  Diet heart healthy/carb modified Room service appropriate? Yes; Fluid consistency: Thin  Skin:  Reviewed, no issues  Last BM:  05/17/2016  Height:   Ht Readings from Last 1 Encounters:  05/17/16 5\' 10"  (1.778 m)    Weight:   Wt Readings from Last 1 Encounters:   05/17/16 154 lb (69.9 kg)    Ideal Body Weight:  75.5 kg  BMI:  Body mass index is 22.1 kg/m.  Estimated Nutritional Needs:   Kcal:  1800-2000 (MSJ x 1.3-1.4)  Protein:  70-85 grams (1-1.2 grams/kg)  Fluid:  >/= 1.8 L/day (25 ml/kg)  EDUCATION NEEDS:   No education needs identified at this time  Willey Blade, MS, RD, LDN Pager: [phone] After Hours Pager: [phone]

## 2016-05-18 NOTE — Progress Notes (Signed)
Pharmacy Antibiotic Note  CRISOFORO RADWAN is a 83 y.o. male admitted on 05/17/2016 with PNA.  Pharmacy has been consulted for ceftriaxone dosing.  Plan: Ceftriaxone 1 gm IV Q24H  Height: 5\' 10"  (177.8 cm) Weight: 154 lb (69.9 kg) IBW/kg (Calculated) : 73  Temp (24hrs), Avg:99.7 F (37.6 C), Min:98.5 F (36.9 C), Max:100.8 F (38.2 C)   Recent Labs Lab 05/17/16 2121 05/17/16 2153  WBC 8.6  --   CREATININE 0.94  --   LATICACIDVEN  --  1.4    Estimated Creatinine Clearance: 57.8 mL/min (by C-G formula based on SCr of 0.94 mg/dL).    No Known Allergies  Thank you for allowing pharmacy to be a part of this patient's care.  Laural Benes, Pharm.D., BCPS Clinical Pharmacist 05/18/2016 4:10 AM

## 2016-05-18 NOTE — ED Notes (Signed)
ED Provider at bedside. 

## 2016-05-18 NOTE — ED Notes (Signed)
Pt returned from CT °

## 2016-05-18 NOTE — H&P (Signed)
History and Physical   [facility] - [place] @ [facility] Admission History and Physical [employer], D.O.    Patient Name: Brian Barry MR#: [id number] Date of Birth: 11/16/1929 Date of Admission: 05/17/2016  Referring MD/NP/PA: Lavonia Drafts, MD Primary Care Physician: Marcello Fennel, MD Outpatient Specialists: none  Patient coming from: [facility]  Chief Complaint: Cough, weakness, AMS  HPI: Brian Barry is a 83 y.o. male with a known history of CVA x2, HTN, HLD, Hypothyroidism, and DM presents to the emergency department for evaluation of cough, weakness and altered mental status.  Patient was in a usual state of health until two days prior to admission when he developed a cough, increasing confusion and weakness. Cough is non-productive, dry. No hemoptysis. He has been experiencing episodic confusion since his stay in a rehab facility 2/2 hip fracture in May 2017. Patient and family believe that for the past day, he has increased confusion from baseline. Associated with fever, chills, lethargy and dizziness. Denies CP, SOB, dysphagia, nausea, vomiting, diarrhea or abdominal pain.   Patient fell twice prior to admission. Neither fall resulted in LOC, vomiting. The first fall was 12 hours prior to admission with no injuries. Second fall was upon arrival at the ED. During the fall his right temple hit the pavement and resulted in an abrasion. He is on Plavix and aspirin daily.    Patient was recently found to be hypothyroid and was placed on Levothyroxine 25 mcg daily.  Otherwise, patient has been taking medication as prescribed and there has been no recent change in diet. There has been no recent illness, travel or sick contacts.  ED Course: Given IVF, Ceftriaxone and Azithromycin.  Review of Systems:  CONSTITUTIONAL: Admits fever/chills, fatigue, weakness. No weight gain/loss, headache. EYES: No blurry or double vision. ENT: No tinnitus, postnasal drip,  redness or soreness of the oropharynx. RESPIRATORY: Positive for cough. No dyspnea, wheeze, hemoptysis.  CARDIOVASCULAR: No chest pain, palpitations, syncope, orthopnea,  GASTROINTESTINAL: No nausea, vomiting, abdominal pain, constipation, diarrhea.  No hematemesis, melena or hematochezia. GENITOURINARY: No dysuria, frequency, hematuria. ENDOCRINE: No polyuria or nocturia. No heat or cold intolerance. HEMATOLOGY: No anemia, bruising, bleeding. INTEGUMENTARY: No rashes, ulcers, lesions. MUSCULOSKELETAL: No arthritis, gout, dyspnea.  NEUROLOGIC: No numbness, tingling, ataxia, seizure-type activity, weakness. PSYCHIATRIC: No anxiety, depression, insomnia.   Past Medical History:  Diagnosis Date  . DM (diabetes mellitus) ([place])   . Hyperlipidemia   . Hypertension   . Hypothyroidism   . TIA (transient ischemic attack)     Past Surgical History:  Procedure Laterality Date  . bilateral hip replacement    . Left knee replacement    Laminectomy.    reports that he has never smoked. He has never used smokeless tobacco. He reports that he does not drink alcohol. His drug history is not on file.  No Known Allergies  Father died of a CVA at 68 Family history has been reviewed and confirmed with patient.   Prior to Admission medications   Medication Sig Start Date End Date Taking? Authorizing Provider  aspirin EC 81 MG tablet Take 81 mg by mouth daily.    Historical Provider, MD  clopidogrel (PLAVIX) 75 MG tablet Take 75 mg by mouth daily. 07/11/15   Historical Provider, MD  enoxaparin (LOVENOX) 40 MG/0.4ML injection Inject 0.4 mLs (40 mg total) into the skin daily. 09/12/15   Loletha Grayer, MD  finasteride (PROSCAR) 5 MG tablet Take 5 mg by mouth daily. 07/11/15   Historical  Provider, MD  LANTUS SOLOSTAR 100 UNIT/ML Solostar Pen Inject 18 Units into the skin every morning. 06/13/15   Historical Provider, MD  lisinopril (PRINIVIL,ZESTRIL) 20 MG tablet Take 20 mg by mouth daily. 07/11/15    Historical Provider, MD  metFORMIN (GLUCOPHAGE) 500 MG tablet Take 1,000 mg by mouth 2 (two) times daily. 08/12/15   Historical Provider, MD  oxyCODONE (OXY IR/ROXICODONE) 5 MG immediate release tablet Take 1 tablet (5 mg total) by mouth every 4 (four) hours as needed for moderate pain. 09/12/15   Loletha Grayer, MD  polyethylene glycol [facility] / GLYCOLAX) packet Take 17 g by mouth daily. 09/12/15   Loletha Grayer, MD  senna-docusate (SENOKOT-S) 8.6-50 MG tablet Take 1 tablet by mouth at bedtime as needed for mild constipation. 09/12/15   Loletha Grayer, MD  simvastatin (ZOCOR) 20 MG tablet Take 20 mg by mouth at bedtime.  07/11/15   Historical Provider, MD  tamsulosin (FLOMAX) 0.4 MG CAPS capsule Take 0.4 mg by mouth 2 (two) times daily. 08/08/15   Historical Provider, MD    Physical Exam: Vitals:   05/17/16 2104 05/17/16 2132 05/17/16 2243 05/17/16 2330  BP:  (!) 149/72 140/69 137/72  Pulse:  88 77 78  Resp:  (!) 24 (!) 26 (!) 27  Temp:   99.8 F (37.7 C)   TempSrc:   Oral   SpO2:  92% 93% 92%  Weight: 69.9 kg (154 lb)     Height: 5\' 10"  (1.778 m)       GENERAL: 83 y.o.-year-old caucasian patient, well-developed, well-nourished lying in the bed in no acute distress.  Pleasant and cooperative.   HEENT: Abrasion left forehead. Pupils equal, round, reactive to light and accommodation. No scleral icterus. Extraocular muscles intact. Nares are patent. Oropharynx is clear. Mucus membranes dry. NECK: Supple, full range of motion. No JVD, no bruit heard. No thyroid enlargement, no tenderness, no cervical lymphadenopathy. CHEST: Bibasilar crackles.  Good air exchange.. No use of accessory muscles of respiration.  No reproducible chest wall tenderness.  CARDIOVASCULAR: S1, S2 normal. No murmurs, rubs, or gallops. Cap refill <2 seconds. Pulses intact distally.  ABDOMEN: Soft, nondistended, nontender, . No rebound, guarding, rigidity. Normoactive bowel sounds present in all four quadrants. No  organomegaly or mass. EXTREMITIES: No pedal edema, cyanosis, or clubbing. NEUROLOGIC: Cranial nerves II through XII are grossly intact with no focal sensorimotor deficit. Muscle strength 5/5 in all extremities. Sensation intact. Gait not checked. PSYCHIATRIC: The patient is alert and oriented x 3. Normal affect, mood, thought content. SKIN: Warm, dry, and intact without obvious rash, lesion, or ulcer.   Labs on Admission: I have personally reviewed following labs and imaging studies  CBC:  Recent Labs Lab 05/17/16 2121  WBC 8.6  HGB 12.2*  HCT 35.0*  MCV 93.0  PLT 0000000   Basic Metabolic Panel:  Recent Labs Lab 05/17/16 2121  NA 127*  K 4.2  CL 92*  CO2 25  GLUCOSE 335*  BUN 15  CREATININE 0.94  CALCIUM 8.9   GFR: Estimated Creatinine Clearance: 57.8 mL/min (by C-G formula based on SCr of 0.94 mg/dL). Liver Function Tests:  Recent Labs Lab 05/17/16 2121  AST 25  ALT 15*  ALKPHOS 74  BILITOT 0.8  PROT 7.2  ALBUMIN 4.1   No results for input(s): LIPASE, AMYLASE in the last 168 hours. No results for input(s): AMMONIA in the last 168 hours. Coagulation Profile:  Recent Labs Lab 05/17/16 2121  INR 1.04   Cardiac Enzymes:  Recent  Labs Lab 05/17/16 2121  TROPONINI <0.03   BNP (last 3 results) No results for input(s): PROBNP in the last 8760 hours. HbA1C: No results for input(s): HGBA1C in the last 72 hours. CBG: No results for input(s): GLUCAP in the last 168 hours. Lipid Profile: No results for input(s): CHOL, HDL, LDLCALC, TRIG, CHOLHDL, LDLDIRECT in the last 72 hours. Thyroid Function Tests: No results for input(s): TSH, T4TOTAL, FREET4, T3FREE, THYROIDAB in the last 72 hours. Anemia Panel: No results for input(s): VITAMINB12, FOLATE, FERRITIN, TIBC, IRON, RETICCTPCT in the last 72 hours. Urine analysis:    Component Value Date/Time   COLORURINE YELLOW (A) 05/18/2016 0021   APPEARANCEUR CLEAR (A) 05/18/2016 0021   LABSPEC 1.018 05/18/2016  0021   PHURINE 5.0 05/18/2016 0021   GLUCOSEU >=500 (A) 05/18/2016 0021   HGBUR NEGATIVE 05/18/2016 0021   BILIRUBINUR NEGATIVE 05/18/2016 0021   KETONESUR NEGATIVE 05/18/2016 0021   PROTEINUR NEGATIVE 05/18/2016 0021   NITRITE NEGATIVE 05/18/2016 0021   LEUKOCYTESUR NEGATIVE 05/18/2016 0021   Sepsis Labs: @LABRCNTIP (procalcitonin:4,lacticidven:4) ) Recent Results (from the past 240 hour(s))  Rapid Influenza A&B Antigens ([facility] only)     Status: None   Collection Time: 05/17/16 10:35 PM  Result Value Ref Range Status   Influenza A ([facility]) NEGATIVE NEGATIVE Final   Influenza B ([facility]) NEGATIVE NEGATIVE Final     Radiological Exams on Admission: Dg Chest 2 View  Result Date: 05/17/2016 CLINICAL DATA:  Cough and fever.  Altered mental status. EXAM: CHEST  2 VIEW COMPARISON:  02/11/2009 FINDINGS: Top normal sized cardiac silhouette. Aortic atherosclerosis with slight uncoiling. Mild diffuse interstitial prominence is noted lower lobe predominant. Findings may reflect mild bronchitic change and atelectasis. Slight hyperinflation of the upper lobes causing crowding of lower lobe interstitial lung markings. No pneumonic consolidation, effusion or pneumothorax. No acute nor suspicious osseous abnormalities. IMPRESSION: Mild upper lobe hyperinflation with crowding of lower lobe interstitial lung markings. Slight increase in interstitial markings may reflect bronchitic change. No alveolar consolidations. Electronically Signed   By: Ashley Royalty M.D.   On: 05/17/2016 22:29   Ct Head Wo Contrast  Result Date: 05/17/2016 CLINICAL DATA:  Altered mental status today, confusion and lethargy. [place] getting out of car tonight, struck head on concrete. On anti coagulation. No loss of consciousness. RIGHT forehead abrasion. EXAM: CT HEAD WITHOUT CONTRAST TECHNIQUE: Contiguous axial images were obtained from the base of the skull through the vertex without intravenous contrast. COMPARISON:  None. FINDINGS: BRAIN:  The ventricles and sulci are normal for age. No intraparenchymal hemorrhage, mass effect nor midline shift. Patchy supratentorial white matter hypodensities within normal range for patient's age, though non-specific are most compatible with chronic small vessel ischemic disease. Small area RIGHT frontal encephalomalacia. Old bilateral basal ganglia lacunar infarcts. No acute large vascular territory infarcts. No abnormal extra-axial fluid collections. Basal cisterns are patent. VASCULAR: Moderate calcific atherosclerosis of the carotid siphons. SKULL: No skull fracture, osteopenia. No significant scalp soft tissue swelling. Small RIGHT frontal scalp lipoma. SINUSES/ORBITS: The mastoid air-cells and included paranasal sinuses are well-aerated. Status post bilateral ocular lens implants. The included ocular globes and orbital contents are non-suspicious. OTHER: None. IMPRESSION: No acute intracranial process. Chronic changes include old RIGHT frontal lobe infarct, bilateral basal ganglia lacunar infarcts. Electronically Signed   By: Elon Alas M.D.   On: 05/17/2016 22:18   Ct Chest Wo Contrast  Result Date: 05/18/2016 CLINICAL DATA:  Cough beginning yesterday, altered mental status, confusion and lethargy. Fell tonight, head injury. On Plavix.  Assess for pneumonia. EXAM: CT CHEST WITHOUT CONTRAST TECHNIQUE: Multidetector CT imaging of the chest was performed following the standard protocol without IV contrast. COMPARISON:  Chest radiograph May 17, 2016 at 2217 hours FINDINGS: Cardiovascular: Heart size is normal. Severe coronary artery calcifications versus stents. No pericardial effusion. 4.6 cm aneurysmal ascending aorta. Mild calcific atherosclerosis of the aortic arch. Mediastinum/Nodes: 18 mm short access aortopulmonary window lymph node, additional smaller subcarinal lymph nodes; assessment for lymphadenopathy decreased without contrast. Small calcified LEFT subcarinal lymph nodes. Lungs/Pleura:  Patchy consolidation bilateral lower lobes, limited by respiratory motion. Focal consolidation LEFT upper lobe, anterior segment. No pleural effusion. Tracheobronchial tree is patent and midline. No pneumothorax. Upper Abdomen: Nonacute Musculoskeletal: Asymmetrically atrophic RIGHT subscapularis muscle. Old LEFT anterior rib fractures. Mild degenerative change of the thoracic spine, mild upper thoracic dextroscoliosis. No destructive bony lesions. LEFT glenoid intraosseous cyst. IMPRESSION: LEFT upper lobe pneumonia.  Bibasilar atelectasis versus pneumonia. 18 mm aortopulmonary window lymph node may be reactive though, recommend close attention on follow-up. **An incidental finding of potential clinical significance has been found. 4.6 cm aneurysmal ascending aorta. Recommend semi-annual imaging followup by CTA or MRA and referral to cardiothoracic surgery if not already obtained. This recommendation follows 2010 ACCF/AHA/AATS/ACR/ASA/SCA/SCAI/SIR/STS/SVM Guidelines for the Diagnosis and Management of Patients With Thoracic Aortic Disease. Circulation. 2010; [id number]** Electronically Signed   By: Elon Alas M.D.   On: 05/18/2016 00:19    Assessment/Plan   This is a 83 y.o. male with a history of  CVA x2, HTN, HLD, Hypothyroidism, and DM now being admitted with: - Sepsis 2/2 CAP - Admit to inpatient, tele monitoring - IV Rocephin & Azithromycin per pharmacy - IV fluid hydration - Duonebs, expectorants & O2 therapy as needed - Follow up blood & sputum cultures  -DM - RISS, hold Metformin, continue lisinopril  - Hyponatremia, mild - IVF hydration and repeat BMP in AM.   - History of hyperlipidemia - continue Zocor  - History of coronary artery disease, CVA-continue aspirin, Plavix  - History of BPH-continue Proscar and Flomax  - Incidental thoracic asc. Aortic aneurysm - CT surgery consult recommended, may pursue as outpatient given size 4.6 cm.   Admission status: Inpatient,  telemetry monitoring IV Fluids: IV normal saline Diet/Nutrition: Heart healthy, carb controlled Consults called: None  DVT Px: Lovenox, SCDs and early ambulation Code Status: Full Code  Disposition Plan: To home in 1-2 days   All the records are reviewed and case discussed with ED provider. Management plans discussed with the patient and/or family who express understanding and agree with plan of care.  Shantavia Jha D.O. on 05/18/2016 at 2:11 AM Between 7am to 6pm - Pager - [phone] After 6pm go to [email or web address] - Proofreader [facility] [place] [facility] Office [phone] CC: Primary care physician; BABAOFF, Caryl Bis, MD  Harvie Bridge MD [facility] Pager [phone]   If 7PM-7AM, please contact night-coverage [email or web address] Password [email or web address]  05/18/2016, 2:11 AM

## 2016-05-18 NOTE — Evaluation (Signed)
Physical Therapy Evaluation Patient Details Name: Brian Barry MRN: [id number] DOB: 1929/08/02 Today's Date: 05/18/2016   History of Present Illness  Pt is an 83 y.o. male presenting to hospital s/p 2 falls (one of which pt striking head), cough x1-2 days, and AMS.  Pt found to have L upper lobe PNA and admitted with sepsis secondary to community acquired PNA.  Imaging also showing 4.6 cm AAA.  CT head negative for acute intracranial process.  PMH includes CVA x2, htn, DM, L TKA, B THR, and h/o R hip fx 09/10/15.  Clinical Impression  Prior to hospital admission, pt was ambulating with cane during the day and used RW at night.  Pt lives with his wife at [place].  Currently pt is min assist supine to sit and CGA with transfers and ambulation around nursing loop with RW; pt demonstrating increased effort to stand but was steady with transfers and ambulation using RW.  Pt's O2 92% or greater on room air throughout PT session.  Pt would benefit from skilled PT to address noted impairments and functional limitations.  Recommend pt discharge to home with support of wife and HHPT when medically appropriate.    Follow Up Recommendations Home health PT;Supervision for mobility/OOB    Equipment Recommendations  Rolling walker with 5" wheels (pt reports already owning RW)    Recommendations for Other Services       Precautions / Restrictions Precautions Precautions: Fall Restrictions Weight Bearing Restrictions: No      Mobility  Bed Mobility Overal bed mobility: Needs Assistance Bed Mobility: Supine to Sit     Supine to sit: Min assist;HOB elevated     General bed mobility comments: increased effort and time to perform  Transfers Overall transfer level: Needs assistance Equipment used: Rolling walker (2 wheeled) Transfers: Sit to/from Stand Sit to Stand: Min guard         General transfer comment: x2 trials from bed; increased effort to stand noted but  steady without loss of balance  Ambulation/Gait Ambulation/Gait assistance: Min guard Ambulation Distance (Feet):  (190 feet; 20 feet bed to bathroom) Assistive device: Rolling walker (2 wheeled)   Gait velocity: decreased   General Gait Details: mild decreased B step length; steady without loss of balance  Stairs            Wheelchair Mobility    Modified Rankin (Stroke Patients Only)       Balance Overall balance assessment: Needs assistance Sitting-balance support: Bilateral upper extremity supported;Feet supported Sitting balance-Leahy Scale: Good     Standing balance support: Bilateral upper extremity supported (on RW) Standing balance-Leahy Scale: Good                               Pertinent Vitals/Pain Pain Assessment: No/denies pain    Home Living Family/patient expects to be discharged to:: Private residence Living Arrangements: Spouse/significant other Available Help at Discharge: Family;Available PRN/intermittently Type of Home: Independent living facility Home Access: Level entry     Home Layout: One level Home Equipment: Walker - 2 wheels;Cane - single point      Prior Function Level of Independence: Independent with assistive device(s)         Comments: Pt and pt's wife recently moved to [facility].  Pt's wife reports pt has had harder time standing lately but otherwise ambulates with SPC during the day and uses RW at night.  Pt's 2  falls just prior to admission related to falling forward (1 with reaching forward and 1 with leaning forward getting out of car)     Hand Dominance        Extremity/Trunk Assessment   Upper Extremity Assessment Upper Extremity Assessment: Generalized weakness    Lower Extremity Assessment Lower Extremity Assessment: Generalized weakness       Communication   Communication: HOH  Cognition Arousal/Alertness: Awake/alert Behavior During Therapy: WFL for tasks  assessed/performed Overall Cognitive Status: Within Functional Limits for tasks assessed (Some increased time to process noted but may be related to hearing)                      General Comments General comments (skin integrity, edema, etc.): Pt's wife present during session.  Nursing cleared pt for participation in physical therapy.  Pt agreeable to PT session.    Exercises  Ambulation   Assessment/Plan    PT Assessment Patient needs continued PT services  PT Problem List Decreased strength;Decreased activity tolerance;Decreased balance          PT Treatment Interventions DME instruction;Gait training;Functional mobility training;Therapeutic activities;Therapeutic exercise;Balance training;Patient/family education    PT Goals (Current goals can be found in the Care Plan section)  Acute Rehab PT Goals Patient Stated Goal: to feel stronger PT Goal Formulation: With patient Time For Goal Achievement: 06/01/16 Potential to Achieve Goals: Good    Frequency Min 2X/week   Barriers to discharge        Co-evaluation               End of Session Equipment Utilized During Treatment: Gait belt Activity Tolerance: Patient limited by fatigue Patient left:  (sitting on toilet in bathroom with nursing tech present) Nurse Communication: Mobility status;Precautions         Time: 1130-1200 PT Time Calculation (min) (ACUTE ONLY): 30 min   Charges:   PT Evaluation $PT Eval Low Complexity: 1 Procedure PT Treatments $Therapeutic Exercise: 8-22 mins   PT G CodesLeitha Bleak 05-21-16, 3:14 PM Leitha Bleak, [place]

## 2016-05-19 DIAGNOSIS — E871 Hypo-osmolality and hyponatremia: Secondary | ICD-10-CM

## 2016-05-19 DIAGNOSIS — I7121 Aneurysm of the ascending aorta, without rupture: Secondary | ICD-10-CM

## 2016-05-19 DIAGNOSIS — R531 Weakness: Secondary | ICD-10-CM

## 2016-05-19 DIAGNOSIS — I712 Thoracic aortic aneurysm, without rupture: Secondary | ICD-10-CM

## 2016-05-19 LAB — BASIC METABOLIC PANEL
ANION GAP: 6 (ref 5–15)
BUN: 10 mg/dL (ref 6–20)
CALCIUM: 8.7 mg/dL — AB (ref 8.9–10.3)
CO2: 27 mmol/L (ref 22–32)
Chloride: 99 mmol/L — ABNORMAL LOW (ref 101–111)
Creatinine, Ser: 0.69 mg/dL (ref 0.61–1.24)
GFR calc Af Amer: >60 mL/min (ref >60)
GFR calc non Af Amer: >60 mL/min (ref >60)
GLUCOSE: 201 mg/dL — AB (ref 65–99)
Potassium: 4.3 mmol/L (ref 3.5–5.1)
Sodium: 132 mmol/L — ABNORMAL LOW (ref 135–145)

## 2016-05-19 LAB — EXPECTORATED SPUTUM ASSESSMENT W REFEX TO RESP CULTURE

## 2016-05-19 LAB — GLUCOSE, CAPILLARY
Glucose-Capillary: 200 mg/dL — ABNORMAL HIGH (ref 65–99)
Glucose-Capillary: 195 mg/dL — ABNORMAL HIGH (ref 65–99)

## 2016-05-19 LAB — EXPECTORATED SPUTUM ASSESSMENT W GRAM STAIN, RFLX TO RESP C

## 2016-05-19 MED ORDER — GUAIFENESIN ER 600 MG PO TB12: 600.0000 mg | ORAL_TABLET | Freq: Two times a day (BID) | ORAL | 0 refills | Status: DC

## 2016-05-19 MED ORDER — LEVOFLOXACIN 500 MG PO TABS: 500.0000 mg | ORAL_TABLET | Freq: Every day | ORAL | 0 refills | Status: DC

## 2016-05-19 MED ORDER — INSULIN ASPART 100 UNIT/ML FLEXPEN: 5.0000 [IU] | PEN_INJECTOR | Freq: Three times a day (TID) | SUBCUTANEOUS | 11 refills | Status: AC

## 2016-05-19 MED ORDER — DEXTROMETHORPHAN POLISTIREX ER 30 MG/5ML PO SUER: 30.0000 mg | Freq: Two times a day (BID) | ORAL | 0 refills | Status: DC

## 2016-05-19 MED ORDER — GLUCERNA SHAKE PO LIQD: 237.0000 mL | Freq: Two times a day (BID) | ORAL | 5 refills | Status: DC

## 2016-05-19 NOTE — Progress Notes (Signed)
Pt discharged via wheelchair by auxillary to the visitor's entrance 

## 2016-05-19 NOTE — Discharge Summary (Signed)
[facility] [place] NAME: Brian Barry    MR#:  [id number]  DATE OF BIRTH:  [birth year]  DATE OF ADMISSION:  05/17/2016 ADMITTING PHYSICIAN: Brian Glassing Hugelmeyer, DO  DATE OF DISCHARGE: 05/19/2016 11:25 AM  PRIMARY CARE PHYSICIAN: BABAOFF, MARC E, MD     ADMISSION DIAGNOSIS:  Confusion [R41.0] Injury of head, initial encounter [S09.90XA]  DISCHARGE DIAGNOSIS:  Active Problems:   Sepsis due to pneumonia ([place])   Hyponatremia   Generalized weakness   Ascending aortic aneurysm ([place])   SECONDARY DIAGNOSIS:   Past Medical History:  Diagnosis Date  . BPH (benign prostatic hyperplasia)   . DM (diabetes mellitus) ([place])   . Hyperlipidemia   . Hypertension   . Hypothyroidism   . Osteoporosis   . TIA (transient ischemic attack) 1998, 2014    .pro HOSPITAL COURSE:   Patient is a 83 year old male with past medical history significant for history of BPH, diabetes, hypertension, hyperlipidemia, hypothyroidism, TIA, osteoporosis, who presented to the hospital with complaints of cough, weakness, altered mental status for the past 2 days. Call was described as nonproductive, no hemoptysis. Initial labs revealed sodium of 127, mild anemia, hyperglycemia. Influenza test was negative. Urinalysis was normal. Head CT revealed no acute changes, chronic changes after stroke. Chest x-ray revealed increased interstitial markings, hyperinflation. CT of chest showed left upper lobe pneumonia, bibasilar atelectasis versus pneumonia, 4.6 cm ascending aortic aneurysm. Patient was admitted to the hospital for therapy, initiated on antibiotic inhalation therapy and improved clinically. He was felt to be stable to be discharged home today with home health services, which was recommended by physical therapy. Discussion by problem: #1. Left upper lobe pneumonia, patient is to continue Levaquin, Mucinex, Delsym #2. Diabetes mellitus, the patient is to continue  Lantus, short-acting insulin was ordered for him upon discharge due to hyperglycemia, patient was recommended to follow-up with Dr. Gabriel Barry for further recommendations #3. Ascending aortic aneurysm, patient is to follow-up with vascular surgery and will likely require repeated evaluations every 6 months #4. Hyponatremia, improving with therapy, it is recommended to follow patient's sodium level as outpatient to document resolution #5. Generalized weakness, patient is to be followed by home health services, including physical therapy  DISCHARGE CONDITIONS:   Stable  CONSULTS OBTAINED:    DRUG ALLERGIES:  No Known Allergies  DISCHARGE MEDICATIONS:   Discharge Medication List as of 05/19/2016 11:03 AM    START taking these medications   Details  dextromethorphan (DELSYM) 30 MG/5ML liquid Take 5 mLs (30 mg total) by mouth 2 (two) times daily., Starting Sat 05/19/2016, Normal    feeding supplement, GLUCERNA SHAKE, (GLUCERNA SHAKE) LIQD Take 237 mLs by mouth 2 (two) times daily between meals., Starting Sat 05/19/2016, Normal    guaiFENesin (MUCINEX) 600 MG 12 hr tablet Take 1 tablet (600 mg total) by mouth 2 (two) times daily., Starting Sat 05/19/2016, Normal    insulin aspart (NOVOLOG) 100 UNIT/ML FlexPen Inject 5 Units into the skin 3 (three) times daily with meals., Starting Sat 05/19/2016, Normal    levofloxacin (LEVAQUIN) 500 MG tablet Take 1 tablet (500 mg total) by mouth daily., Starting Sat 05/19/2016, Normal      CONTINUE these medications which have NOT CHANGED   Details  aspirin EC 81 MG tablet Take 81 mg by mouth daily., Until Discontinued, Historical Med    clopidogrel (PLAVIX) 75 MG tablet Take 75 mg by mouth daily., Starting 07/11/2015, Until Discontinued, Historical Med    finasteride (PROSCAR)  5 MG tablet Take 5 mg by mouth daily., Starting 07/11/2015, Until Discontinued, Historical Med    LANTUS SOLOSTAR 100 UNIT/ML Solostar Pen Inject 18-20 Units into the skin every morning. ,  Starting Mon 06/13/2015, Historical Med    levothyroxine (SYNTHROID, LEVOTHROID) 25 MCG tablet Take 1 tablet by mouth daily., Starting Thu 04/26/2016, Historical Med    lisinopril (PRINIVIL,ZESTRIL) 20 MG tablet Take 20 mg by mouth daily., Starting 07/11/2015, Until Discontinued, Historical Med    metFORMIN (GLUCOPHAGE) 500 MG tablet Take 1,000 mg by mouth 2 (two) times daily., Starting 08/12/2015, Until Discontinued, Historical Med    polyethylene glycol (MIRALAX / GLYCOLAX) packet Take 17 g by mouth daily., Starting Mon 09/12/2015, Print    senna-docusate (SENOKOT-S) 8.6-50 MG tablet Take 1 tablet by mouth at bedtime as needed for mild constipation., Starting Mon 09/12/2015, Print    simvastatin (ZOCOR) 20 MG tablet Take 20 mg by mouth at bedtime. , Starting 07/11/2015, Until Discontinued, Historical Med    tamsulosin (FLOMAX) 0.4 MG CAPS capsule Take 0.4 mg by mouth 2 (two) times daily., Starting 08/08/2015, Until Discontinued, Historical Med      STOP taking these medications     oxyCODONE (OXY IR/ROXICODONE) 5 MG immediate release tablet          DISCHARGE INSTRUCTIONS:    The patient is to follow-up with primary care physician, vascular surgeon as outpatient  If you experience worsening of your admission symptoms, develop shortness of breath, life threatening emergency, suicidal or homicidal thoughts you must seek medical attention immediately by calling 911 or calling your MD immediately  if symptoms less severe.  You Must read complete instructions/literature along with all the possible adverse reactions/side effects for all the Medicines you take and that have been prescribed to you. Take any new Medicines after you have completely understood and accept all the possible adverse reactions/side effects.   Please note  You were cared for by a hospitalist during your hospital stay. If you have any questions about your discharge medications or the care you received while you were in  the hospital after you are discharged, you can call the unit and asked to speak with the hospitalist on call if the hospitalist that took care of you is not available. Once you are discharged, your primary care physician will handle any further medical issues. Please note that NO REFILLS for any discharge medications will be authorized once you are discharged, as it is imperative that you return to your primary care physician (or establish a relationship with a primary care physician if you do not have one) for your aftercare needs so that they can reassess your need for medications and monitor your lab values.    Today   CHIEF COMPLAINT:   Chief Complaint  Patient presents with  . Altered Mental Status  . Fall    HISTORY OF PRESENT ILLNESS:  Brian Barry  is a 83 y.o. male with a known history of BPH, diabetes, hypertension, hyperlipidemia, hypothyroidism, TIA, osteoporosis, who presented to the hospital with complaints of cough, weakness, altered mental status for the past 2 days. Call was described as nonproductive, no hemoptysis. Initial labs revealed sodium of 127, mild anemia, hyperglycemia. Influenza test was negative. Urinalysis was normal. Head CT revealed no acute changes, chronic changes after stroke. Chest x-ray revealed increased interstitial markings, hyperinflation. CT of chest showed left upper lobe pneumonia, bibasilar atelectasis versus pneumonia, 4.6 cm ascending aortic aneurysm. Patient was admitted to the hospital for therapy, initiated on  antibiotic inhalation therapy and improved clinically. He was felt to be stable to be discharged home today with home health services, which was recommended by physical therapy. Discussion by problem: #1. Left upper lobe pneumonia, patient is to continue Levaquin, Mucinex, Delsym #2. Diabetes mellitus, the patient is to continue Lantus, short-acting insulin was ordered for him upon discharge due to hyperglycemia, patient was recommended to  follow-up with Dr. Gabriel Barry for further recommendations #3. Ascending aortic aneurysm, patient is to follow-up with vascular surgery and will likely require repeated evaluations every 6 months #4. Hyponatremia, improving with therapy, it is recommended to follow patient's sodium level as outpatient to document resolution #5. Generalized weakness, patient is to be followed by home health services, including physical therapy   VITAL SIGNS:  Blood pressure (!) 123/53, pulse (!) 57, temperature 98.3 F (36.8 C), temperature source Oral, resp. rate 19, height 5\' 10"  (1.778 m), weight 69.9 kg (154 lb), SpO2 93 %.  I/O:   Intake/Output Summary (Last 24 hours) at 05/19/16 1556 Last data filed at 05/19/16 0958  Gross per 24 hour  Intake              900 ml  Output              125 ml  Net              775 ml    PHYSICAL EXAMINATION:  GENERAL:  83 y.o.-year-old patient lying in the bed with no acute distress.  EYES: Pupils equal, round, reactive to light and accommodation. No scleral icterus. Extraocular muscles intact.  HEENT: Head atraumatic, normocephalic. Oropharynx and nasopharynx clear.  NECK:  Supple, no jugular venous distention. No thyroid enlargement, no tenderness.  LUNGS: Normal breath sounds bilaterally, no wheezing, rales,rhonchi or crepitation. No use of accessory muscles of respiration.  CARDIOVASCULAR: S1, S2 normal. No murmurs, rubs, or gallops.  ABDOMEN: Soft, non-tender, non-distended. Bowel sounds present. No organomegaly or mass.  EXTREMITIES: No pedal edema, cyanosis, or clubbing.  NEUROLOGIC: Cranial nerves II through XII are intact. Muscle strength 5/5 in all extremities. Sensation intact. Gait not checked.  PSYCHIATRIC: The patient is alert and oriented x 3.  SKIN: No obvious rash, lesion, or ulcer.   DATA REVIEW:   CBC  Recent Labs Lab 05/18/16 0425  WBC 6.5  HGB 10.5*  HCT 30.2*  PLT 152    Chemistries   Recent Labs Lab 05/17/16 2121  05/19/16 0419   NA 127*  < > 132*  K 4.2  < > 4.3  CL 92*  < > 99*  CO2 25  < > 27  GLUCOSE 335*  < > 201*  BUN 15  < > 10  CREATININE 0.94  < > 0.69  CALCIUM 8.9  < > 8.7*  AST 25  --   --   ALT 15*  --   --   ALKPHOS 74  --   --   BILITOT 0.8  --   --   < > = values in this interval not displayed.  Cardiac Enzymes  Recent Labs Lab 05/17/16 2121  TROPONINI <0.03    Microbiology Results  Results for orders placed or performed during the hospital encounter of 05/17/16  Blood culture (routine x 2)     Status: None (Preliminary result)   Collection Time: 05/17/16  9:45 PM  Result Value Ref Range Status   Specimen Description BLOOD  RIGHT FOREARM  Final   Special Requests   Final  BOTTLES DRAWN AEROBIC AND ANAEROBIC  ANA 7ML AER 12ML   Culture NO GROWTH 2 DAYS  Final   Report Status PENDING  Incomplete  Blood culture (routine x 2)     Status: None (Preliminary result)   Collection Time: 05/17/16  9:53 PM  Result Value Ref Range Status   Specimen Description BLOOD  LEFT FOREARM  Final   Special Requests   Final    BOTTLES DRAWN AEROBIC AND ANAEROBIC  ANA 6ML AER 6ML   Culture NO GROWTH 2 DAYS  Final   Report Status PENDING  Incomplete  Rapid Influenza A&B Antigens ([place] only)     Status: None   Collection Time: 05/17/16 10:35 PM  Result Value Ref Range Status   Influenza A ([place]) NEGATIVE NEGATIVE Final   Influenza B ([facility]) NEGATIVE NEGATIVE Final  Culture, sputum-assessment     Status: None   Collection Time: 05/19/16  8:45 AM  Result Value Ref Range Status   Specimen Description SPU  Final   Special Requests Immunocompromised  Final   Sputum evaluation THIS SPECIMEN IS ACCEPTABLE FOR SPUTUM CULTURE  Final   Report Status 05/19/2016 FINAL  Final    RADIOLOGY:  Dg Chest 2 View  Result Date: 05/17/2016 CLINICAL DATA:  Cough and fever.  Altered mental status. EXAM: CHEST  2 VIEW COMPARISON:  02/11/2009 FINDINGS: Top normal sized cardiac silhouette. Aortic atherosclerosis with  slight uncoiling. Mild diffuse interstitial prominence is noted lower lobe predominant. Findings may reflect mild bronchitic change and atelectasis. Slight hyperinflation of the upper lobes causing crowding of lower lobe interstitial lung markings. No pneumonic consolidation, effusion or pneumothorax. No acute nor suspicious osseous abnormalities. IMPRESSION: Mild upper lobe hyperinflation with crowding of lower lobe interstitial lung markings. Slight increase in interstitial markings may reflect bronchitic change. No alveolar consolidations. Electronically Signed   By: Ashley Royalty M.D.   On: 05/17/2016 22:29   Ct Head Wo Contrast  Result Date: 05/17/2016 CLINICAL DATA:  Altered mental status today, confusion and lethargy. [place] getting out of car tonight, struck head on concrete. On anti coagulation. No loss of consciousness. RIGHT forehead abrasion. EXAM: CT HEAD WITHOUT CONTRAST TECHNIQUE: Contiguous axial images were obtained from the base of the skull through the vertex without intravenous contrast. COMPARISON:  None. FINDINGS: BRAIN: The ventricles and sulci are normal for age. No intraparenchymal hemorrhage, mass effect nor midline shift. Patchy supratentorial white matter hypodensities within normal range for patient's age, though non-specific are most compatible with chronic small vessel ischemic disease. Small area RIGHT frontal encephalomalacia. Old bilateral basal ganglia lacunar infarcts. No acute large vascular territory infarcts. No abnormal extra-axial fluid collections. Basal cisterns are patent. VASCULAR: Moderate calcific atherosclerosis of the carotid siphons. SKULL: No skull fracture, osteopenia. No significant scalp soft tissue swelling. Small RIGHT frontal scalp lipoma. SINUSES/ORBITS: The mastoid air-cells and included paranasal sinuses are well-aerated. Status post bilateral ocular lens implants. The included ocular globes and orbital contents are non-suspicious. OTHER: None. IMPRESSION:  No acute intracranial process. Chronic changes include old RIGHT frontal lobe infarct, bilateral basal ganglia lacunar infarcts. Electronically Signed   By: Elon Alas M.D.   On: 05/17/2016 22:18   Ct Chest Wo Contrast  Result Date: 05/18/2016 CLINICAL DATA:  Cough beginning yesterday, altered mental status, confusion and lethargy. Fell tonight, head injury. On Plavix. Assess for pneumonia. EXAM: CT CHEST WITHOUT CONTRAST TECHNIQUE: Multidetector CT imaging of the chest was performed following the standard protocol without IV contrast. COMPARISON:  Chest radiograph May 17, 2016  at 2217 hours FINDINGS: Cardiovascular: Heart size is normal. Severe coronary artery calcifications versus stents. No pericardial effusion. 4.6 cm aneurysmal ascending aorta. Mild calcific atherosclerosis of the aortic arch. Mediastinum/Nodes: 18 mm short access aortopulmonary window lymph node, additional smaller subcarinal lymph nodes; assessment for lymphadenopathy decreased without contrast. Small calcified LEFT subcarinal lymph nodes. Lungs/Pleura: Patchy consolidation bilateral lower lobes, limited by respiratory motion. Focal consolidation LEFT upper lobe, anterior segment. No pleural effusion. Tracheobronchial tree is patent and midline. No pneumothorax. Upper Abdomen: Nonacute Musculoskeletal: Asymmetrically atrophic RIGHT subscapularis muscle. Old LEFT anterior rib fractures. Mild degenerative change of the thoracic spine, mild upper thoracic dextroscoliosis. No destructive bony lesions. LEFT glenoid intraosseous cyst. IMPRESSION: LEFT upper lobe pneumonia.  Bibasilar atelectasis versus pneumonia. 18 mm aortopulmonary window lymph node may be reactive though, recommend close attention on follow-up. **An incidental finding of potential clinical significance has been found. 4.6 cm aneurysmal ascending aorta. Recommend semi-annual imaging followup by CTA or MRA and referral to cardiothoracic surgery if not already  obtained. This recommendation follows 2010 ACCF/AHA/AATS/ACR/ASA/SCA/SCAI/SIR/STS/SVM Guidelines for the Diagnosis and Management of Patients With Thoracic Aortic Disease. Circulation. 2010; [id number]** Electronically Signed   By: Elon Alas M.D.   On: 05/18/2016 00:19    EKG:   Orders placed or performed during the hospital encounter of 05/17/16  . EKG 12-Lead  . EKG 12-Lead      Management plans discussed with the patient, family and they are in agreement.  CODE STATUS:  Code Status History    Date Active Date Inactive Code Status Order ID Comments User Context   05/18/2016  3:57 AM 05/19/2016  2:38 PM Full Code [id number]  Harvie Bridge, DO Inpatient   09/10/2015  4:04 PM 09/12/2015  7:46 PM Full Code [id number]  Nicholes Mango, MD Inpatient    Advance Directive Documentation   [place] Most Recent Value  Type of Advance Directive  Living will  Pre-existing out of facility DNR order (yellow form or pink MOST form)  No data  "MOST" Form in Place?  No data      TOTAL TIME TAKING CARE OF THIS PATIENT: 40 minutes.    Theodoro Grist M.D on 05/19/2016 at 3:56 PM  Between 7am to 6pm - Pager - [phone]  After 6pm go to [email or web address] - password [email or web address] [facility]  Office  [phone]  CC: Primary care physician; BABAOFF, Caryl Bis, MD

## 2016-05-19 NOTE — Progress Notes (Signed)
MD order received to discharge pt home today; verbally reviewed AVS with pt and pt's spouse, no questions voiced at this time

## 2016-05-19 NOTE — Discharge Instructions (Signed)
[facility] RN and Physical Therapy services

## 2016-05-19 NOTE — Care Management Note (Signed)
Case Management Note  Patient Details  Name: Brian Barry MRN: [id number] Date of Birth: [birth year]  Subjective/Objective:          Discussed discharge planning with [name]. She chose [facility] to be [name]'s home health PT and RN provider. A referral was faxed to [name] requesting home health PT and RN services.           Action/Plan:   Expected Discharge Date:                  Expected Discharge Plan:     In-House Referral:     Discharge planning Services     Post Acute Care Choice:    Choice offered to:     DME Arranged:    DME Agency:     HH Arranged:    HH Agency:     Status of Service:     If discussed at [employer] of Stay Meetings, dates discussed:    Additional Comments:  [name] A, RN 05/19/2016, 9:13 AM

## 2016-05-21 LAB — CULTURE, RESPIRATORY W GRAM STAIN: Culture: NORMAL

## 2016-05-21 LAB — CULTURE, RESPIRATORY

## 2016-05-22 DIAGNOSIS — I10 Essential (primary) hypertension: Secondary | ICD-10-CM | POA: Diagnosis not present

## 2016-05-22 DIAGNOSIS — Z96643 Presence of artificial hip joint, bilateral: Secondary | ICD-10-CM | POA: Diagnosis not present

## 2016-05-22 DIAGNOSIS — E039 Hypothyroidism, unspecified: Secondary | ICD-10-CM | POA: Diagnosis not present

## 2016-05-22 DIAGNOSIS — G9341 Metabolic encephalopathy: Secondary | ICD-10-CM | POA: Diagnosis not present

## 2016-05-22 DIAGNOSIS — E119 Type 2 diabetes mellitus without complications: Secondary | ICD-10-CM | POA: Diagnosis not present

## 2016-05-22 DIAGNOSIS — Z9181 History of falling: Secondary | ICD-10-CM | POA: Diagnosis not present

## 2016-05-22 DIAGNOSIS — Z8673 Personal history of transient ischemic attack (TIA), and cerebral infarction without residual deficits: Secondary | ICD-10-CM | POA: Diagnosis not present

## 2016-05-22 DIAGNOSIS — E785 Hyperlipidemia, unspecified: Secondary | ICD-10-CM | POA: Diagnosis not present

## 2016-05-22 DIAGNOSIS — J181 Lobar pneumonia, unspecified organism: Secondary | ICD-10-CM | POA: Diagnosis not present

## 2016-05-22 LAB — CULTURE, BLOOD (ROUTINE X 2)
CULTURE: NO GROWTH
Culture: NO GROWTH

## 2016-05-23 DIAGNOSIS — Z8673 Personal history of transient ischemic attack (TIA), and cerebral infarction without residual deficits: Secondary | ICD-10-CM | POA: Diagnosis not present

## 2016-05-23 DIAGNOSIS — I10 Essential (primary) hypertension: Secondary | ICD-10-CM | POA: Diagnosis not present

## 2016-05-23 DIAGNOSIS — Z96643 Presence of artificial hip joint, bilateral: Secondary | ICD-10-CM | POA: Diagnosis not present

## 2016-05-23 DIAGNOSIS — E039 Hypothyroidism, unspecified: Secondary | ICD-10-CM | POA: Diagnosis not present

## 2016-05-23 DIAGNOSIS — Z9181 History of falling: Secondary | ICD-10-CM | POA: Diagnosis not present

## 2016-05-23 DIAGNOSIS — E785 Hyperlipidemia, unspecified: Secondary | ICD-10-CM | POA: Diagnosis not present

## 2016-05-23 DIAGNOSIS — G9341 Metabolic encephalopathy: Secondary | ICD-10-CM | POA: Diagnosis not present

## 2016-05-23 DIAGNOSIS — J181 Lobar pneumonia, unspecified organism: Secondary | ICD-10-CM | POA: Diagnosis not present

## 2016-05-23 DIAGNOSIS — E119 Type 2 diabetes mellitus without complications: Secondary | ICD-10-CM | POA: Diagnosis not present

## 2016-05-24 DIAGNOSIS — Z96643 Presence of artificial hip joint, bilateral: Secondary | ICD-10-CM | POA: Diagnosis not present

## 2016-05-24 DIAGNOSIS — E039 Hypothyroidism, unspecified: Secondary | ICD-10-CM | POA: Diagnosis not present

## 2016-05-24 DIAGNOSIS — Z8673 Personal history of transient ischemic attack (TIA), and cerebral infarction without residual deficits: Secondary | ICD-10-CM | POA: Diagnosis not present

## 2016-05-24 DIAGNOSIS — E119 Type 2 diabetes mellitus without complications: Secondary | ICD-10-CM | POA: Diagnosis not present

## 2016-05-24 DIAGNOSIS — E1165 Type 2 diabetes mellitus with hyperglycemia: Secondary | ICD-10-CM | POA: Diagnosis not present

## 2016-05-24 DIAGNOSIS — I10 Essential (primary) hypertension: Secondary | ICD-10-CM | POA: Diagnosis not present

## 2016-05-24 DIAGNOSIS — G9341 Metabolic encephalopathy: Secondary | ICD-10-CM | POA: Diagnosis not present

## 2016-05-24 DIAGNOSIS — Z9181 History of falling: Secondary | ICD-10-CM | POA: Diagnosis not present

## 2016-05-24 DIAGNOSIS — J181 Lobar pneumonia, unspecified organism: Secondary | ICD-10-CM | POA: Diagnosis not present

## 2016-05-24 DIAGNOSIS — E871 Hypo-osmolality and hyponatremia: Secondary | ICD-10-CM | POA: Diagnosis not present

## 2016-05-24 DIAGNOSIS — Z794 Long term (current) use of insulin: Secondary | ICD-10-CM | POA: Diagnosis not present

## 2016-05-24 DIAGNOSIS — E785 Hyperlipidemia, unspecified: Secondary | ICD-10-CM | POA: Diagnosis not present

## 2016-05-25 DIAGNOSIS — E119 Type 2 diabetes mellitus without complications: Secondary | ICD-10-CM | POA: Diagnosis not present

## 2016-05-25 DIAGNOSIS — E039 Hypothyroidism, unspecified: Secondary | ICD-10-CM | POA: Diagnosis not present

## 2016-05-25 DIAGNOSIS — G9341 Metabolic encephalopathy: Secondary | ICD-10-CM | POA: Diagnosis not present

## 2016-05-25 DIAGNOSIS — Z96643 Presence of artificial hip joint, bilateral: Secondary | ICD-10-CM | POA: Diagnosis not present

## 2016-05-25 DIAGNOSIS — Z8673 Personal history of transient ischemic attack (TIA), and cerebral infarction without residual deficits: Secondary | ICD-10-CM | POA: Diagnosis not present

## 2016-05-25 DIAGNOSIS — I10 Essential (primary) hypertension: Secondary | ICD-10-CM | POA: Diagnosis not present

## 2016-05-25 DIAGNOSIS — E785 Hyperlipidemia, unspecified: Secondary | ICD-10-CM | POA: Diagnosis not present

## 2016-05-25 DIAGNOSIS — J181 Lobar pneumonia, unspecified organism: Secondary | ICD-10-CM | POA: Diagnosis not present

## 2016-05-25 DIAGNOSIS — Z9181 History of falling: Secondary | ICD-10-CM | POA: Diagnosis not present

## 2016-05-28 DIAGNOSIS — R296 Repeated falls: Secondary | ICD-10-CM | POA: Diagnosis not present

## 2016-05-28 DIAGNOSIS — Z96643 Presence of artificial hip joint, bilateral: Secondary | ICD-10-CM | POA: Diagnosis not present

## 2016-05-28 DIAGNOSIS — E785 Hyperlipidemia, unspecified: Secondary | ICD-10-CM | POA: Diagnosis not present

## 2016-05-28 DIAGNOSIS — R262 Difficulty in walking, not elsewhere classified: Secondary | ICD-10-CM | POA: Diagnosis not present

## 2016-05-28 DIAGNOSIS — E119 Type 2 diabetes mellitus without complications: Secondary | ICD-10-CM | POA: Diagnosis not present

## 2016-05-28 DIAGNOSIS — G9341 Metabolic encephalopathy: Secondary | ICD-10-CM | POA: Diagnosis not present

## 2016-05-28 DIAGNOSIS — Z8673 Personal history of transient ischemic attack (TIA), and cerebral infarction without residual deficits: Secondary | ICD-10-CM | POA: Diagnosis not present

## 2016-05-28 DIAGNOSIS — J189 Pneumonia, unspecified organism: Secondary | ICD-10-CM | POA: Diagnosis not present

## 2016-05-28 DIAGNOSIS — I10 Essential (primary) hypertension: Secondary | ICD-10-CM | POA: Diagnosis not present

## 2016-05-28 DIAGNOSIS — E039 Hypothyroidism, unspecified: Secondary | ICD-10-CM | POA: Diagnosis not present

## 2016-05-28 DIAGNOSIS — J181 Lobar pneumonia, unspecified organism: Secondary | ICD-10-CM | POA: Diagnosis not present

## 2016-05-28 DIAGNOSIS — Z9181 History of falling: Secondary | ICD-10-CM | POA: Diagnosis not present

## 2016-05-29 DIAGNOSIS — E039 Hypothyroidism, unspecified: Secondary | ICD-10-CM | POA: Diagnosis not present

## 2016-05-29 DIAGNOSIS — G9341 Metabolic encephalopathy: Secondary | ICD-10-CM | POA: Diagnosis not present

## 2016-05-29 DIAGNOSIS — Z9181 History of falling: Secondary | ICD-10-CM | POA: Diagnosis not present

## 2016-05-29 DIAGNOSIS — E119 Type 2 diabetes mellitus without complications: Secondary | ICD-10-CM | POA: Diagnosis not present

## 2016-05-29 DIAGNOSIS — J181 Lobar pneumonia, unspecified organism: Secondary | ICD-10-CM | POA: Diagnosis not present

## 2016-05-29 DIAGNOSIS — I10 Essential (primary) hypertension: Secondary | ICD-10-CM | POA: Diagnosis not present

## 2016-05-29 DIAGNOSIS — E785 Hyperlipidemia, unspecified: Secondary | ICD-10-CM | POA: Diagnosis not present

## 2016-05-29 DIAGNOSIS — Z8673 Personal history of transient ischemic attack (TIA), and cerebral infarction without residual deficits: Secondary | ICD-10-CM | POA: Diagnosis not present

## 2016-05-29 DIAGNOSIS — Z96643 Presence of artificial hip joint, bilateral: Secondary | ICD-10-CM | POA: Diagnosis not present

## 2016-05-30 ENCOUNTER — Emergency Department: Payer: Medicare HMO

## 2016-05-30 ENCOUNTER — Emergency Department
Admission: EM | Admit: 2016-05-30 | Discharge: 2016-05-30 | Disposition: A | Discharge status: Home / Self Care | Payer: Medicare HMO | Attending: Student in an Organized Health Care Education/Training Program | Admitting: Student in an Organized Health Care Education/Training Program

## 2016-05-30 ENCOUNTER — Encounter: Payer: Self-pay | Admitting: Emergency Medicine

## 2016-05-30 DIAGNOSIS — I10 Essential (primary) hypertension: Secondary | ICD-10-CM | POA: Diagnosis not present

## 2016-05-30 DIAGNOSIS — E119 Type 2 diabetes mellitus without complications: Secondary | ICD-10-CM | POA: Insufficient documentation

## 2016-05-30 DIAGNOSIS — Z7982 Long term (current) use of aspirin: Secondary | ICD-10-CM | POA: Diagnosis not present

## 2016-05-30 DIAGNOSIS — R42 Dizziness and giddiness: Secondary | ICD-10-CM | POA: Diagnosis not present

## 2016-05-30 DIAGNOSIS — Z79899 Other long term (current) drug therapy: Secondary | ICD-10-CM | POA: Insufficient documentation

## 2016-05-30 DIAGNOSIS — E860 Dehydration: Secondary | ICD-10-CM | POA: Diagnosis not present

## 2016-05-30 DIAGNOSIS — E039 Hypothyroidism, unspecified: Secondary | ICD-10-CM | POA: Diagnosis not present

## 2016-05-30 DIAGNOSIS — Z794 Long term (current) use of insulin: Secondary | ICD-10-CM | POA: Insufficient documentation

## 2016-05-30 DIAGNOSIS — R739 Hyperglycemia, unspecified: Secondary | ICD-10-CM | POA: Diagnosis not present

## 2016-05-30 LAB — CBC WITH DIFFERENTIAL/PLATELET
BASOS PCT: 1 %
Basophils Absolute: 0.1 10*3/uL (ref 0–0.1)
Eosinophils Absolute: 0 10*3/uL (ref 0–0.7)
Eosinophils Relative: 0 %
HCT: 34.3 % — ABNORMAL LOW (ref 40.0–52.0)
HEMOGLOBIN: 12 g/dL — AB (ref 13.0–18.0)
LYMPHS ABS: 0.3 10*3/uL — AB (ref 1.0–3.6)
Lymphocytes Relative: 4 %
MCH: 32.5 pg (ref 26.0–34.0)
MCHC: 35.1 g/dL (ref 32.0–36.0)
MCV: 92.7 fL (ref 80.0–100.0)
MONO ABS: 0.2 10*3/uL (ref 0.2–1.0)
MONOS PCT: 3 %
NEUTROS PCT: 92 %
Neutro Abs: 6.9 10*3/uL — ABNORMAL HIGH (ref 1.4–6.5)
Platelets: 261 10*3/uL (ref 150–440)
RBC: 3.7 MIL/uL — ABNORMAL LOW (ref 4.40–5.90)
RDW: 13.4 % (ref 11.5–14.5)
WBC: 7.6 10*3/uL (ref 3.8–10.6)

## 2016-05-30 LAB — COMPREHENSIVE METABOLIC PANEL
ALBUMIN: 3.8 g/dL (ref 3.5–5.0)
ALK PHOS: 61 U/L (ref 38–126)
ALT: 13 U/L — ABNORMAL LOW (ref 17–63)
ANION GAP: 8 (ref 5–15)
AST: 25 U/L (ref 15–41)
BILIRUBIN TOTAL: 0.3 mg/dL (ref 0.3–1.2)
BUN: 12 mg/dL (ref 6–20)
CALCIUM: 8.7 mg/dL — AB (ref 8.9–10.3)
CO2: 28 mmol/L (ref 22–32)
Chloride: 94 mmol/L — ABNORMAL LOW (ref 101–111)
Creatinine, Ser: 0.57 mg/dL — ABNORMAL LOW (ref 0.61–1.24)
GFR calc non Af Amer: >60 mL/min (ref >60)
Glucose, Bld: 290 mg/dL — ABNORMAL HIGH (ref 65–99)
POTASSIUM: 4.1 mmol/L (ref 3.5–5.1)
SODIUM: 130 mmol/L — AB (ref 135–145)
TOTAL PROTEIN: 6.7 g/dL (ref 6.5–8.1)

## 2016-05-30 LAB — GLUCOSE, CAPILLARY: Glucose-Capillary: 232 mg/dL — ABNORMAL HIGH (ref 65–99)

## 2016-05-30 LAB — TROPONIN I: Troponin I: <0.03 ng/mL (ref <0.03)

## 2016-05-30 MED ORDER — MECLIZINE HCL 25 MG PO TABS: 25.0000 mg | ORAL_TABLET | Freq: Three times a day (TID) | ORAL | 0 refills | Status: DC | PRN

## 2016-05-30 MED ORDER — MECLIZINE HCL 25 MG PO TABS
25.0000 mg | ORAL_TABLET | Freq: Once | ORAL | Status: AC
  Administered 2016-05-30: 25 mg via ORAL
  Filled 2016-05-30: qty 1

## 2016-05-30 MED ORDER — SODIUM CHLORIDE 0.9 % IV BOLUS (SEPSIS)
500.0000 mL | Freq: Once | INTRAVENOUS | Status: AC
  Administered 2016-05-30: 500 mL via INTRAVENOUS

## 2016-05-30 NOTE — ED Notes (Signed)
Patient transported to CT 

## 2016-05-30 NOTE — ED Triage Notes (Signed)
Pt in via EMS from [facility] with complaints of dizziness since 0500 today.  Pt reports dizziness has not gotten any better all day, worse with standing, to the point of not being able to walk.  Pt also reports high blood sugar today, EMS reading 361.  Pt denies any hx of vertigo, reports hx of TIA, currently on Plavix.  Pt A/Ox4, hypertensive, other vitals WDL.  MD at bedside.

## 2016-05-30 NOTE — ED Provider Notes (Signed)
[facility] Emergency Department Provider Note    None    (approximate)  I have reviewed the triage vital signs and the nursing notes.   HISTORY  Chief Complaint Dizziness and Hyperglycemia    HPI Brian Barry is a 83 y.o. male with history of diabetes and hypertension as well as TIA presents with dizziness and lightheadedness that started around 5:00 this morning when the patient was getting out of bed. Describes it as sudden onset of spinning of the room and lightheadedness when he was standing up. States when he states still for some time the symptoms improved. He is taking his blood sugar today noted it was elevated. Denies any numbness or tingling. No nausea or vomiting. No recent fevers. Denies any chest pain, palpitations or shortness of breath. Currently on Plavix for history of TIA but denies any falls or head injury. Typically walks with a walker. States that he has not had anything to drink today. Wife, at bedside, states that it is a frequent issue trying to keep him well-hydrated.   Past Medical History:  Diagnosis Date  . BPH (benign prostatic hyperplasia)   . DM (diabetes mellitus) ([place])   . Hyperlipidemia   . Hypertension   . Hypothyroidism   . Osteoporosis   . TIA (transient ischemic attack) 1998, 2014   Family History  Problem Relation Age of Onset  . CVA Father 40   Past Surgical History:  Procedure Laterality Date  . bilateral hip replacement Bilateral 1998  . Left knee replacement Left 2007  . LUMBAR LAMINECTOMY  2010  . right hip revision Right 2008   Patient Active Problem List   Diagnosis Date Noted  . Ascending aortic aneurysm ([place]) 05/19/2016  . Hyponatremia 05/19/2016  . Generalized weakness 05/19/2016  . Sepsis due to pneumonia ([place]) 05/18/2016  . Hip fracture ([employer]) 09/10/2015      Prior to Admission medications   Medication Sig Start Date End Date Taking? Authorizing Provider  aspirin EC 81 MG tablet Take  81 mg by mouth daily.    Historical Provider, MD  clopidogrel (PLAVIX) 75 MG tablet Take 75 mg by mouth daily. 07/11/15   Historical Provider, MD  dextromethorphan (DELSYM) 30 MG/5ML liquid Take 5 mLs (30 mg total) by mouth 2 (two) times daily. 05/19/16   Theodoro Grist, MD  feeding supplement, GLUCERNA SHAKE, (GLUCERNA SHAKE) LIQD Take 237 mLs by mouth 2 (two) times daily between meals. 05/19/16   Theodoro Grist, MD  finasteride (PROSCAR) 5 MG tablet Take 5 mg by mouth daily. 07/11/15   Historical Provider, MD  guaiFENesin (MUCINEX) 600 MG 12 hr tablet Take 1 tablet (600 mg total) by mouth 2 (two) times daily. 05/19/16   Theodoro Grist, MD  insulin aspart (NOVOLOG) 100 UNIT/ML FlexPen Inject 5 Units into the skin 3 (three) times daily with meals. 05/19/16   Theodoro Grist, MD  LANTUS SOLOSTAR 100 UNIT/ML Solostar Pen Inject 18-20 Units into the skin every morning.  06/13/15   Historical Provider, MD  levofloxacin (LEVAQUIN) 500 MG tablet Take 1 tablet (500 mg total) by mouth daily. 05/19/16   Theodoro Grist, MD  levothyroxine (SYNTHROID, LEVOTHROID) 25 MCG tablet Take 1 tablet by mouth daily. 04/26/16   Historical Provider, MD  lisinopril (PRINIVIL,ZESTRIL) 20 MG tablet Take 20 mg by mouth daily. 07/11/15   Historical Provider, MD  metFORMIN (GLUCOPHAGE) 500 MG tablet Take 1,000 mg by mouth 2 (two) times daily. 08/12/15   Historical Provider, MD  polyethylene glycol (  MIRALAX / GLYCOLAX) packet Take 17 g by mouth daily. 09/12/15   Loletha Grayer, MD  senna-docusate (SENOKOT-S) 8.6-50 MG tablet Take 1 tablet by mouth at bedtime as needed for mild constipation. 09/12/15   Loletha Grayer, MD  simvastatin (ZOCOR) 20 MG tablet Take 20 mg by mouth at bedtime.  07/11/15   Historical Provider, MD  tamsulosin (FLOMAX) 0.4 MG CAPS capsule Take 0.4 mg by mouth 2 (two) times daily. 08/08/15   Historical Provider, MD    Allergies Patient has no known allergies.    Social History Social History  Substance Use Topics  . Smoking  status: Never Smoker  . Smokeless tobacco: Never Used  . Alcohol use No    Review of Systems Patient denies headaches, rhinorrhea, blurry vision, numbness, shortness of breath, chest pain, edema, cough, abdominal pain, nausea, vomiting, diarrhea, dysuria, fevers, rashes or hallucinations unless otherwise stated above in HPI. ____________________________________________   PHYSICAL EXAM:  VITAL SIGNS: Vitals:   05/30/16 1656  BP: (!) 174/94  Pulse: 63  Resp: 17  Temp: 97.7 F (36.5 C)    Constitutional: Alert and oriented. in no acute distress. Eyes: Conjunctivae are normal. PERRL. EOMI. Head: Atraumatic. Nose: No congestion/rhinnorhea. Mouth/Throat: Mucous membranes are moist.  Oropharynx non-erythematous. Neck: No stridor. Painless ROM. No cervical spine tenderness to palpation Hematological/Lymphatic/Immunilogical: No cervical lymphadenopathy. Cardiovascular: Normal rate, regular rhythm. Grossly normal heart sounds.  Good peripheral circulation. Respiratory: Normal respiratory effort.  No retractions. Lungs CTAB. Gastrointestinal: Soft and nontender. No distention. No abdominal bruits. No CVA tenderness.  Musculoskeletal: No lower extremity tenderness nor edema.  No joint effusions. Neurologic:CN- intact.  No facial droop, Normal FNF.  Normal heel to shin.  Sensation intact bilaterally. Normal speech and language. No gross focal neurologic deficits are appreciated. No gait instability with use of walker assistive device  Skin:  Skin is warm, dry and intact. No rash noted. Psychiatric: Mood and affect are normal. Speech and behavior are normal.  ____________________________________________   LABS (all labs ordered are listed, but only abnormal results are displayed)  Results for orders placed or performed during the hospital encounter of 05/30/16 (from the past 24 hour(s))  CBC with Differential/Platelet     Status: Abnormal   Collection Time: 05/30/16  5:29 PM  Result  Value Ref Range   WBC 7.6 3.8 - 10.6 K/uL   RBC 3.70 (L) 4.40 - 5.90 MIL/uL   Hemoglobin 12.0 (L) 13.0 - 18.0 g/dL   HCT 34.3 (L) 40.0 - 52.0 %   MCV 92.7 80.0 - 100.0 fL   MCH 32.5 26.0 - 34.0 pg   MCHC 35.1 32.0 - 36.0 g/dL   RDW 13.4 11.5 - 14.5 %   Platelets 261 150 - 440 K/uL   Neutrophils Relative % 92 %   Neutro Abs 6.9 (H) 1.4 - 6.5 K/uL   Lymphocytes Relative 4 %   Lymphs Abs 0.3 (L) 1.0 - 3.6 K/uL   Monocytes Relative 3 %   Monocytes Absolute 0.2 0.2 - 1.0 K/uL   Eosinophils Relative 0 %   Eosinophils Absolute 0.0 0 - 0.7 K/uL   Basophils Relative 1 %   Basophils Absolute 0.1 0 - 0.1 K/uL  Comprehensive metabolic panel     Status: Abnormal   Collection Time: 05/30/16  5:29 PM  Result Value Ref Range   Sodium 130 (L) 135 - 145 mmol/L   Potassium 4.1 3.5 - 5.1 mmol/L   Chloride 94 (L) 101 - 111 mmol/L   CO2 28  22 - 32 mmol/L   Glucose, Bld 290 (H) 65 - 99 mg/dL   BUN 12 6 - 20 mg/dL   Creatinine, Ser 0.57 (L) 0.61 - 1.24 mg/dL   Calcium 8.7 (L) 8.9 - 10.3 mg/dL   Total Protein 6.7 6.5 - 8.1 g/dL   Albumin 3.8 3.5 - 5.0 g/dL   AST 25 15 - 41 U/L   ALT 13 (L) 17 - 63 U/L   Alkaline Phosphatase 61 38 - 126 U/L   Total Bilirubin 0.3 0.3 - 1.2 mg/dL   GFR calc non Af Amer >60 >60 mL/min   GFR calc Af Amer >60 >60 mL/min   Anion gap 8 5 - 15  Troponin I     Status: None   Collection Time: 05/30/16  5:29 PM  Result Value Ref Range   Troponin I <0.03 <0.03 ng/mL  Glucose, capillary     Status: Abnormal   Collection Time: 05/30/16  8:10 PM  Result Value Ref Range   Glucose-Capillary 232 (H) 65 - 99 mg/dL   ____________________________________________  EKG My review and personal interpretation at Time: 16:52   Indication: dizziness  Rate: 70  Rhythm: sinus Axis: normal Other: non specific st changes, no acute ischemia, normal intervals ____________________________________________  RADIOLOGY  I personally reviewed all radiographic images ordered to evaluate for  the above acute complaints and reviewed radiology reports and findings.  These findings were personally discussed with the patient.  Please see medical record for radiology report. ____________________________________________   PROCEDURES  Procedure(s) performed:  Procedures    Critical Care performed: no ____________________________________________   INITIAL IMPRESSION / ASSESSMENT AND PLAN / ED COURSE  Pertinent labs & imaging results that were available during my care of the patient were reviewed by me and considered in my medical decision making (see chart for details).  DDX: bppv, vertigo, dehydration, dka, cva, tia, medication effect  HERLEY COMTE is a 83 y.o. who presents to the ED with complaint of dizziness with acute onset this morning. Patient afebrile and hemodynamically stable. He has a nonfocal neuro exam. Symptoms of sudden onset of dizziness that resolves after short time sounds very consistent with acute vertigo. We'll order CT head to evaluate for mass or CVA. We'll obtain blood work as well as provide IV fluids for component of dehydration. Will check EKG and troponin to evaluate for any evidence of underlying ischemia the patient denies any chest pain or shortness of breath. We will also evaluate for any evidence of DKA.  Clinical Course as of May 31 2051  Wed May 30, 2016  1802 CXR reassuring. Ct head with NAICA  [PR]  [id number] Patient was able to ambulate with a walker. States his dizziness has improved after meclizine and oral hydration. Repeat neuro exam is nonfocal. Patient also admits to not having anything to drink today. We'll give bolus. We'll reassess.  [PR]  1948 Patient with improvement in symptoms after IV bolus and tolerating oral hydration. Patient still feeling mildly lightheaded when standing but is markedly improved. I did recommend admission hospital for additional IV fluids and monitoring for TIA however after discussion with family, the patient  states that he would prefer to go home and sleep in his own bed. As he has a nonfocal neuro exam and presentation is most consistent with some component of vertigo possible dehydration do feel that this is an appropriate option. Wife demonstrates good understanding of signs or symptoms for which the patient should return. He denies any  chest pain. He is not showing any evidence of infectious process. He is tolerating oral hydration. Patient requesting DC home.  Discussed follow-up with primary care physician  [PR]    Clinical Course User Index [PR] Merlyn Lot, MD     ____________________________________________   FINAL CLINICAL IMPRESSION(S) / ED DIAGNOSES  Final diagnoses:  Dizziness  Dehydration      NEW MEDICATIONS STARTED DURING THIS VISIT:  New Prescriptions   No medications on file     Note:  This document was prepared using Dragon voice recognition software and may include unintentional dictation errors.    Merlyn Lot, MD 05/30/16 2059

## 2016-06-01 DIAGNOSIS — E119 Type 2 diabetes mellitus without complications: Secondary | ICD-10-CM | POA: Diagnosis not present

## 2016-06-01 DIAGNOSIS — G9341 Metabolic encephalopathy: Secondary | ICD-10-CM | POA: Diagnosis not present

## 2016-06-01 DIAGNOSIS — E039 Hypothyroidism, unspecified: Secondary | ICD-10-CM | POA: Diagnosis not present

## 2016-06-01 DIAGNOSIS — Z8673 Personal history of transient ischemic attack (TIA), and cerebral infarction without residual deficits: Secondary | ICD-10-CM | POA: Diagnosis not present

## 2016-06-01 DIAGNOSIS — J181 Lobar pneumonia, unspecified organism: Secondary | ICD-10-CM | POA: Diagnosis not present

## 2016-06-01 DIAGNOSIS — Z9181 History of falling: Secondary | ICD-10-CM | POA: Diagnosis not present

## 2016-06-01 DIAGNOSIS — I10 Essential (primary) hypertension: Secondary | ICD-10-CM | POA: Diagnosis not present

## 2016-06-01 DIAGNOSIS — Z96643 Presence of artificial hip joint, bilateral: Secondary | ICD-10-CM | POA: Diagnosis not present

## 2016-06-01 DIAGNOSIS — E785 Hyperlipidemia, unspecified: Secondary | ICD-10-CM | POA: Diagnosis not present

## 2016-06-04 DIAGNOSIS — I10 Essential (primary) hypertension: Secondary | ICD-10-CM | POA: Diagnosis not present

## 2016-06-04 DIAGNOSIS — Z8673 Personal history of transient ischemic attack (TIA), and cerebral infarction without residual deficits: Secondary | ICD-10-CM | POA: Diagnosis not present

## 2016-06-04 DIAGNOSIS — E039 Hypothyroidism, unspecified: Secondary | ICD-10-CM | POA: Diagnosis not present

## 2016-06-04 DIAGNOSIS — G9341 Metabolic encephalopathy: Secondary | ICD-10-CM | POA: Diagnosis not present

## 2016-06-04 DIAGNOSIS — E785 Hyperlipidemia, unspecified: Secondary | ICD-10-CM | POA: Diagnosis not present

## 2016-06-04 DIAGNOSIS — Z9181 History of falling: Secondary | ICD-10-CM | POA: Diagnosis not present

## 2016-06-04 DIAGNOSIS — E119 Type 2 diabetes mellitus without complications: Secondary | ICD-10-CM | POA: Diagnosis not present

## 2016-06-04 DIAGNOSIS — Z96643 Presence of artificial hip joint, bilateral: Secondary | ICD-10-CM | POA: Diagnosis not present

## 2016-06-04 DIAGNOSIS — J181 Lobar pneumonia, unspecified organism: Secondary | ICD-10-CM | POA: Diagnosis not present

## 2016-06-05 DIAGNOSIS — E785 Hyperlipidemia, unspecified: Secondary | ICD-10-CM | POA: Diagnosis not present

## 2016-06-05 DIAGNOSIS — G9341 Metabolic encephalopathy: Secondary | ICD-10-CM | POA: Diagnosis not present

## 2016-06-05 DIAGNOSIS — Z96643 Presence of artificial hip joint, bilateral: Secondary | ICD-10-CM | POA: Diagnosis not present

## 2016-06-05 DIAGNOSIS — E119 Type 2 diabetes mellitus without complications: Secondary | ICD-10-CM | POA: Diagnosis not present

## 2016-06-05 DIAGNOSIS — J181 Lobar pneumonia, unspecified organism: Secondary | ICD-10-CM | POA: Diagnosis not present

## 2016-06-05 DIAGNOSIS — E039 Hypothyroidism, unspecified: Secondary | ICD-10-CM | POA: Diagnosis not present

## 2016-06-05 DIAGNOSIS — Z8673 Personal history of transient ischemic attack (TIA), and cerebral infarction without residual deficits: Secondary | ICD-10-CM | POA: Diagnosis not present

## 2016-06-05 DIAGNOSIS — I10 Essential (primary) hypertension: Secondary | ICD-10-CM | POA: Diagnosis not present

## 2016-06-05 DIAGNOSIS — Z9181 History of falling: Secondary | ICD-10-CM | POA: Diagnosis not present

## 2016-06-07 DIAGNOSIS — Z96643 Presence of artificial hip joint, bilateral: Secondary | ICD-10-CM | POA: Diagnosis not present

## 2016-06-07 DIAGNOSIS — J181 Lobar pneumonia, unspecified organism: Secondary | ICD-10-CM | POA: Diagnosis not present

## 2016-06-07 DIAGNOSIS — E039 Hypothyroidism, unspecified: Secondary | ICD-10-CM | POA: Diagnosis not present

## 2016-06-07 DIAGNOSIS — E119 Type 2 diabetes mellitus without complications: Secondary | ICD-10-CM | POA: Diagnosis not present

## 2016-06-07 DIAGNOSIS — E785 Hyperlipidemia, unspecified: Secondary | ICD-10-CM | POA: Diagnosis not present

## 2016-06-07 DIAGNOSIS — I10 Essential (primary) hypertension: Secondary | ICD-10-CM | POA: Diagnosis not present

## 2016-06-07 DIAGNOSIS — Z9181 History of falling: Secondary | ICD-10-CM | POA: Diagnosis not present

## 2016-06-07 DIAGNOSIS — Z8673 Personal history of transient ischemic attack (TIA), and cerebral infarction without residual deficits: Secondary | ICD-10-CM | POA: Diagnosis not present

## 2016-06-07 DIAGNOSIS — G9341 Metabolic encephalopathy: Secondary | ICD-10-CM | POA: Diagnosis not present

## 2016-06-08 DIAGNOSIS — Z9181 History of falling: Secondary | ICD-10-CM | POA: Diagnosis not present

## 2016-06-08 DIAGNOSIS — E119 Type 2 diabetes mellitus without complications: Secondary | ICD-10-CM | POA: Diagnosis not present

## 2016-06-08 DIAGNOSIS — Z96643 Presence of artificial hip joint, bilateral: Secondary | ICD-10-CM | POA: Diagnosis not present

## 2016-06-08 DIAGNOSIS — G9341 Metabolic encephalopathy: Secondary | ICD-10-CM | POA: Diagnosis not present

## 2016-06-08 DIAGNOSIS — E785 Hyperlipidemia, unspecified: Secondary | ICD-10-CM | POA: Diagnosis not present

## 2016-06-08 DIAGNOSIS — J181 Lobar pneumonia, unspecified organism: Secondary | ICD-10-CM | POA: Diagnosis not present

## 2016-06-08 DIAGNOSIS — E039 Hypothyroidism, unspecified: Secondary | ICD-10-CM | POA: Diagnosis not present

## 2016-06-08 DIAGNOSIS — Z8673 Personal history of transient ischemic attack (TIA), and cerebral infarction without residual deficits: Secondary | ICD-10-CM | POA: Diagnosis not present

## 2016-06-08 DIAGNOSIS — I10 Essential (primary) hypertension: Secondary | ICD-10-CM | POA: Diagnosis not present

## 2016-06-12 ENCOUNTER — Emergency Department
Admission: EM | Admit: 2016-06-12 | Discharge: 2016-06-12 | Disposition: A | Discharge status: Home / Self Care | Payer: Medicare HMO | Attending: Emergency Medicine | Admitting: Emergency Medicine

## 2016-06-12 ENCOUNTER — Emergency Department: Payer: Medicare HMO

## 2016-06-12 DIAGNOSIS — R2689 Other abnormalities of gait and mobility: Secondary | ICD-10-CM | POA: Diagnosis not present

## 2016-06-12 DIAGNOSIS — I719 Aortic aneurysm of unspecified site, without rupture: Secondary | ICD-10-CM | POA: Diagnosis not present

## 2016-06-12 DIAGNOSIS — Z9181 History of falling: Secondary | ICD-10-CM | POA: Diagnosis not present

## 2016-06-12 DIAGNOSIS — Z794 Long term (current) use of insulin: Secondary | ICD-10-CM | POA: Diagnosis not present

## 2016-06-12 DIAGNOSIS — W010XXA Fall on same level from slipping, tripping and stumbling without subsequent striking against object, initial encounter: Secondary | ICD-10-CM | POA: Insufficient documentation

## 2016-06-12 DIAGNOSIS — I10 Essential (primary) hypertension: Secondary | ICD-10-CM | POA: Diagnosis not present

## 2016-06-12 DIAGNOSIS — Z7982 Long term (current) use of aspirin: Secondary | ICD-10-CM | POA: Insufficient documentation

## 2016-06-12 DIAGNOSIS — Y999 Unspecified external cause status: Secondary | ICD-10-CM | POA: Diagnosis not present

## 2016-06-12 DIAGNOSIS — M21371 Foot drop, right foot: Secondary | ICD-10-CM | POA: Diagnosis not present

## 2016-06-12 DIAGNOSIS — R262 Difficulty in walking, not elsewhere classified: Secondary | ICD-10-CM

## 2016-06-12 DIAGNOSIS — E119 Type 2 diabetes mellitus without complications: Secondary | ICD-10-CM | POA: Diagnosis not present

## 2016-06-12 DIAGNOSIS — E871 Hypo-osmolality and hyponatremia: Secondary | ICD-10-CM | POA: Diagnosis not present

## 2016-06-12 DIAGNOSIS — S32511A Fracture of superior rim of right pubis, initial encounter for closed fracture: Secondary | ICD-10-CM | POA: Diagnosis not present

## 2016-06-12 DIAGNOSIS — M25552 Pain in left hip: Secondary | ICD-10-CM | POA: Diagnosis not present

## 2016-06-12 DIAGNOSIS — S3289XA Fracture of other parts of pelvis, initial encounter for closed fracture: Secondary | ICD-10-CM

## 2016-06-12 DIAGNOSIS — Y9289 Other specified places as the place of occurrence of the external cause: Secondary | ICD-10-CM | POA: Insufficient documentation

## 2016-06-12 DIAGNOSIS — Z79899 Other long term (current) drug therapy: Secondary | ICD-10-CM | POA: Diagnosis not present

## 2016-06-12 DIAGNOSIS — N400 Enlarged prostate without lower urinary tract symptoms: Secondary | ICD-10-CM | POA: Diagnosis not present

## 2016-06-12 DIAGNOSIS — Z96642 Presence of left artificial hip joint: Secondary | ICD-10-CM | POA: Diagnosis not present

## 2016-06-12 DIAGNOSIS — S32592A Other specified fracture of left pubis, initial encounter for closed fracture: Secondary | ICD-10-CM | POA: Insufficient documentation

## 2016-06-12 DIAGNOSIS — S3992XA Unspecified injury of lower back, initial encounter: Secondary | ICD-10-CM | POA: Diagnosis present

## 2016-06-12 DIAGNOSIS — W19XXXA Unspecified fall, initial encounter: Secondary | ICD-10-CM | POA: Diagnosis not present

## 2016-06-12 DIAGNOSIS — E039 Hypothyroidism, unspecified: Secondary | ICD-10-CM | POA: Diagnosis not present

## 2016-06-12 DIAGNOSIS — R296 Repeated falls: Secondary | ICD-10-CM

## 2016-06-12 DIAGNOSIS — Y939 Activity, unspecified: Secondary | ICD-10-CM | POA: Diagnosis not present

## 2016-06-12 DIAGNOSIS — M6281 Muscle weakness (generalized): Secondary | ICD-10-CM | POA: Diagnosis not present

## 2016-06-12 DIAGNOSIS — S3289XD Fracture of other parts of pelvis, subsequent encounter for fracture with routine healing: Secondary | ICD-10-CM | POA: Diagnosis not present

## 2016-06-12 DIAGNOSIS — R2681 Unsteadiness on feet: Secondary | ICD-10-CM | POA: Diagnosis not present

## 2016-06-12 MED ORDER — HYDROCODONE-ACETAMINOPHEN 5-325 MG PO TABS
ORAL_TABLET | ORAL | Status: AC
  Filled 2016-06-12: qty 1

## 2016-06-12 MED ORDER — HYDROCODONE-ACETAMINOPHEN 5-325 MG PO TABS: 1.0000 | ORAL_TABLET | ORAL | 0 refills | Status: DC | PRN

## 2016-06-12 MED ORDER — HYDROCODONE-ACETAMINOPHEN 5-325 MG PO TABS
1.0000 | ORAL_TABLET | Freq: Once | ORAL | Status: AC
  Administered 2016-06-12: 1 via ORAL

## 2016-06-12 NOTE — NC FL2 (Signed)
[place] LEVEL OF CARE SCREENING TOOL     IDENTIFICATION  Patient Name: Brian Barry Birthdate: 10-31-1929 Sex: male Admission Date (Current Location): 06/12/2016  [place] and [place] Number:  Engineering geologist and Address:  [facility], [address], [place], [place] [address]      Provider Number: [id number]  Attending Physician Name and Address:  Lavonia Drafts, MD  Relative Name and Phone Number:       Current Level of Care: Hospital Recommended Level of Care: [place] Prior Approval Number:    Date Approved/Denied: 06/12/16 PASRR Number: [id number]  Discharge Plan: SNF    Current Diagnoses: Patient Active Problem List   Diagnosis Date Noted  . Ascending aortic aneurysm ([place]) 05/19/2016  . Hyponatremia 05/19/2016  . Generalized weakness 05/19/2016  . Sepsis due to pneumonia ([place]) 05/18/2016  . Hip fracture ([place]) 09/10/2015    Orientation RESPIRATION BLADDER Height & Weight     Self, Time, Situation, Place  Normal Incontinent Weight: 150 lb (68 kg) Height:  5\' 9"  (175.3 cm)  BEHAVIORAL SYMPTOMS/MOOD NEUROLOGICAL BOWEL NUTRITION STATUS     (None) Continent Diet (Heart healthy)  AMBULATORY STATUS COMMUNICATION OF NEEDS Skin   Extensive Assist Verbally Normal                       Personal Care Assistance Level of Assistance  Bathing, Feeding, Dressing Bathing Assistance: Limited assistance Feeding assistance: Independent Dressing Assistance: Limited assistance     Functional Limitations Info  Sight, Speech, Hearing Sight Info: Adequate Hearing Info: Impaired (Hard of hearing ) Speech Info: Adequate    SPECIAL CARE FACTORS FREQUENCY  PT (By licensed PT), OT (By licensed OT)     PT Frequency: 5x OT Frequency: 5x            Contractures Contractures Info: Not present    Additional Factors Info  Code Status, Allergies Code Status Info: Full Code  Allergies Info: No known  allergies            Current Medications (06/12/2016):  This is the current hospital active medication list No current facility-administered medications for this encounter.    Current Outpatient Prescriptions  Medication Sig Dispense Refill  . aspirin EC 81 MG tablet Take 81 mg by mouth daily.    . calcium-vitamin D (OSCAL WITH D) 500-200 MG-UNIT tablet Take 1 tablet by mouth daily.    . clopidogrel (PLAVIX) 75 MG tablet Take 75 mg by mouth daily.    Marland Kitchen dextromethorphan (DELSYM) 30 MG/5ML liquid Take 5 mLs (30 mg total) by mouth 2 (two) times daily. 89 mL 0  . finasteride (PROSCAR) 5 MG tablet Take 5 mg by mouth daily.    Marland Kitchen LANTUS SOLOSTAR 100 UNIT/ML Solostar Pen Inject 18 Units into the skin every morning.     Marland Kitchen levothyroxine (SYNTHROID, LEVOTHROID) 25 MCG tablet Take 1 tablet by mouth daily.    Marland Kitchen lisinopril (PRINIVIL,ZESTRIL) 20 MG tablet Take 20 mg by mouth daily.    . meclizine (ANTIVERT) 25 MG tablet Take 1 tablet (25 mg total) by mouth 3 (three) times daily as needed. 6 tablet 0  . metFORMIN (GLUCOPHAGE) 500 MG tablet Take 500 mg by mouth 2 (two) times daily.     . polyethylene glycol (MIRALAX / GLYCOLAX) packet Take 17 g by mouth daily. 14 each 0  . senna-docusate (SENOKOT-S) 8.6-50 MG tablet Take 1 tablet by mouth at bedtime as needed for mild  constipation. 30 tablet 0  . simvastatin (ZOCOR) 20 MG tablet Take 20 mg by mouth at bedtime.     . tamsulosin (FLOMAX) 0.4 MG CAPS capsule Take 0.4 mg by mouth 2 (two) times daily.    . feeding supplement, GLUCERNA SHAKE, (GLUCERNA SHAKE) LIQD Take 237 mLs by mouth 2 (two) times daily between meals. 60 Can 5  . insulin aspart (NOVOLOG) 100 UNIT/ML FlexPen Inject 5 Units into the skin 3 (three) times daily with meals. 15 mL 11     Discharge Medications: Please see discharge summary for a list of discharge medications.  Relevant Imaging Results:  Relevant Lab Results:   Additional Information SSN # [name],  [place]

## 2016-06-12 NOTE — ED Notes (Signed)
CSW received consult for possible placement. Pt resides at [facility] and may now require SNF level care. CSW spoke with [name] from [place] [phone]). Pt can return to the facility on the SNF side.   CSW spoke with [facility] about the pt and a PT consult is being ordered for the pt. Pending PT recommendations, CSW will assist in disposition of pt to the appropriate level of care. CSW will send referrals for pt to SNF in preparation.   Georga Kaufmann, MSW, [place]

## 2016-06-12 NOTE — Evaluation (Signed)
Physical Therapy Evaluation Patient Details Name: Brian Barry MRN: [id number] DOB: Apr 22, 1930 Today's Date: 06/12/2016   History of Present Illness  presented to ER status post fall in home with L hip pain; noted with L inferior and superior pubic rami fractures.  Of note, recent hospitalization (early January 2018) secondary to sepsis, PNA; has been receiving HHPT since discharge.  Clinical Impression  Upon evaluation, patient alert and oriented; follows all commands and demonstrates good efforts with all therex/theract.  L hip movement generally guarded due to pain, 2/10 at rest and 5/10 with WBing.  Currently requiring min assist for bed mobility, sit/stand, basic transfers and short-distance gait (30') with RW.  Displays step to gait pattern with decreased stance time/weight acceptance to L LE, limited hip/knee flexion throughout gait cycle.  Decreased balance reactions; increased fall risk evident.  Recommend continued use of RW and +1 assist with all mobility at this time. Would benefit from skilled PT to address above deficits and promote optimal return to PLOF; recommend transition to STR upon discharge from acute hospitalization.     Follow Up Recommendations SNF    Equipment Recommendations       Recommendations for Other Services       Precautions / Restrictions Precautions Precautions: Fall Restrictions Weight Bearing Restrictions: No      Mobility  Bed Mobility Overal bed mobility: Needs Assistance Bed Mobility: Supine to Sit;Sit to Supine     Supine to sit: Min assist Sit to supine: Min assist      Transfers Overall transfer level: Needs assistance Equipment used: Rolling walker (2 wheeled) Transfers: Sit to/from Stand Sit to Stand: Min assist         General transfer comment: min cuing for foot placement to maximize safety/indep with functional transfers  Ambulation/Gait Ambulation/Gait assistance: Min assist Ambulation Distance (Feet): 25  Feet Assistive device: Rolling walker (2 wheeled)       General Gait Details: step to gait pattern with decreased WBing, weight acceptance L LE; maintains L LE in guarded position with limited hip/knee flexion throughout gait cycle.  Stairs            Wheelchair Mobility    Modified Rankin (Stroke Patients Only)       Balance Overall balance assessment: Needs assistance Sitting-balance support: Feet supported;No upper extremity supported Sitting balance-Leahy Scale: Good     Standing balance support: Bilateral upper extremity supported Standing balance-Leahy Scale: Fair                               Pertinent Vitals/Pain Pain Assessment: 0-10 Pain Score: 2  Pain Location: L hip, LE Pain Descriptors / Indicators: Aching;Grimacing;Guarding Pain Intervention(s): Limited activity within patient's tolerance;Monitored during session;Repositioned    Home Living Family/patient expects to be discharged to:: Private residence Living Arrangements: Spouse/significant other Available Help at Discharge: Family Type of Home: Independent living facility Home Access: Level entry     Home Layout: One level Home Equipment: Environmental consultant - 2 wheels;Cane - single point      Prior Function Level of Independence: Independent with assistive device(s)         Comments: Resident of independent living at [facility]; mod indep with [phone] for ADLs, household and limited community mobility.  Actively participating with HHPT prior to admission.  Endorses 3-4 falls within previous six months.     Hand Dominance        Extremity/Trunk Assessment   Upper Extremity  Assessment Upper Extremity Assessment: Overall WFL for tasks assessed    Lower Extremity Assessment Lower Extremity Assessment: Generalized weakness (R LE grossly 4/5 except R ankle DF 3-/5 (baseline foot drop due to back injury); L LE hip 3-/5, knee and ankle 4/5)       Communication   Communication: HOH   Cognition Arousal/Alertness: Awake/alert Behavior During Therapy: WFL for tasks assessed/performed Overall Cognitive Status: Within Functional Limits for tasks assessed                      General Comments      Exercises     Assessment/Plan    PT Assessment Patient needs continued PT services  PT Problem List Decreased strength;Decreased range of motion;Decreased activity tolerance;Decreased balance;Decreased mobility;Decreased safety awareness;Decreased knowledge of precautions;Decreased knowledge of use of DME;Pain          PT Treatment Interventions DME instruction;Gait training;Functional mobility training;Therapeutic activities;Therapeutic exercise;Balance training;Patient/family education    PT Goals (Current goals can be found in the Care Plan section)  Acute Rehab PT Goals Patient Stated Goal: to transition to rehab PT Goal Formulation: With patient/family Time For Goal Achievement: 06/26/16 Potential to Achieve Goals: Good    Frequency 7X/week   Barriers to discharge Decreased caregiver support      Co-evaluation               End of Session Equipment Utilized During Treatment: Gait belt Activity Tolerance: Patient tolerated treatment well Patient left: in bed;with call bell/phone within reach;with family/visitor present Nurse Communication: Mobility status    Functional Assessment Tool Used: clinical judgement Functional Limitation: Mobility: Walking and moving around Mobility: Walking and Moving Around Current Status [id number]): At least 20 percent but less than 40 percent impaired, limited or restricted Mobility: Walking and Moving Around Goal Status [phone]): At least 1 percent but less than 20 percent impaired, limited or restricted    Time: 1357-1414 PT Time Calculation (min) (ACUTE ONLY): 17 min   Charges:   PT Evaluation $PT Eval Low Complexity: 1 Procedure     PT G Codes:   PT G-Codes **NOT FOR INPATIENT CLASS** Functional  Assessment Tool Used: clinical judgement Functional Limitation: Mobility: Walking and moving around Mobility: Walking and Moving Around Current Status [id number]): At least 20 percent but less than 40 percent impaired, limited or restricted Mobility: Walking and Moving Around Goal Status [phone]): At least 1 percent but less than 20 percent impaired, limited or restricted   Chole Driver H. Owens Shark, PT, DPT, NCS 06/12/16, 3:05 PM [phone]

## 2016-06-12 NOTE — Clinical Social Work Placement (Signed)
   CLINICAL SOCIAL WORK PLACEMENT  NOTE  Date:  06/12/2016  Patient Details  Name: Brian Barry MRN: [id number] Date of Birth: [birth year]  Clinical Social Work is seeking post-discharge placement for this patient at the [place] level of care (*CSW will initial, date and re-position this form in  chart as items are completed):  Yes   Patient/family provided with [place] Work Department's list of facilities offering this level of care within the geographic area requested by the patient (or if unable, by the patient's family).  Yes   Patient/family informed of their freedom to choose among providers that offer the needed level of care, that participate in Medicare, Medicaid or managed care program needed by the patient, have an available bed and are willing to accept the patient.  Yes   Patient/family informed of [place]'s ownership interest in [facility] and [facility], as well as of the fact that they are under no obligation to receive care at these facilities.  PASRR submitted to EDS on       PASRR number received on       Existing PASRR number confirmed on 06/12/16     FL2 transmitted to all facilities in geographic area requested by pt/family on 06/12/16     FL2 transmitted to all facilities within larger geographic area on       Patient informed that his/her managed care company has contracts with or will negotiate with certain facilities, including the following:   [employer], [place] )     Yes   Patient/family informed of bed offers received.  Patient chooses bed at  [facility] )     Physician recommends and patient chooses bed at      Patient to be transferred to  [facility] ) on 06/12/16.  Patient to be transferred to facility by  (Family will transport )     Patient family notified on 06/12/16 of transfer.  Name of family member notified:  [name] (daughter) [phone]      PHYSICIAN Please prepare priority discharge summary, including medications     Additional Comment:   Pt will discharge to [facility]. EDP and RN notified. Pt will be in room 229. RN can call report to [phone]. Family will transport.  _______________________________________________ Georga Kaufmann, [place] 06/12/2016, 2:41 PM

## 2016-06-12 NOTE — ED Notes (Signed)
Patients wife requested food for patient. Patient given crackers and peanut butter with diet ginger ale per request

## 2016-06-12 NOTE — ED Triage Notes (Signed)
Pt comes into the ED via EMS from [facility] independent living with c/o trip and fall last night and this morning is having a lot of left hip pain, was able to ambulate with assistance.

## 2016-06-12 NOTE — ED Provider Notes (Signed)
[facility] Emergency Department Provider Note   ____________________________________________    I have reviewed the triage vital signs and the nursing notes.   HISTORY  Chief Complaint Fall and Hip Pain     HPI Brian Barry is a 83 y.o. male who presents after a fall. Patient reports he tripped on the carpet last night and fell squarely on his left hip. Reports a history of a hip replacement 5 years ago. This was performed by Dr. Marry Guan. He was able to get up with significant help by his wife and get into bed. This morning he continues to have significant painand is unable to ambulate or bear significant weight. No other injuries reported   Past Medical History:  Diagnosis Date  . BPH (benign prostatic hyperplasia)   . DM (diabetes mellitus) ([place])   . Hyperlipidemia   . Hypertension   . Hypothyroidism   . Osteoporosis   . TIA (transient ischemic attack) 1998, 2014    Patient Active Problem List   Diagnosis Date Noted  . Ascending aortic aneurysm ([place]) 05/19/2016  . Hyponatremia 05/19/2016  . Generalized weakness 05/19/2016  . Sepsis due to pneumonia ([place]) 05/18/2016  . Hip fracture ([place]) 09/10/2015    Past Surgical History:  Procedure Laterality Date  . bilateral hip replacement Bilateral 1998  . Left knee replacement Left 2007  . LUMBAR LAMINECTOMY  2010  . right hip revision Right 2008    Prior to Admission medications   Medication Sig Start Date End Date Taking? Authorizing Provider  aspirin EC 81 MG tablet Take 81 mg by mouth daily.    Historical Provider, MD  clopidogrel (PLAVIX) 75 MG tablet Take 75 mg by mouth daily. 07/11/15   Historical Provider, MD  dextromethorphan (DELSYM) 30 MG/5ML liquid Take 5 mLs (30 mg total) by mouth 2 (two) times daily. 05/19/16   Theodoro Grist, MD  feeding supplement, GLUCERNA SHAKE, (GLUCERNA SHAKE) LIQD Take 237 mLs by mouth 2 (two) times daily between meals. 05/19/16   Theodoro Grist, MD    finasteride (PROSCAR) 5 MG tablet Take 5 mg by mouth daily. 07/11/15   Historical Provider, MD  guaiFENesin (MUCINEX) 600 MG 12 hr tablet Take 1 tablet (600 mg total) by mouth 2 (two) times daily. 05/19/16   Theodoro Grist, MD  insulin aspart (NOVOLOG) 100 UNIT/ML FlexPen Inject 5 Units into the skin 3 (three) times daily with meals. 05/19/16   Theodoro Grist, MD  LANTUS SOLOSTAR 100 UNIT/ML Solostar Pen Inject 18-20 Units into the skin every morning.  06/13/15   Historical Provider, MD  levofloxacin (LEVAQUIN) 500 MG tablet Take 1 tablet (500 mg total) by mouth daily. 05/19/16   Theodoro Grist, MD  levothyroxine (SYNTHROID, LEVOTHROID) 25 MCG tablet Take 1 tablet by mouth daily. 04/26/16   Historical Provider, MD  lisinopril (PRINIVIL,ZESTRIL) 20 MG tablet Take 20 mg by mouth daily. 07/11/15   Historical Provider, MD  meclizine (ANTIVERT) 25 MG tablet Take 1 tablet (25 mg total) by mouth 3 (three) times daily as needed. 05/30/16   Merlyn Lot, MD  metFORMIN (GLUCOPHAGE) 500 MG tablet Take 1,000 mg by mouth 2 (two) times daily. 08/12/15   Historical Provider, MD  polyethylene glycol (MIRALAX / GLYCOLAX) packet Take 17 g by mouth daily. 09/12/15   Loletha Grayer, MD  senna-docusate (SENOKOT-S) 8.6-50 MG tablet Take 1 tablet by mouth at bedtime as needed for mild constipation. 09/12/15   Loletha Grayer, MD  simvastatin (ZOCOR) 20 MG tablet Take 20  mg by mouth at bedtime.  07/11/15   Historical Provider, MD  tamsulosin (FLOMAX) 0.4 MG CAPS capsule Take 0.4 mg by mouth 2 (two) times daily. 08/08/15   Historical Provider, MD     Allergies Patient has no known allergies.  Family History  Problem Relation Age of Onset  . CVA Father 18    Social History Social History  Substance Use Topics  . Smoking status: Never Smoker  . Smokeless tobacco: Never Used  . Alcohol use No    Review of Systems  Constitutional: No fever/chills  Cardiovascular: Denies chest pain. Respiratory: Denies shortness of  breath. Gastrointestinal: No abdominal pain.     Musculoskeletal: Negative for back pain. Skin: Negative for Abrasion or laceration Neurological: Negative for headaches   10-point ROS otherwise negative.  ____________________________________________   PHYSICAL EXAM:  VITAL SIGNS: ED Triage Vitals  Enc Vitals Group     BP 06/12/16 0904 138/75     Pulse Rate 06/12/16 0904 89     Resp 06/12/16 0904 18     Temp 06/12/16 0904 98.1 F (36.7 C)     Temp Source 06/12/16 0904 Oral     SpO2 06/12/16 0904 96 %     Weight 06/12/16 0904 150 lb (68 kg)     Height 06/12/16 0904 5\' 9"  (1.753 m)     Head Circumference --      Peak Flow --      Pain Score 06/12/16 0905 2     Pain Loc --      Pain Edu? --      Excl. in [place]? --     Constitutional: Alert and oriented. No acute distress. Pleasant and interactive Eyes: Conjunctivae are normal.  Head: Atraumatic.  Mouth/Throat: Mucous membranes are moist.   Neck:  Painless ROM Cardiovascular: Normal rate, regular rhythm.   Good peripheral circulation. Respiratory: Normal respiratory effort.  No retractions. Gastrointestinal: Soft and nontender. No distention.  No CVA tenderness. Genitourinary: deferred Musculoskeletal: Mild tenderness to palpation along the left lateral hip, no bony abnormalities felt, no pain with axial load, full range of motion Neurologic:  Normal speech and language. No gross focal neurologic deficits are appreciated.  Skin:  Skin is warm, dry and intact. No rash noted. Psychiatric: Mood and affect are normal. Speech and behavior are normal.  ____________________________________________   LABS (all labs ordered are listed, but only abnormal results are displayed)  Labs Reviewed - No data to display ____________________________________________  EKG  None ____________________________________________  RADIOLOGY  Nondisplaced fractures of the left inferior and superior pelvic  ramus ____________________________________________   PROCEDURES  Procedure(s) performed: No    Critical Care performed: No ____________________________________________   INITIAL IMPRESSION / ASSESSMENT AND PLAN / ED COURSE  Pertinent labs & imaging results that were available during my care of the patient were reviewed by me and considered in my medical decision making (see chart for details).  Patient presents after a fall on left hip. Unable to bear significant weight. Suspect fracture, history of similar one year ago on the opposite hip.    Discussed with Dr. Rudene Christians, agrees with weightbearing as tolerated for pelvic fractures.  Patient is at [facility], would like to avoid admission if possible. We will have PT and SW consult.  Physical therapy saw the patient and social worker has arranged rehabilitation at [facility], family has opted to take the patient  ___________________________________________   FINAL CLINICAL IMPRESSION(S) / ED DIAGNOSES  Final diagnoses:  Closed fracture of other  parts of pelvis, initial encounter ([place])      NEW MEDICATIONS STARTED DURING THIS VISIT:  New Prescriptions   No medications on file     Note:  This document was prepared using Dragon voice recognition software and may include unintentional dictation errors.    Lavonia Drafts, MD 06/12/16 [phone]

## 2016-06-12 NOTE — Clinical Social Work Note (Signed)
Clinical Social Work Assessment  Patient Details  Name: Brian Barry MRN: [id number] Date of Birth: Jun 17, 1929  Date of referral:  06/12/16               Reason for consult:  Facility Placement                Permission sought to share information with:  Facility Sport and exercise psychologist, Family Supports Permission granted to share information::     Name::     [name] (daughter) [phone]; [name] (wife) [phone]  Agency::     Relationship::     Contact Information:     Housing/Transportation Living arrangements for the past 2 months:  [place] of Information:  Patient, Adult Children, Spouse Patient Interpreter Needed:  None Criminal Activity/Legal Involvement Pertinent to Current Situation/Hospitalization:  No - Comment as needed Significant Relationships:  Adult Children, Spouse Lives with:  Spouse Do you feel safe going back to the place where you live?  Yes Need for family participation in patient care:  Yes (Comment)  Care giving concerns: Pt is currently unable to care for himself independently.    Social Worker assessment / plan:  CSW received consult for facility placement. Pt is 83 yo white male who presents to the ED with a pelvic fracture. Pt is currently a resident at [facility] independent living, where he resides with his wife. CSW engaged with pt at pt's bedside. Pt's wife and daughter were also present at the time of the assessment. CSW explained that pt has receievd a bed offer at [facility], [facility], and [facility]. Pt and pt's family state that they would like for pt to go to [facility] for SNF level care. CSW spoke with [name] from [facility] and pt can be transported to the facility today. After PT consult is complete CSW will fax note to [facility] and assist with d/c.  Employment status:  Retired Nurse, adult PT Recommendations:   (Awaiting PT consult) Information / Referral  to community resources:  [place]  Patient/Family's Response to care: Pt will d/c to [facility]  Patient/Family's Understanding of and Emotional Response to Diagnosis, Current Treatment, and Prognosis:  Pt and pt's family are appreciative of the care provided by CSW at this time.  Emotional Assessment Appearance:  Appears stated age Attitude/Demeanor/Rapport:  Lethargic Affect (typically observed):  Accepting, Pleasant Orientation:  Oriented to Self, Oriented to Place Alcohol / Substance use:  Not Applicable Psych involvement (Current and /or in the community):  No (Comment)  Discharge Needs  Concerns to be addressed:  Discharge Planning Concerns, Care Coordination Readmission within the last 30 days:  No Current discharge risk:  Dependent with Mobility Barriers to Discharge:  Continued Medical Work up   [employer], [place] 06/12/2016, 2:16 PM

## 2016-06-14 DIAGNOSIS — I10 Essential (primary) hypertension: Secondary | ICD-10-CM | POA: Diagnosis not present

## 2016-06-14 DIAGNOSIS — E119 Type 2 diabetes mellitus without complications: Secondary | ICD-10-CM | POA: Diagnosis not present

## 2016-06-14 DIAGNOSIS — S32511A Fracture of superior rim of right pubis, initial encounter for closed fracture: Secondary | ICD-10-CM | POA: Diagnosis not present

## 2016-06-14 DIAGNOSIS — N400 Enlarged prostate without lower urinary tract symptoms: Secondary | ICD-10-CM | POA: Diagnosis not present

## 2016-07-04 DIAGNOSIS — J181 Lobar pneumonia, unspecified organism: Secondary | ICD-10-CM | POA: Diagnosis not present

## 2016-07-04 DIAGNOSIS — E039 Hypothyroidism, unspecified: Secondary | ICD-10-CM | POA: Diagnosis not present

## 2016-07-04 DIAGNOSIS — G9341 Metabolic encephalopathy: Secondary | ICD-10-CM | POA: Diagnosis not present

## 2016-07-04 DIAGNOSIS — E785 Hyperlipidemia, unspecified: Secondary | ICD-10-CM | POA: Diagnosis not present

## 2016-07-04 DIAGNOSIS — I10 Essential (primary) hypertension: Secondary | ICD-10-CM | POA: Diagnosis not present

## 2016-07-04 DIAGNOSIS — Z8673 Personal history of transient ischemic attack (TIA), and cerebral infarction without residual deficits: Secondary | ICD-10-CM | POA: Diagnosis not present

## 2016-07-04 DIAGNOSIS — Z9181 History of falling: Secondary | ICD-10-CM | POA: Diagnosis not present

## 2016-07-04 DIAGNOSIS — Z96643 Presence of artificial hip joint, bilateral: Secondary | ICD-10-CM | POA: Diagnosis not present

## 2016-07-04 DIAGNOSIS — E119 Type 2 diabetes mellitus without complications: Secondary | ICD-10-CM | POA: Diagnosis not present

## 2016-07-05 DIAGNOSIS — S32592D Other specified fracture of left pubis, subsequent encounter for fracture with routine healing: Secondary | ICD-10-CM | POA: Diagnosis not present

## 2016-07-05 DIAGNOSIS — F5101 Primary insomnia: Secondary | ICD-10-CM | POA: Diagnosis not present

## 2016-07-05 DIAGNOSIS — M8000XD Age-related osteoporosis with current pathological fracture, unspecified site, subsequent encounter for fracture with routine healing: Secondary | ICD-10-CM | POA: Diagnosis not present

## 2016-07-05 DIAGNOSIS — I10 Essential (primary) hypertension: Secondary | ICD-10-CM | POA: Diagnosis not present

## 2016-07-05 DIAGNOSIS — E119 Type 2 diabetes mellitus without complications: Secondary | ICD-10-CM | POA: Diagnosis not present

## 2016-07-06 DIAGNOSIS — E785 Hyperlipidemia, unspecified: Secondary | ICD-10-CM | POA: Diagnosis not present

## 2016-07-06 DIAGNOSIS — E039 Hypothyroidism, unspecified: Secondary | ICD-10-CM | POA: Diagnosis not present

## 2016-07-06 DIAGNOSIS — G9341 Metabolic encephalopathy: Secondary | ICD-10-CM | POA: Diagnosis not present

## 2016-07-06 DIAGNOSIS — I10 Essential (primary) hypertension: Secondary | ICD-10-CM | POA: Diagnosis not present

## 2016-07-06 DIAGNOSIS — Z9181 History of falling: Secondary | ICD-10-CM | POA: Diagnosis not present

## 2016-07-06 DIAGNOSIS — Z8673 Personal history of transient ischemic attack (TIA), and cerebral infarction without residual deficits: Secondary | ICD-10-CM | POA: Diagnosis not present

## 2016-07-06 DIAGNOSIS — J181 Lobar pneumonia, unspecified organism: Secondary | ICD-10-CM | POA: Diagnosis not present

## 2016-07-06 DIAGNOSIS — Z96643 Presence of artificial hip joint, bilateral: Secondary | ICD-10-CM | POA: Diagnosis not present

## 2016-07-06 DIAGNOSIS — E119 Type 2 diabetes mellitus without complications: Secondary | ICD-10-CM | POA: Diagnosis not present

## 2016-07-09 DIAGNOSIS — J181 Lobar pneumonia, unspecified organism: Secondary | ICD-10-CM | POA: Diagnosis not present

## 2016-07-09 DIAGNOSIS — M810 Age-related osteoporosis without current pathological fracture: Secondary | ICD-10-CM | POA: Diagnosis not present

## 2016-07-09 DIAGNOSIS — E1165 Type 2 diabetes mellitus with hyperglycemia: Secondary | ICD-10-CM | POA: Diagnosis not present

## 2016-07-09 DIAGNOSIS — G9341 Metabolic encephalopathy: Secondary | ICD-10-CM | POA: Diagnosis not present

## 2016-07-09 DIAGNOSIS — E785 Hyperlipidemia, unspecified: Secondary | ICD-10-CM | POA: Diagnosis not present

## 2016-07-09 DIAGNOSIS — E559 Vitamin D deficiency, unspecified: Secondary | ICD-10-CM | POA: Diagnosis not present

## 2016-07-09 DIAGNOSIS — Z8673 Personal history of transient ischemic attack (TIA), and cerebral infarction without residual deficits: Secondary | ICD-10-CM | POA: Diagnosis not present

## 2016-07-09 DIAGNOSIS — E119 Type 2 diabetes mellitus without complications: Secondary | ICD-10-CM | POA: Diagnosis not present

## 2016-07-09 DIAGNOSIS — Z96643 Presence of artificial hip joint, bilateral: Secondary | ICD-10-CM | POA: Diagnosis not present

## 2016-07-09 DIAGNOSIS — Z8781 Personal history of (healed) traumatic fracture: Secondary | ICD-10-CM | POA: Diagnosis not present

## 2016-07-09 DIAGNOSIS — Z794 Long term (current) use of insulin: Secondary | ICD-10-CM | POA: Diagnosis not present

## 2016-07-09 DIAGNOSIS — I10 Essential (primary) hypertension: Secondary | ICD-10-CM | POA: Diagnosis not present

## 2016-07-09 DIAGNOSIS — Z9181 History of falling: Secondary | ICD-10-CM | POA: Diagnosis not present

## 2016-07-09 DIAGNOSIS — E039 Hypothyroidism, unspecified: Secondary | ICD-10-CM | POA: Diagnosis not present

## 2016-07-10 DIAGNOSIS — E785 Hyperlipidemia, unspecified: Secondary | ICD-10-CM | POA: Diagnosis not present

## 2016-07-10 DIAGNOSIS — Z96643 Presence of artificial hip joint, bilateral: Secondary | ICD-10-CM | POA: Diagnosis not present

## 2016-07-10 DIAGNOSIS — G9341 Metabolic encephalopathy: Secondary | ICD-10-CM | POA: Diagnosis not present

## 2016-07-10 DIAGNOSIS — E039 Hypothyroidism, unspecified: Secondary | ICD-10-CM | POA: Diagnosis not present

## 2016-07-10 DIAGNOSIS — Z9181 History of falling: Secondary | ICD-10-CM | POA: Diagnosis not present

## 2016-07-10 DIAGNOSIS — J181 Lobar pneumonia, unspecified organism: Secondary | ICD-10-CM | POA: Diagnosis not present

## 2016-07-10 DIAGNOSIS — Z8673 Personal history of transient ischemic attack (TIA), and cerebral infarction without residual deficits: Secondary | ICD-10-CM | POA: Diagnosis not present

## 2016-07-10 DIAGNOSIS — I10 Essential (primary) hypertension: Secondary | ICD-10-CM | POA: Diagnosis not present

## 2016-07-10 DIAGNOSIS — E119 Type 2 diabetes mellitus without complications: Secondary | ICD-10-CM | POA: Diagnosis not present

## 2016-07-12 ENCOUNTER — Encounter: Payer: Self-pay | Admitting: Urology

## 2016-07-12 ENCOUNTER — Ambulatory Visit (INDEPENDENT_AMBULATORY_CARE_PROVIDER_SITE_OTHER): Payer: Medicare HMO | Admitting: Urology

## 2016-07-12 ENCOUNTER — Telehealth: Payer: Self-pay | Admitting: Urology

## 2016-07-12 VITALS — BP 138/68 | HR 78 | Ht 69.0 in | Wt 165.2 lb

## 2016-07-12 DIAGNOSIS — J181 Lobar pneumonia, unspecified organism: Secondary | ICD-10-CM | POA: Diagnosis not present

## 2016-07-12 DIAGNOSIS — N401 Enlarged prostate with lower urinary tract symptoms: Secondary | ICD-10-CM

## 2016-07-12 DIAGNOSIS — Z96643 Presence of artificial hip joint, bilateral: Secondary | ICD-10-CM | POA: Diagnosis not present

## 2016-07-12 DIAGNOSIS — Z8673 Personal history of transient ischemic attack (TIA), and cerebral infarction without residual deficits: Secondary | ICD-10-CM | POA: Diagnosis not present

## 2016-07-12 DIAGNOSIS — R350 Frequency of micturition: Secondary | ICD-10-CM | POA: Diagnosis not present

## 2016-07-12 DIAGNOSIS — G9341 Metabolic encephalopathy: Secondary | ICD-10-CM | POA: Diagnosis not present

## 2016-07-12 DIAGNOSIS — E785 Hyperlipidemia, unspecified: Secondary | ICD-10-CM | POA: Diagnosis not present

## 2016-07-12 DIAGNOSIS — E119 Type 2 diabetes mellitus without complications: Secondary | ICD-10-CM | POA: Diagnosis not present

## 2016-07-12 DIAGNOSIS — I10 Essential (primary) hypertension: Secondary | ICD-10-CM | POA: Diagnosis not present

## 2016-07-12 DIAGNOSIS — E039 Hypothyroidism, unspecified: Secondary | ICD-10-CM | POA: Diagnosis not present

## 2016-07-12 DIAGNOSIS — N138 Other obstructive and reflux uropathy: Secondary | ICD-10-CM | POA: Diagnosis not present

## 2016-07-12 DIAGNOSIS — Z9181 History of falling: Secondary | ICD-10-CM | POA: Diagnosis not present

## 2016-07-12 DIAGNOSIS — R351 Nocturia: Secondary | ICD-10-CM

## 2016-07-12 LAB — URINALYSIS, COMPLETE
BILIRUBIN UA: NEGATIVE
GLUCOSE, UA: NEGATIVE
KETONES UA: NEGATIVE
Leukocytes, UA: NEGATIVE
NITRITE UA: NEGATIVE
PROTEIN UA: NEGATIVE
RBC, UA: NEGATIVE
SPEC GRAV UA: 1.01 (ref 1.005–1.030)
Urobilinogen, Ur: 0.2 mg/dL (ref 0.2–1.0)
pH, UA: 5 (ref 5.0–7.5)

## 2016-07-12 LAB — MICROSCOPIC EXAMINATION
Bacteria, UA: NONE SEEN
Epithelial Cells (non renal): NONE SEEN /hpf (ref 0–10)
WBC UA: NONE SEEN /HPF (ref 0–?)

## 2016-07-12 LAB — BLADDER SCAN AMB NON-IMAGING: SCAN RESULT: 153

## 2016-07-12 NOTE — Telephone Encounter (Signed)
Please fax my note to Dr. Elzie Rings office.

## 2016-07-12 NOTE — Telephone Encounter (Signed)
DONE

## 2016-07-12 NOTE — Progress Notes (Signed)
07/12/2016 3:36 PM   Kathie Rhodes [birth year] [id number]  Referring provider: Derinda Late, MD [phone] [facility] [place], [place] [address]  Chief Complaint  Patient presents with  . New Patient (Initial Visit)    BPH and Urinary Frequency referred by Dr. Loney Hering    HPI: Patient is an 83 year old Caucasian male who is referred for frequency and BPH with LU TS by Dr. Loney Hering with his wife, [name].    His IPSS score today is 7, which is mild lower urinary tract symptomatology. He is unhappy with his quality life due to his urinary symptoms.  His PVR is 153 mL.    His major complaints today are urinary frequency at night, urgency, nocturia every two hours and urge incontinence infrequently .   His most bothersome symptom is nocturia.  Wife has also witness apneic events and states he is extremely restless at night.  He has had these symptoms for the last month.  He denies any dysuria, hematuria or suprapubic pain.   He currently taking tamsulosin 0.4 mg daily.      He also denies any recent fevers, chills, nausea or vomiting.      IPSS    Row Name 07/12/16 1300         International Prostate Symptom Score   How often have you had the sensation of not emptying your bladder? Less than 1 in 5     How often have you had to urinate less than every two hours? Not at All     How often have you found you stopped and started again several times when you urinated? Not at All     How often have you found it difficult to postpone urination? Not at All     How often have you had a weak urinary stream? Less than half the time     How often have you had to strain to start urination? Not at All     How many times did you typically get up at night to urinate? 4 Times     Total IPSS Score 7       Quality of Life due to urinary symptoms   If you were to spend the rest of your life with your urinary condition just the way it is now how would  you feel about that? Unhappy        Score:  1-7 Mild 8-19 Moderate 20-35 Severe    PMH: Past Medical History:  Diagnosis Date  . Arthritis   . BPH (benign prostatic hyperplasia)   . DM (diabetes mellitus) ([place])   . Hyperlipidemia   . Hypertension   . Hypothyroidism   . Osteoporosis   . Pelvic fracture ([place])   . TIA (transient ischemic attack) 1998, 2014    Surgical History: Past Surgical History:  Procedure Laterality Date  . bilateral hip replacement Bilateral 1998  . Left knee replacement Left 2007  . LUMBAR LAMINECTOMY  2010  . right hip revision Right 2008    Home Medications:  Allergies as of 07/12/2016   No Known Allergies     Medication List       Accurate as of 07/12/16  3:36 PM. Always use your most recent med list.          ACCU-CHEK SOFTCLIX LANCETS lancets Use as instructed twice daily   albuterol 108 (90 Base) MCG/ACT inhaler Commonly known as:  PROVENTIL HFA;VENTOLIN HFA  Inhale into the lungs.   calcium-vitamin D 500-200 MG-UNIT tablet Commonly known as:  OSCAL WITH D Take 1 tablet by mouth daily.   clopidogrel 75 MG tablet Commonly known as:  PLAVIX Take 75 mg by mouth daily.   dextromethorphan 30 MG/5ML liquid Commonly known as:  DELSYM Take 5 mLs (30 mg total) by mouth 2 (two) times daily.   feeding supplement (GLUCERNA SHAKE) Liqd Take 237 mLs by mouth 2 (two) times daily between meals.   gabapentin 300 MG capsule Commonly known as:  NEURONTIN Take by mouth.   HYDROcodone-acetaminophen 5-325 MG tablet Commonly known as:  NORCO/VICODIN Take 1 tablet by mouth every 4 (four) hours as needed for moderate pain.   hydrocortisone 25 MG suppository Commonly known as:  ANUSOL-HC Place rectally.   insulin aspart 100 UNIT/ML FlexPen Commonly known as:  NOVOLOG Inject 5 Units into the skin 3 (three) times daily with meals.   LANTUS SOLOSTAR 100 UNIT/ML Solostar Pen Generic drug:  Insulin Glargine Inject 18 Units into the skin  every morning.   levothyroxine 25 MCG tablet Commonly known as:  SYNTHROID, LEVOTHROID Take 1 tablet by mouth daily.   lisinopril 20 MG tablet Commonly known as:  PRINIVIL,ZESTRIL Take 10 mg by mouth daily.   meclizine 25 MG tablet Commonly known as:  ANTIVERT Take 1 tablet (25 mg total) by mouth 3 (three) times daily as needed.   metFORMIN 500 MG tablet Commonly known as:  GLUCOPHAGE Take 500 mg by mouth 2 (two) times daily.   pioglitazone 15 MG tablet Commonly known as:  ACTOS Take by mouth.   polyethylene glycol packet Commonly known as:  MIRALAX / GLYCOLAX Take 17 g by mouth daily.   senna-docusate 8.6-50 MG tablet Commonly known as:  Senokot-S Take 1 tablet by mouth at bedtime as needed for mild constipation.   simvastatin 20 MG tablet Commonly known as:  ZOCOR Take 20 mg by mouth at bedtime.   tamsulosin 0.4 MG Caps capsule Commonly known as:  FLOMAX Take 0.4 mg by mouth 2 (two) times daily.   Teriparatide (Recombinant) 600 MCG/2.4ML Soln Inject into the skin.       Allergies: No Known Allergies  Family History: Family History  Problem Relation Age of Onset  . CVA Father 75  . Prostate cancer Neg Hx   . Kidney cancer Neg Hx   . Bladder Cancer Neg Hx     Social History:  reports that he has never smoked. He has never used smokeless tobacco. He reports that he does not drink alcohol or use drugs.  ROS: UROLOGY Frequent Urination?: Yes Hard to postpone urination?: Yes Burning/pain with urination?: No Get up at night to urinate?: Yes Leakage of urine?: Yes Urine stream starts and stops?: No Trouble starting stream?: No Do you have to strain to urinate?: No Blood in urine?: No Urinary tract infection?: No Sexually transmitted disease?: No Injury to kidneys or bladder?: No Painful intercourse?: No Weak stream?: No Erection problems?: Yes Penile pain?: No  Gastrointestinal Nausea?: No Vomiting?: No Indigestion/heartburn?: Yes Diarrhea?:  No Constipation?: No  Constitutional Fever: No Night sweats?: No Weight loss?: No Fatigue?: No  Skin Skin rash/lesions?: No Itching?: No  Eyes Blurred vision?: No Double vision?: No  Ears/Nose/Throat Sore throat?: No Sinus problems?: No  Hematologic/Lymphatic Swollen glands?: No Easy bruising?: Yes  Cardiovascular Leg swelling?: No Chest pain?: No  Respiratory Cough?: No Shortness of breath?: No  Endocrine Excessive thirst?: No  Musculoskeletal Back pain?: No Joint pain?: No  Neurological Headaches?: No Dizziness?: Yes  Psychologic Depression?: Yes Anxiety?: Yes  Physical Exam: BP 138/68   Pulse 78   Ht 5\' 9"  (1.753 m)   Wt 165 lb 3.2 oz (74.9 kg)   BMI 24.40 kg/m   Constitutional: Well nourished. Alert and oriented, No acute distress. HEENT:  AT, moist mucus membranes. Trachea midline, no masses. Cardiovascular: No clubbing, cyanosis, or edema. Respiratory: Normal respiratory effort, no increased work of breathing. GI: Abdomen is soft, non tender, non distended, no abdominal masses. Liver and spleen not palpable.  No hernias appreciated.  Stool sample for occult testing is not indicated.   GU: No CVA tenderness.  No bladder fullness or masses.  Patient with circumcised phallus.   Urethral meatus is patent.  No penile discharge. No penile lesions or rashes. Scrotum without lesions, cysts, rashes and/or edema.  Testicles are located scrotally bilaterally. No masses are appreciated in the testicles. Left and right epididymis are normal. Rectal: Patient with  normal sphincter tone. Anus and perineum without scarring or rashes. No rectal masses are appreciated. Prostate is approximately 45 grams, irregular, no nodules are appreciated. Seminal vesicles are normal. Skin: No rashes, bruises or suspicious lesions. Lymph: No cervical or inguinal adenopathy. Neurologic: Grossly intact, no focal deficits, moving all 4 extremities. Psychiatric: Normal mood and  affect.  Laboratory Data: Lab Results  Component Value Date   WBC 7.6 05/30/2016   HGB 12.0 (L) 05/30/2016   HCT 34.3 (L) 05/30/2016   MCV 92.7 05/30/2016   PLT 261 05/30/2016    Lab Results  Component Value Date   CREATININE 0.57 (L) 05/30/2016    Lab Results  Component Value Date   AST 25 05/30/2016   Lab Results  Component Value Date   ALT 13 (L) 05/30/2016    Pertinent Imaging: Results for SHAMOND, CURY (MRN [id number]) as of 07/12/2016 14:15  Ref. Range 07/12/2016 13:57  Scan Result Unknown 153    Assessment & Plan:   1. Nocturia  - I explained to the patient that nocturia is often multi-factorial and difficult to treat.  Sleeping disorders, heart conditions, peripheral vascular disease, diabetes, an enlarged prostate for men, an urethral stricture causing bladder outlet obstruction and/or certain medications can contribute to nocturia.  - I have suggested that the patient avoid caffeine after noon and alcohol in the evening.  He or she may also benefit from fluid restrictions after 6:00 in the evening and voiding just prior to bedtime.  - I have explained that research studies have showed that over 84% of patients with sleep apnea reported frequent nighttime urination.   With sleep apnea, oxygen decreases, carbon dioxide increases, the blood become more acidic, the heart rate drops and blood vessels in the lung constrict.  The body is then alerted that something is very wrong. The sleeper must wake enough to reopen the airway. By this time, the heart is racing and experiences a false signal of fluid overload. The heart excretes a hormone-like protein that tells the body to get rid of sodium and water, resulting in nocturia.  -  I also informed the patient that a recent study noted that decreasing sodium intake to 2.3 grams daily, if they don't have issues with hyponatremia, can also reduce the number of nightly voids  - The patient may benefit from a discussion with his or  her primary care physician to see if he or she has risk factors for sleep apnea or other sleep disturbances and obtaining a sleep study.  -  BLADDER SCAN AMB NON-IMAGING  2. BPH with LUTS  - IPSS score is 7/5  - Continue conservative management, avoiding bladder irritants and timed voiding's  - Continue tamsulosin 0.4 mg daily  - RTC pending sleep study  Return for pending sleep study results.  These notes generated with voice recognition software. I apologize for typographical errors.  Zara Council, [facility] [address] [place], [place] [address] [phone]

## 2016-07-13 DIAGNOSIS — E039 Hypothyroidism, unspecified: Secondary | ICD-10-CM | POA: Diagnosis not present

## 2016-07-13 DIAGNOSIS — Z96643 Presence of artificial hip joint, bilateral: Secondary | ICD-10-CM | POA: Diagnosis not present

## 2016-07-13 DIAGNOSIS — G9341 Metabolic encephalopathy: Secondary | ICD-10-CM | POA: Diagnosis not present

## 2016-07-13 DIAGNOSIS — Z9181 History of falling: Secondary | ICD-10-CM | POA: Diagnosis not present

## 2016-07-13 DIAGNOSIS — E119 Type 2 diabetes mellitus without complications: Secondary | ICD-10-CM | POA: Diagnosis not present

## 2016-07-13 DIAGNOSIS — I10 Essential (primary) hypertension: Secondary | ICD-10-CM | POA: Diagnosis not present

## 2016-07-13 DIAGNOSIS — J181 Lobar pneumonia, unspecified organism: Secondary | ICD-10-CM | POA: Diagnosis not present

## 2016-07-13 DIAGNOSIS — E785 Hyperlipidemia, unspecified: Secondary | ICD-10-CM | POA: Diagnosis not present

## 2016-07-13 DIAGNOSIS — Z8673 Personal history of transient ischemic attack (TIA), and cerebral infarction without residual deficits: Secondary | ICD-10-CM | POA: Diagnosis not present

## 2016-07-16 ENCOUNTER — Observation Stay: Payer: Medicare HMO

## 2016-07-16 ENCOUNTER — Emergency Department: Payer: Medicare HMO

## 2016-07-16 ENCOUNTER — Observation Stay
Admission: EM | Admit: 2016-07-16 | Discharge: 2016-07-16 | Disposition: A | Discharge status: Home / Self Care | Payer: Medicare HMO | Source: Home / Self Care | Attending: Internal Medicine | Admitting: Internal Medicine

## 2016-07-16 ENCOUNTER — Encounter: Payer: Self-pay | Admitting: Emergency Medicine

## 2016-07-16 ENCOUNTER — Observation Stay
Admission: EM | Admit: 2016-07-16 | Discharge: 2016-07-17 | Disposition: A | Discharge status: Home / Self Care | Payer: Medicare HMO | Attending: Internal Medicine | Admitting: Internal Medicine

## 2016-07-16 DIAGNOSIS — Z7982 Long term (current) use of aspirin: Secondary | ICD-10-CM | POA: Insufficient documentation

## 2016-07-16 DIAGNOSIS — E119 Type 2 diabetes mellitus without complications: Secondary | ICD-10-CM | POA: Insufficient documentation

## 2016-07-16 DIAGNOSIS — M1990 Unspecified osteoarthritis, unspecified site: Secondary | ICD-10-CM | POA: Diagnosis not present

## 2016-07-16 DIAGNOSIS — R479 Unspecified speech disturbances: Secondary | ICD-10-CM | POA: Diagnosis not present

## 2016-07-16 DIAGNOSIS — G4700 Insomnia, unspecified: Secondary | ICD-10-CM | POA: Diagnosis not present

## 2016-07-16 DIAGNOSIS — R410 Disorientation, unspecified: Secondary | ICD-10-CM

## 2016-07-16 DIAGNOSIS — Z7902 Long term (current) use of antithrombotics/antiplatelets: Secondary | ICD-10-CM | POA: Insufficient documentation

## 2016-07-16 DIAGNOSIS — Z79899 Other long term (current) drug therapy: Secondary | ICD-10-CM | POA: Insufficient documentation

## 2016-07-16 DIAGNOSIS — Z823 Family history of stroke: Secondary | ICD-10-CM | POA: Insufficient documentation

## 2016-07-16 DIAGNOSIS — I10 Essential (primary) hypertension: Secondary | ICD-10-CM | POA: Diagnosis not present

## 2016-07-16 DIAGNOSIS — E871 Hypo-osmolality and hyponatremia: Secondary | ICD-10-CM | POA: Insufficient documentation

## 2016-07-16 DIAGNOSIS — G458 Other transient cerebral ischemic attacks and related syndromes: Secondary | ICD-10-CM | POA: Diagnosis not present

## 2016-07-16 DIAGNOSIS — E785 Hyperlipidemia, unspecified: Secondary | ICD-10-CM | POA: Diagnosis not present

## 2016-07-16 DIAGNOSIS — F05 Delirium due to known physiological condition: Secondary | ICD-10-CM | POA: Diagnosis not present

## 2016-07-16 DIAGNOSIS — R531 Weakness: Secondary | ICD-10-CM | POA: Diagnosis not present

## 2016-07-16 DIAGNOSIS — G459 Transient cerebral ischemic attack, unspecified: Principal | ICD-10-CM | POA: Insufficient documentation

## 2016-07-16 DIAGNOSIS — M810 Age-related osteoporosis without current pathological fracture: Secondary | ICD-10-CM | POA: Insufficient documentation

## 2016-07-16 DIAGNOSIS — Z794 Long term (current) use of insulin: Secondary | ICD-10-CM | POA: Insufficient documentation

## 2016-07-16 DIAGNOSIS — E039 Hypothyroidism, unspecified: Secondary | ICD-10-CM | POA: Diagnosis not present

## 2016-07-16 DIAGNOSIS — Z8673 Personal history of transient ischemic attack (TIA), and cerebral infarction without residual deficits: Secondary | ICD-10-CM | POA: Diagnosis not present

## 2016-07-16 DIAGNOSIS — Z96643 Presence of artificial hip joint, bilateral: Secondary | ICD-10-CM | POA: Insufficient documentation

## 2016-07-16 DIAGNOSIS — N400 Enlarged prostate without lower urinary tract symptoms: Secondary | ICD-10-CM | POA: Diagnosis not present

## 2016-07-16 DIAGNOSIS — R4182 Altered mental status, unspecified: Secondary | ICD-10-CM | POA: Diagnosis not present

## 2016-07-16 LAB — URINALYSIS, COMPLETE (UACMP) WITH MICROSCOPIC
BILIRUBIN URINE: NEGATIVE
Bacteria, UA: NONE SEEN
GLUCOSE, UA: NEGATIVE mg/dL
Hgb urine dipstick: NEGATIVE
KETONES UR: NEGATIVE mg/dL
Leukocytes, UA: NEGATIVE
NITRITE: NEGATIVE
PH: 8 (ref 5.0–8.0)
Protein, ur: NEGATIVE mg/dL
Specific Gravity, Urine: 1.008 (ref 1.005–1.030)
Squamous Epithelial / LPF: NONE SEEN

## 2016-07-16 LAB — COMPREHENSIVE METABOLIC PANEL
ALT: 12 U/L — AB (ref 17–63)
AST: 22 U/L (ref 15–41)
Albumin: 3.9 g/dL (ref 3.5–5.0)
Alkaline Phosphatase: 106 U/L (ref 38–126)
Anion gap: 6 (ref 5–15)
BILIRUBIN TOTAL: 0.3 mg/dL (ref 0.3–1.2)
BUN: 12 mg/dL (ref 6–20)
CALCIUM: 8.9 mg/dL (ref 8.9–10.3)
CHLORIDE: 94 mmol/L — AB (ref 101–111)
CO2: 31 mmol/L (ref 22–32)
CREATININE: 0.69 mg/dL (ref 0.61–1.24)
Glucose, Bld: 115 mg/dL — ABNORMAL HIGH (ref 65–99)
Potassium: 3.8 mmol/L (ref 3.5–5.1)
Sodium: 131 mmol/L — ABNORMAL LOW (ref 135–145)
TOTAL PROTEIN: 6.4 g/dL — AB (ref 6.5–8.1)

## 2016-07-16 LAB — GLUCOSE, CAPILLARY
GLUCOSE-CAPILLARY: 130 mg/dL — AB (ref 65–99)
GLUCOSE-CAPILLARY: 119 mg/dL — AB (ref 65–99)
Glucose-Capillary: 110 mg/dL — ABNORMAL HIGH (ref 65–99)
Glucose-Capillary: 102 mg/dL — ABNORMAL HIGH (ref 65–99)
Glucose-Capillary: 200 mg/dL — ABNORMAL HIGH (ref 65–99)

## 2016-07-16 LAB — LIPID PANEL
Cholesterol: 147 mg/dL (ref 0–200)
HDL: 44 mg/dL (ref >40)
LDL CALC: 89 mg/dL (ref 0–99)
Total CHOL/HDL Ratio: 3.3 RATIO
Triglycerides: 70 mg/dL (ref <150)
VLDL: 14 mg/dL (ref 0–40)

## 2016-07-16 LAB — TROPONIN I

## 2016-07-16 LAB — CBC
HCT: 33.8 % — ABNORMAL LOW (ref 40.0–52.0)
Hemoglobin: 11.7 g/dL — ABNORMAL LOW (ref 13.0–18.0)
MCH: 32.6 pg (ref 26.0–34.0)
MCHC: 34.6 g/dL (ref 32.0–36.0)
MCV: 94.3 fL (ref 80.0–100.0)
Platelets: 216 10*3/uL (ref 150–440)
RBC: 3.59 MIL/uL — AB (ref 4.40–5.90)
RDW: 13.8 % (ref 11.5–14.5)
WBC: 4.4 10*3/uL (ref 3.8–10.6)

## 2016-07-16 LAB — ECHOCARDIOGRAM COMPLETE
HEIGHTINCHES: 69 in
WEIGHTICAEL: 2416 [oz_av]

## 2016-07-16 MED ORDER — ASPIRIN 300 MG RE SUPP
300.0000 mg | Freq: Once | RECTAL | Status: AC
  Administered 2016-07-16: 300 mg via RECTAL
  Filled 2016-07-16: qty 1

## 2016-07-16 MED ORDER — INSULIN ASPART 100 UNIT/ML ~~LOC~~ SOLN
0.0000 [IU] | Freq: Three times a day (TID) | SUBCUTANEOUS | Status: DC
  Administered 2016-07-16: 2 [IU] via SUBCUTANEOUS
  Administered 2016-07-16: 3 [IU] via SUBCUTANEOUS
  Administered 2016-07-17: 8 [IU] via SUBCUTANEOUS
  Filled 2016-07-16: qty 8
  Filled 2016-07-16: qty 3

## 2016-07-16 MED ORDER — SENNOSIDES-DOCUSATE SODIUM 8.6-50 MG PO TABS: 1.0000 | ORAL_TABLET | Freq: Every evening | ORAL | Status: DC | PRN

## 2016-07-16 MED ORDER — ENOXAPARIN SODIUM 40 MG/0.4ML ~~LOC~~ SOLN
40.0000 mg | SUBCUTANEOUS | Status: DC
  Administered 2016-07-16 – 2016-07-17 (×2): 40 mg via SUBCUTANEOUS
  Filled 2016-07-16 (×2): qty 0.4

## 2016-07-16 MED ORDER — ONDANSETRON HCL 4 MG/2ML IJ SOLN
INTRAMUSCULAR | Status: AC
  Administered 2016-07-16: 4 mg via INTRAVENOUS
  Filled 2016-07-16: qty 2

## 2016-07-16 MED ORDER — STROKE: EARLY STAGES OF RECOVERY BOOK
Freq: Once | Status: AC
  Administered 2016-07-16: 10:00:00

## 2016-07-16 MED ORDER — ALBUTEROL SULFATE HFA 108 (90 BASE) MCG/ACT IN AERS: 1.0000 | INHALATION_SPRAY | RESPIRATORY_TRACT | Status: DC | PRN

## 2016-07-16 MED ORDER — INSULIN ASPART 100 UNIT/ML ~~LOC~~ SOLN
0.0000 [IU] | Freq: Every day | SUBCUTANEOUS | Status: DC
  Filled 2016-07-16: qty 2

## 2016-07-16 MED ORDER — CALCIUM CARBONATE-VITAMIN D 500-200 MG-UNIT PO TABS
1.0000 | ORAL_TABLET | Freq: Every day | ORAL | Status: DC
  Administered 2016-07-17: 08:00:00 1 via ORAL
  Filled 2016-07-16: qty 1

## 2016-07-16 MED ORDER — INSULIN ASPART 100 UNIT/ML FLEXPEN: 5.0000 [IU] | PEN_INJECTOR | Freq: Three times a day (TID) | SUBCUTANEOUS | Status: DC

## 2016-07-16 MED ORDER — ASPIRIN 300 MG RE SUPP: 300.0000 mg | Freq: Every day | RECTAL | Status: DC

## 2016-07-16 MED ORDER — SODIUM CHLORIDE 0.9 % IV BOLUS (SEPSIS)
1000.0000 mL | Freq: Once | INTRAVENOUS | Status: AC
  Administered 2016-07-16: 1000 mL via INTRAVENOUS

## 2016-07-16 MED ORDER — ONDANSETRON HCL 4 MG/2ML IJ SOLN
4.0000 mg | Freq: Once | INTRAMUSCULAR | Status: AC
  Administered 2016-07-16: 4 mg via INTRAVENOUS

## 2016-07-16 MED ORDER — GLUCERNA SHAKE PO LIQD: 237.0000 mL | Freq: Two times a day (BID) | ORAL | Status: DC

## 2016-07-16 MED ORDER — HYDROCODONE-ACETAMINOPHEN 5-325 MG PO TABS
1.0000 | ORAL_TABLET | ORAL | Status: DC | PRN
  Administered 2016-07-16: 22:00:00 1 via ORAL
  Filled 2016-07-16: qty 1

## 2016-07-16 MED ORDER — GABAPENTIN 300 MG PO CAPS
300.0000 mg | ORAL_CAPSULE | Freq: Two times a day (BID) | ORAL | Status: DC
  Administered 2016-07-16: 300 mg via ORAL
  Filled 2016-07-16 (×2): qty 1

## 2016-07-16 MED ORDER — SIMVASTATIN 20 MG PO TABS: 20.0000 mg | ORAL_TABLET | Freq: Every day | ORAL | Status: DC

## 2016-07-16 MED ORDER — ACETAMINOPHEN 650 MG RE SUPP: 650.0000 mg | RECTAL | Status: DC | PRN

## 2016-07-16 MED ORDER — INSULIN ASPART 100 UNIT/ML ~~LOC~~ SOLN
5.0000 [IU] | Freq: Three times a day (TID) | SUBCUTANEOUS | Status: DC
  Administered 2016-07-16 – 2016-07-17 (×4): 5 [IU] via SUBCUTANEOUS
  Filled 2016-07-16 (×4): qty 5

## 2016-07-16 MED ORDER — PIOGLITAZONE HCL 15 MG PO TABS: 15.0000 mg | ORAL_TABLET | Freq: Every day | ORAL | Status: DC

## 2016-07-16 MED ORDER — LEVOTHYROXINE SODIUM 25 MCG PO TABS
25.0000 ug | ORAL_TABLET | Freq: Every day | ORAL | Status: DC
  Administered 2016-07-17: 08:00:00 25 ug via ORAL
  Filled 2016-07-16: qty 1

## 2016-07-16 MED ORDER — ACETAMINOPHEN 325 MG PO TABS: 650.0000 mg | ORAL_TABLET | ORAL | Status: DC | PRN

## 2016-07-16 MED ORDER — TAMSULOSIN HCL 0.4 MG PO CAPS
0.4000 mg | ORAL_CAPSULE | Freq: Two times a day (BID) | ORAL | Status: DC
  Administered 2016-07-16 – 2016-07-17 (×2): 0.4 mg via ORAL
  Filled 2016-07-16 (×2): qty 1

## 2016-07-16 MED ORDER — LISINOPRIL 10 MG PO TABS
10.0000 mg | ORAL_TABLET | Freq: Every day | ORAL | Status: DC
  Administered 2016-07-17: 08:00:00 10 mg via ORAL
  Filled 2016-07-16: qty 1

## 2016-07-16 MED ORDER — ATORVASTATIN CALCIUM 20 MG PO TABS
40.0000 mg | ORAL_TABLET | Freq: Every day | ORAL | Status: DC
  Administered 2016-07-16: 18:00:00 40 mg via ORAL
  Filled 2016-07-16: qty 2

## 2016-07-16 MED ORDER — CLOPIDOGREL BISULFATE 75 MG PO TABS
75.0000 mg | ORAL_TABLET | Freq: Every day | ORAL | Status: DC
  Administered 2016-07-17: 75 mg via ORAL
  Filled 2016-07-16: qty 1

## 2016-07-16 MED ORDER — FINASTERIDE 5 MG PO TABS
5.0000 mg | ORAL_TABLET | Freq: Every day | ORAL | Status: DC
  Administered 2016-07-17: 5 mg via ORAL
  Filled 2016-07-16: qty 1

## 2016-07-16 MED ORDER — INSULIN GLARGINE 100 UNIT/ML ~~LOC~~ SOLN
18.0000 [IU] | Freq: Every day | SUBCUTANEOUS | Status: DC
  Administered 2016-07-16 – 2016-07-17 (×2): 18 [IU] via SUBCUTANEOUS
  Filled 2016-07-16 (×2): qty 0.18

## 2016-07-16 MED ORDER — POLYETHYLENE GLYCOL 3350 17 G PO PACK
17.0000 g | PACK | Freq: Every day | ORAL | Status: DC
  Administered 2016-07-17: 08:00:00 17 g via ORAL
  Filled 2016-07-16: qty 1

## 2016-07-16 MED ORDER — ACETAMINOPHEN 160 MG/5ML PO SOLN: 650.0000 mg | ORAL | Status: DC | PRN

## 2016-07-16 MED ORDER — ASPIRIN 325 MG PO TABS
325.0000 mg | ORAL_TABLET | Freq: Every day | ORAL | Status: DC
  Administered 2016-07-16 – 2016-07-17 (×2): 325 mg via ORAL
  Filled 2016-07-16 (×2): qty 1

## 2016-07-16 MED ORDER — INSULIN GLARGINE 100 UNIT/ML SOLOSTAR PEN: 18.0000 [IU] | PEN_INJECTOR | SUBCUTANEOUS | Status: DC

## 2016-07-16 MED ORDER — ALBUTEROL SULFATE (2.5 MG/3ML) 0.083% IN NEBU: 2.5000 mg | INHALATION_SOLUTION | RESPIRATORY_TRACT | Status: DC | PRN

## 2016-07-16 NOTE — Clinical Social Work Note (Signed)
Clinical Social Work Assessment  Patient Details  Name: Brian Barry MRN: [id number] Date of Birth: 06/15/1929  Date of referral:  07/16/16               Reason for consult:  Discharge Planning (admitted from [place])                Permission sought to share information with:  Case Manager, Chartered certified accountant granted to share information::  Yes, Verbal Permission Granted  Name::        Agency::  [name]  Relationship::  Wife  Contact Information:     Housing/Transportation Living arrangements for the past 2 months:  Apartment, Charity fundraiser of Information:  Medical Team, Tourist information centre manager, Facility, Patient Patient Interpreter Needed:  None Criminal Activity/Legal Involvement Pertinent to Current Situation/Hospitalization:  No - Comment as needed Significant Relationships:  Other Family Members, Warehouse manager, Spouse Lives with:  Spouse Do you feel safe going back to the place where you live?  Yes Need for family participation in patient care:  Yes (Comment)  Care giving concerns:  Patient admitted from [facility].  Call placed to facility to notify of admission.  Reports he has been doing well with wife at [facility], but is on the radar in case he needs higher level of care as he is weaker per report of [name].  LCSW is still awaiting recommendations from PT/OT as patient has been off unit and in testing.  Will follow up with current needs and if higher level of care is warranted. If not, patient able to return to [facility].   Social Worker assessment / plan:  LCSW aware of consult as patient is from facility. Awaiting review of PT and OT recommendations.  Will continue to assist with disposition.  PLAN:  TBD   SNF ([facility] vs [facility] apartment at [facility])  Employment status:  Retired Nurse, adult PT Recommendations:  Not assessed at this  time Information / Referral to community resources:    NA  Patient/Family's Response to care:  Agreeable to plan  Patient/Family's Understanding of and Emotional Response to Diagnosis, Current Treatment, and Prognosis:  NA assessment completed with facility as patient is off unit.  Will assess patient once able to participate and understand barriers to return or needs.  Emotional Assessment Appearance:  Appears stated age Attitude/Demeanor/Rapport:    Affect (typically observed):  Accepting Orientation:    Alcohol / Substance use:  Not Applicable Psych involvement (Current and /or in the community):  No (Comment)  Discharge Needs  Concerns to be addressed:  Denies Needs/Concerns at this time Readmission within the last 30 days:  No Current discharge risk:  None Barriers to Discharge:  No Barriers Identified   [name], LCSW 07/16/2016, 3:55 PM

## 2016-07-16 NOTE — ED Notes (Signed)
XR bedside.

## 2016-07-16 NOTE — Progress Notes (Signed)
*  PRELIMINARY RESULTS* Echocardiogram 2D Echocardiogram has been performed.  Brian Barry 07/16/2016, 12:56 PM

## 2016-07-16 NOTE — Evaluation (Signed)
Clinical/Bedside Swallow Evaluation Patient Details  Name: Brian Barry MRN: [id number] Date of Birth: 26-Aug-1929  Today's Date: 07/16/2016 Time: SLP Start Time (ACUTE ONLY): 1000 SLP Stop Time (ACUTE ONLY): 1100 SLP Time Calculation (min) (ACUTE ONLY): 60 min  Past Medical History:  Past Medical History:  Diagnosis Date  . Arthritis   . BPH (benign prostatic hyperplasia)   . DM (diabetes mellitus) ([place])   . Hyperlipidemia   . Hypertension   . Hypothyroidism   . Osteoporosis   . Pelvic fracture ([place])   . TIA (transient ischemic attack) 1998, 2014   Past Surgical History:  Past Surgical History:  Procedure Laterality Date  . bilateral hip replacement Bilateral 1998  . Left knee replacement Left 2007  . LUMBAR LAMINECTOMY  2010  . right hip revision Right 2008   HPI:  Pt is a 83 y.o. male with a known history of Cognitive decline baseline per Daughter(receiving Cognitive therapy at [facility]), arthritis, benign prostate never atrophic, transient ischemic attack, hypertension, hyperlipidemia, hypothyroidism, DM per wife who presented to the emergency room with difficulty in speech is started this morning. Patient woke up around 3 AM this morning and had trouble expressing words. He could not speak. Patient's family were concerned and they called the EMS and brought the patient to the emergency room. Patient was recently discharged from rehabilitation facility 1 week ago to home. Patient was evaluated with CT head in the emergency room which showed old stroke but no evidence for any new acute intracranial abnormality. No complaints of any chest pain, shortness of breath. Hospitalist service was consulted for further care of the patient. After arrival in the emergency room patient was able to express all the words and is alert and awake and oriented to time place and person. Daughter and wife stated pt has returned to his baseline for communication(no much of a "talker" at baseline per  family) and suspect he can swallow adequately.   Assessment / Plan / Recommendation Clinical Impression  Pt appears to present w/ an adequate oropharyngeal phase swallow function w/ no apparent oropharyngeal phase dysphagia noted w/ trial consistencies given. Pt consumed trials of thin liquids, puree and soft solids w/ no overt s/s of aspiration noted; adequate oral management and clearing of all bolus trials - pt did require min extra time d/t not wearing the upper partial plate d/t ill-fit. Pt fed self taking his time. Oral Motor exam appeared wfl w/ no lingual/labial weakness noted. Pt appears at his baseline for swallowing following general aspiration precautions - family will help w/ cutting foods for easier mastication d/t ill-fitting, upper partial plate. Pt is verbally communicating and following direct instructions w/ gentle cues/time - this appears at his baseline per family's description. Recommend pt continue w/ his therapy at [facility] as set up; no Cognitive therapy indicated while admitted acutely. Family agreed. SLP Visit Diagnosis: Dysphagia, oropharyngeal phase (R13.12)    Aspiration Risk   (reduced following general precautions)    Diet Recommendation  regular diet; thin liquids. General aspiration precautions; assist w/ tray setup - reduce distractions during meals.   Medication Administration: Whole meds with liquid (or Whole in Puree if easier for swallowing)    Other  Recommendations Recommended Consults:  (dietician f/u as needed) Oral Care Recommendations: Oral care BID;Staff/trained caregiver to provide oral care   Follow up Recommendations None      Frequency and Duration  n/a         Prognosis Prognosis for Safe Diet  Advancement: Good Barriers to Reach Goals: Cognitive deficits (mild per report)      Swallow Study   General Date of Onset: 07/16/16 HPI: Pt is a 83 y.o. male with a known history of Cognitive decline baseline per Daughter(receiving Cognitive  therapy at [facility]), arthritis, benign prostate never atrophic, transient ischemic attack, hypertension, hyperlipidemia, hypothyroidism, DM per wife who presented to the emergency room with difficulty in speech is started this morning. Patient woke up around 3 AM this morning and had trouble expressing words. He could not speak. Patient's family were concerned and they called the EMS and brought the patient to the emergency room. Patient was recently discharged from rehabilitation facility 1 week ago to home. Patient was evaluated with CT head in the emergency room which showed old stroke but no evidence for any new acute intracranial abnormality. No complaints of any chest pain, shortness of breath. Hospitalist service was consulted for further care of the patient. After arrival in the emergency room patient was able to express all the words and is alert and awake and oriented to time place and person. Daughter and wife stated pt has returned to his baseline for communication(no much of a "talker" at baseline per family) and suspect he can swallow adequately. Type of Study: Bedside Swallow Evaluation Previous Swallow Assessment: none reported Diet Prior to this Study: Regular;Thin liquids Temperature Spikes Noted: No (wbc not elevated) Respiratory Status: Room air History of Recent Intubation: No Behavior/Cognition: Alert;Cooperative;Pleasant mood;Distractible;Requires cueing (Cognitive decline baseline per report) Oral Cavity Assessment: Within Functional Limits;Dry Oral Care Completed by SLP: Recent completion by staff Oral Cavity - Dentition: Missing dentition (wears partials - upper partial is ill-fitting) Vision: Functional for self-feeding Self-Feeding Abilities: Able to feed self;Needs set up Patient Positioning: Upright in bed Baseline Vocal Quality: Normal;Low vocal intensity (not much of a "talker" per daughter) Volitional Cough: Strong Volitional Swallow: Able to elicit     Oral/Motor/Sensory Function Overall Oral Motor/Sensory Function: Within functional limits   Ice Chips Ice chips: Within functional limits Presentation: Spoon (2 trials)   Thin Liquid Thin Liquid: Within functional limits Presentation: Cup;Self Fed;Straw (setup; ~5 ozs total)    Nectar Thick Nectar Thick Liquid: Not tested   Honey Thick Honey Thick Liquid: Not tested   Puree Puree: Within functional limits Presentation: Self Fed;Spoon (8 ozs)   Solid   GO   Solid: Within functional limits (mech soft, moistened) Presentation: Self Fed;Spoon (4 trials)    Functional Assessment Tool Used: clinical judgement Functional Limitations: Swallowing Swallow Current Status [id number]): At least 1 percent but less than 20 percent impaired, limited or restricted Swallow Goal Status [phone]): At least 1 percent but less than 20 percent impaired, limited or restricted Swallow Discharge Status [id number]): At least 1 percent but less than 20 percent impaired, limited or restricted     Orinda Kenner, [place], CCC-SLP Saron Tweed 07/16/2016,12:29 PM

## 2016-07-16 NOTE — Progress Notes (Signed)
Chaplain provided an Advance Directive to patient. Since there was a speech therapist doing a therapy with the patient, a family member received an AD and suggested that Chaplain should return to follow up. Around 11:10AM Chaplain went back for a follow up, but patient was not in the room. Chaplain talked with the patient's nurse and asked her to call chaplain anytime Pt. Would have a question regarding the AD chaplain provided.

## 2016-07-16 NOTE — Progress Notes (Signed)
OT Cancellation Note  Patient Details Name: LEWI BARTOLD MRN: [id number] DOB: 1930-01-30   Cancelled Treatment:    Reason Eval/Treat Not Completed: Patient at procedure or test/ unavailable. Order received, chart reviewed. Briefly spoke with patient's daughters outside of room and upon entry into patient's room, pt was in need to prepare for EEG. Will re-attempt OT evaluation at a later time as pt is available.  Corky Sox, OTR/L 07/16/2016, 2:03 PM

## 2016-07-16 NOTE — ED Provider Notes (Signed)
[facility] Emergency Department Provider Note   ____________________________________________   First MD Initiated Contact with Patient 07/16/16 0401     (approximate)  I have reviewed the triage vital signs and the nursing notes.   HISTORY  Chief Complaint Altered Mental Status    HPI ABDULMAJID SUNN is a 83 y.o. male brought to the ED via EMS from [facility] [place] with a chief complaint of altered mental status. Patient is at 38 for rehabilitation status post fall with pelvic fracture. Wife states patient was in his baseline state of health at 10 PM when he went to bed. She awoke at 3 AM because he seemed restless. Wife tried to wake patient up but states he was confused, not speaking and unable to grip her hand. History of TIAs. Denies recent fever, chills, chest pain, shortness of breath, abdominal pain, vomiting, diarrhea. Currently complains of mild nausea. Nothing makes his symptoms better or worse.   Past Medical History:  Diagnosis Date  . Arthritis   . BPH (benign prostatic hyperplasia)   . DM (diabetes mellitus) ([place])   . Hyperlipidemia   . Hypertension   . Hypothyroidism   . Osteoporosis   . Pelvic fracture ([place])   . TIA (transient ischemic attack) 1998, 2014    Patient Active Problem List   Diagnosis Date Noted  . Ascending aortic aneurysm ([place]) 05/19/2016  . Hyponatremia 05/19/2016  . Generalized weakness 05/19/2016  . Sepsis due to pneumonia ([place]) 05/18/2016  . Hip fracture ([place]) 09/10/2015    Past Surgical History:  Procedure Laterality Date  . bilateral hip replacement Bilateral 1998  . Left knee replacement Left 2007  . LUMBAR LAMINECTOMY  2010  . right hip revision Right 2008    Prior to Admission medications   Medication Sig Start Date End Date Taking? Authorizing Provider  feeding supplement, GLUCERNA SHAKE, (GLUCERNA SHAKE) LIQD Take 237 mLs by mouth 2 (two) times daily between meals. 05/19/16  Yes Theodoro Grist, MD    polyethylene glycol (MIRALAX / GLYCOLAX) packet Take 17 g by mouth daily. 09/12/15  Yes Richard Leslye Peer, MD  senna-docusate (SENOKOT-S) 8.6-50 MG tablet Take 1 tablet by mouth at bedtime as needed for mild constipation. 09/12/15  Yes Loletha Grayer, MD  ACCU-CHEK [facility] LANCETS lancets Use as instructed twice daily 12/16/15   Historical Provider, MD  albuterol (PROVENTIL HFA;VENTOLIN HFA) 108 (90 Base) MCG/ACT inhaler Inhale into the lungs. 04/17/16 04/17/17  Historical Provider, MD  calcium-vitamin D (OSCAL WITH D) 500-200 MG-UNIT tablet Take 1 tablet by mouth daily.    Historical Provider, MD  clopidogrel (PLAVIX) 75 MG tablet Take 75 mg by mouth daily. 07/11/15   Historical Provider, MD  dextromethorphan (DELSYM) 30 MG/5ML liquid Take 5 mLs (30 mg total) by mouth 2 (two) times daily. Patient not taking: Reported on 07/12/2016 05/19/16   Theodoro Grist, MD  gabapentin (NEURONTIN) 300 MG capsule Take by mouth. 07/10/16 07/10/17  Historical Provider, MD  HYDROcodone-acetaminophen (NORCO/VICODIN) 5-325 MG tablet Take 1 tablet by mouth every 4 (four) hours as needed for moderate pain. Patient not taking: Reported on 07/12/2016 06/12/16   Lavonia Drafts, MD  hydrocortisone (ANUSOL-HC) 25 MG suppository Place rectally.    Historical Provider, MD  insulin aspart (NOVOLOG) 100 UNIT/ML FlexPen Inject 5 Units into the skin 3 (three) times daily with meals. Patient not taking: Reported on 07/12/2016 05/19/16   Theodoro Grist, MD  LANTUS SOLOSTAR 100 UNIT/ML Solostar Pen Inject 18 Units into the skin every morning.  06/13/15  Historical Provider, MD  levothyroxine (SYNTHROID, LEVOTHROID) 25 MCG tablet Take 1 tablet by mouth daily. 04/26/16   Historical Provider, MD  lisinopril (PRINIVIL,ZESTRIL) 20 MG tablet Take 10 mg by mouth daily.  07/11/15   Historical Provider, MD  meclizine (ANTIVERT) 25 MG tablet Take 1 tablet (25 mg total) by mouth 3 (three) times daily as needed. Patient not taking: Reported on 07/12/2016 05/30/16    Merlyn Lot, MD  metFORMIN (GLUCOPHAGE) 500 MG tablet Take 500 mg by mouth 2 (two) times daily.  08/12/15   Historical Provider, MD  pioglitazone (ACTOS) 15 MG tablet Take by mouth. 07/09/16 07/09/17  Historical Provider, MD  simvastatin (ZOCOR) 20 MG tablet Take 20 mg by mouth at bedtime.  07/11/15   Historical Provider, MD  tamsulosin (FLOMAX) 0.4 MG CAPS capsule Take 0.4 mg by mouth 2 (two) times daily. 08/08/15   Historical Provider, MD  Teriparatide, Recombinant, 600 MCG/2.4ML SOLN Inject into the skin. 07/09/16 07/09/17  Historical Provider, MD    Allergies Patient has no known allergies.  Family History  Problem Relation Age of Onset  . CVA Father 48  . Prostate cancer Neg Hx   . Kidney cancer Neg Hx   . Bladder Cancer Neg Hx     Social History Social History  Substance Use Topics  . Smoking status: Never Smoker  . Smokeless tobacco: Never Used  . Alcohol use No    Review of Systems Constitutional: No fever/chills. Eyes: No visual changes. ENT: No sore throat. Cardiovascular: Denies chest pain. Respiratory: Denies shortness of breath. Gastrointestinal: No abdominal pain.  No nausea, no vomiting.  No diarrhea.  No constipation. Genitourinary: Negative for dysuria. Musculoskeletal: Negative for back pain. Skin: Negative for rash. Neurological: Positive for confusion and weak hand grip. Negative for headaches or numbness.  10-point ROS otherwise negative.  ____________________________________________   PHYSICAL EXAM:  VITAL SIGNS: ED Triage Vitals  Enc Vitals Group     BP 07/16/16 0351 (!) 163/66     Pulse Rate 07/16/16 0351 62     Resp 07/16/16 0351 15     Temp 07/16/16 0351 98 F (36.7 C)     Temp Source 07/16/16 0351 Oral     SpO2 07/16/16 0351 97 %     Weight 07/16/16 0352 156 lb 3.2 oz (70.9 kg)     Height 07/16/16 0352 5\' 9"  (1.753 m)     Head Circumference --      Peak Flow --      Pain Score --      Pain Loc --      Pain Edu? --      Excl. in  [place]? --     Constitutional: Alert and oriented to person and place. Frail appearing and in no acute distress. Eyes: Conjunctivae are normal. PERRL. EOMI. Head: Atraumatic. Nose: No congestion/rhinnorhea. Mouth/Throat: Mucous membranes are moist.  Oropharynx non-erythematous. Neck: No stridor.  Right carotid bruit. Cardiovascular: Normal rate, regular rhythm. Grossly normal heart sounds.  Good peripheral circulation. Respiratory: Normal respiratory effort.  No retractions. Lungs CTAB. Gastrointestinal: Soft and nontender. No distention. No abdominal bruits. No CVA tenderness. Musculoskeletal: No lower extremity tenderness nor edema.  No joint effusions. Neurologic:  Normal speech and language. CN II-XII mostly intact. No gross focal neurologic deficits are appreciated.  Skin:  Skin is warm, dry and intact. No rash noted. Psychiatric: Mood and affect are normal. Speech and behavior are normal.  ____________________________________________   LABS (all labs ordered are listed, but only abnormal  results are displayed)  Labs Reviewed  COMPREHENSIVE METABOLIC PANEL - Abnormal; Notable for the following:       Result Value   Sodium 131 (*)    Chloride 94 (*)    Glucose, Bld 115 (*)    Total Protein 6.4 (*)    ALT 12 (*)    All other components within normal limits  CBC - Abnormal; Notable for the following:    RBC 3.59 (*)    Hemoglobin 11.7 (*)    HCT 33.8 (*)    All other components within normal limits  GLUCOSE, CAPILLARY - Abnormal; Notable for the following:    Glucose-Capillary 110 (*)    All other components within normal limits  TROPONIN I  URINALYSIS, COMPLETE (UACMP) WITH MICROSCOPIC  CBG MONITORING, ED   ____________________________________________  EKG  ED ECG REPORT I, Tomie Elko J, the attending physician, personally viewed and interpreted this ECG.   Date: 07/16/2016  EKG Time: 0343  Rate: 74  Rhythm: normal EKG, normal sinus rhythm  Axis: Normal   Intervals:none  ST&T Change: Nonspecific  ____________________________________________  RADIOLOGY  CT head without contrast interpreted per Dr. Collins Scotland: 1. No acute intracranial abnormality.  2. Advanced chronic microvascular ischemia and multiple old  bilateral basal ganglia lacunar infarcts.   Portable chest x-ray interpreted per Dr. Collins Scotland: No active disease. ____________________________________________   PROCEDURES  Procedure(s) performed: None  Procedures  Critical Care performed: No  ____________________________________________   INITIAL IMPRESSION / ASSESSMENT AND PLAN / ED COURSE  Pertinent labs & imaging results that were available during my care of the patient were reviewed by me and considered in my medical decision making (see chart for details).  83 year old male brought for altered mental status, confusion and weakness. Without intervention, spouse states his symptoms have resolved. He is now opening his eyes and participating in conversation. Symptoms suggestive of TIA. Updated patient and family members of laboratory and imaging results. Awaiting urine specimen. Nursing to perform bedside swallow study; oral aspirin if patient can swallow safely. Will discuss with hospitalist to evaluate patient in the emergency department for admission.  Clinical Course as of Jul 16 521  Mon Jul 16, 2016  0520 Patient failed swallow screen.  [JS]    Clinical Course User Index [JS] Paulette Blanch, MD     ____________________________________________   FINAL CLINICAL IMPRESSION(S) / ED DIAGNOSES  Final diagnoses:  Confusion  Transient cerebral ischemia, unspecified type      NEW MEDICATIONS STARTED DURING THIS VISIT:  New Prescriptions   No medications on file     Note:  This document was prepared using Dragon voice recognition software and may include unintentional dictation errors.    Paulette Blanch, MD 07/16/16 [phone]

## 2016-07-16 NOTE — Progress Notes (Signed)
OT Cancellation Note  Patient Details Name: ORON POPPEN MRN: [id number] DOB: 04-30-1930   Cancelled Treatment:    Reason Eval/Treat Not Completed: Patient at procedure or test/ unavailable. On 3rd attempt to see, pt still out for testing. Will re-attempt OT evaluation at later date/time as available.  Corky Sox, OTR/L 07/16/2016, 3:18 PM

## 2016-07-16 NOTE — ED Triage Notes (Signed)
Patient arrived to ER with difficulty in speech that started this morning. Patient woke up around 3 AM this morning and had trouble expressing words. He could not speak. Patient's family were concerned and they called the EMS and brought the patient to the emergency room.   Patient was recently discharged from rehabilitation facility 1 week ago to home.  Pt had no complaints CP or SOB.  During triage pt is able to express all the words and is alert and awake and oriented to time place and person.  Pt has hx of arthritis, benign prostate never atrophic, transient ischemic attack, hypertension, hyperlipidemia, and hypothyroidism.

## 2016-07-16 NOTE — Progress Notes (Signed)
PT Cancellation Note  Patient Details Name: BEKIM VENNE MRN: [id number] DOB: 1930/03/16   Cancelled Treatment:    Reason Eval/Treat Not Completed: Patient at procedure or test/unavailable.  Pt currently off floor for testing.  Will re-attempt PT eval at a later date/time.  Leitha Bleak, PT 07/16/16, 2:25 PM [phone]

## 2016-07-16 NOTE — Consult Note (Signed)
Referring Physician: Posey Pronto    Chief Complaint: Difficulty with speech  HPI: Brian Barry is an 83 y.o. male with a history of TIA's in the past.  Patient reports that he is amnestic of the event.  Per daughter patient awakened at 3 AM this morning and had trouble expressing words. He could not speak. Patient's family were concerned and they called the EMS and brought the patient to the emergency room for further evaluation.  Symptoms had resolved by the time of presentation.  Initial NIHSS of 0.   Patient had a hip fracture this pst summer and has gradually gone downhill since that time.  Made most of his decline after he and his wife moved to a senior living facility in [place].  Recently discharged from a rehab facility last week and has been more confused since that time and unable to sleep.   Patient has had falls and TIA's in the past and patient unable to recall symptoms surrounding these instances.    Date last known well: Date: 07/15/2016 Time last known well: Time: 22:30 tPA Given: No: Resolution of symptoms  Past Medical History:  Diagnosis Date  . Arthritis   . BPH (benign prostatic hyperplasia)   . DM (diabetes mellitus) ([place])   . Hyperlipidemia   . Hypertension   . Hypothyroidism   . Osteoporosis   . Pelvic fracture ([place])   . TIA (transient ischemic attack) 1998, 2014    Past Surgical History:  Procedure Laterality Date  . bilateral hip replacement Bilateral 1998  . Left knee replacement Left 2007  . LUMBAR LAMINECTOMY  2010  . right hip revision Right 2008    Family History  Problem Relation Age of Onset  . CVA Father 54  . Prostate cancer Neg Hx   . Kidney cancer Neg Hx   . Bladder Cancer Neg Hx    Social History:  reports that he has never smoked. He has never used smokeless tobacco. He reports that he does not drink alcohol or use drugs.  Allergies: No Known Allergies  Medications:  I have reviewed the patient's current medications. Prior to  Admission:  Prescriptions Prior to Admission  Medication Sig Dispense Refill Last Dose  . ACCU-CHEK SOFTCLIX LANCETS lancets Use as instructed twice daily   Taking  . aspirin EC 81 MG tablet Take 81 mg by mouth daily.   07/16/2016 at Unknown time  . calcium-vitamin D (OSCAL WITH D) 500-200 MG-UNIT tablet Take 1 tablet by mouth daily.   07/16/2016 at Unknown time  . clopidogrel (PLAVIX) 75 MG tablet Take 75 mg by mouth daily.   07/16/2016 at Unknown time  . feeding supplement, GLUCERNA SHAKE, (GLUCERNA SHAKE) LIQD Take 237 mLs by mouth 2 (two) times daily between meals. 60 Can 5 prn  . finasteride (PROSCAR) 5 MG tablet Take 5 mg by mouth daily.   07/16/2016 at Unknown time  . gabapentin (NEURONTIN) 300 MG capsule Take by mouth.   07/16/2016 at Unknown time  . LANTUS SOLOSTAR 100 UNIT/ML Solostar Pen Inject 18 Units into the skin every morning.    07/16/2016 at Unknown time  . levothyroxine (SYNTHROID, LEVOTHROID) 25 MCG tablet Take 1 tablet by mouth daily.   07/16/2016 at Unknown time  . lisinopril (PRINIVIL,ZESTRIL) 20 MG tablet Take 10 mg by mouth daily.    07/16/2016 at Unknown time  . metFORMIN (GLUCOPHAGE) 500 MG tablet Take 1,000 mg by mouth 2 (two) times daily.    07/16/2016 at Unknown time  .  polyethylene glycol (MIRALAX / GLYCOLAX) packet Take 17 g by mouth daily. 14 each 0 prn  . senna-docusate (SENOKOT-S) 8.6-50 MG tablet Take 1 tablet by mouth at bedtime as needed for mild constipation. 30 tablet 0 prn  . tamsulosin (FLOMAX) 0.4 MG CAPS capsule Take 0.4 mg by mouth 2 (two) times daily.   07/16/2016 at Unknown time  . dextromethorphan (DELSYM) 30 MG/5ML liquid Take 5 mLs (30 mg total) by mouth 2 (two) times daily. (Patient not taking: Reported on 07/12/2016) 89 mL 0 Not Taking at Unknown time  . HYDROcodone-acetaminophen (NORCO/VICODIN) 5-325 MG tablet Take 1 tablet by mouth every 4 (four) hours as needed for moderate pain. (Patient not taking: Reported on 07/12/2016) 20 tablet 0 Not Taking at Unknown time  .  hydrocortisone (ANUSOL-HC) 25 MG suppository Place rectally.   Not Taking at Unknown time  . insulin aspart (NOVOLOG) 100 UNIT/ML FlexPen Inject 5 Units into the skin 3 (three) times daily with meals. (Patient not taking: Reported on 07/12/2016) 15 mL 11 Not Taking at Unknown time  . meclizine (ANTIVERT) 25 MG tablet Take 1 tablet (25 mg total) by mouth 3 (three) times daily as needed. (Patient not taking: Reported on 07/12/2016) 6 tablet 0 Not Taking at Unknown time  . pioglitazone (ACTOS) 15 MG tablet Take by mouth.   Not Taking at Unknown time  . simvastatin (ZOCOR) 20 MG tablet Take 20 mg by mouth at bedtime.    Not Taking at Unknown time  . Teriparatide, Recombinant, 600 MCG/2.4ML SOLN Inject into the skin.   Not Taking at Unknown time  . [DISCONTINUED] albuterol (PROVENTIL HFA;VENTOLIN HFA) 108 (90 Base) MCG/ACT inhaler Inhale into the lungs.   Not Taking   Scheduled: . aspirin  300 mg Rectal Daily   Or  . aspirin  325 mg Oral Daily  . [START ON 07/17/2016] calcium-vitamin D  1 tablet Oral Daily  . [START ON 07/17/2016] clopidogrel  75 mg Oral Daily  . enoxaparin (LOVENOX) injection  40 mg Subcutaneous Q24H  . feeding supplement (GLUCERNA SHAKE)  237 mL Oral BID BM  . finasteride  5 mg Oral Daily  . gabapentin  300 mg Oral BID  . insulin aspart  0-15 Units Subcutaneous TID WC  . insulin aspart  0-5 Units Subcutaneous QHS  . insulin aspart  5 Units Subcutaneous TID WC  . insulin glargine  18 Units Subcutaneous Daily  . [START ON 07/17/2016] levothyroxine  25 mcg Oral QAC breakfast  . [START ON 07/17/2016] lisinopril  10 mg Oral Daily  . [START ON 07/17/2016] pioglitazone  15 mg Oral Daily  . polyethylene glycol  17 g Oral Daily  . simvastatin  20 mg Oral QHS  . tamsulosin  0.4 mg Oral BID    ROS: History obtained from the patient  General ROS: negative for - chills, fatigue, fever, night sweats, weight gain or weight loss Psychological ROS: as noted in HPI Ophthalmic ROS: negative for -  blurry vision, double vision, eye pain or loss of vision ENT ROS: negative for - epistaxis, nasal discharge, oral lesions, sore throat, tinnitus or vertigo Allergy and Immunology ROS: negative for - hives or itchy/watery eyes Hematological and Lymphatic ROS: negative for - bleeding problems, bruising or swollen lymph nodes Endocrine ROS: negative for - galactorrhea, hair pattern changes, polydipsia/polyuria or temperature intolerance Respiratory ROS: snoring and sleep apnea Cardiovascular ROS: negative for - chest pain, dyspnea on exertion, edema or irregular heartbeat Gastrointestinal ROS: negative for - abdominal pain, diarrhea,  hematemesis, nausea/vomiting or stool incontinence Genito-Urinary ROS: negative for - dysuria, hematuria, incontinence or urinary frequency/urgency Musculoskeletal ROS: negative for - joint swelling or muscular weakness Neurological ROS: as noted in HPI Dermatological ROS: negative for rash and skin lesion changes  Physical Examination: Blood pressure (!) 152/65, pulse 67, temperature 97.7 F (36.5 C), temperature source Oral, resp. rate 18, height 5\' 9"  (1.753 m), weight 68.5 kg (151 lb), SpO2 99 %.  HEENT-  Normocephalic, no lesions, without obvious abnormality.  Normal external eye and conjunctiva.  Normal TM's bilaterally.  Normal auditory canals and external ears. Normal external nose, mucus membranes and septum.  Normal pharynx. Cardiovascular- S1, S2 normal, pulses palpable throughout   Lungs- chest clear, no wheezing, rales, normal symmetric air entry Abdomen- soft, non-tender; bowel sounds normal; no masses,  no organomegaly Extremities- no edema Lymph-no adenopathy palpable Musculoskeletal-no joint tenderness, deformity or swelling Skin-warm and dry, no hyperpigmentation, vitiligo, or suspicious lesions  Neurological Examination   Mental Status: Alert.  Poor memory.  Speech fluent without evidence of aphasia.  Able to follow 3 step commands without  difficulty. Cranial Nerves: II: Discs flat bilaterally; Visual fields grossly normal, pupils equal, round, reactive to light and accommodation III,IV, VI: ptosis not present, extra-ocular motions intact bilaterally V,VII: smile symmetric, facial light touch sensation normal bilaterally VIII: hearing normal bilaterally IX,X: gag reflex present XI: bilateral shoulder shrug XII: midline tongue extension Motor: Right : Upper extremity   5/5        Left:     Upper extremity   5/5  Lower extremity   5/5proximally with foot drop     Lower extremity   5/5 Tone and bulk:normal tone throughout; no atrophy noted Sensory: Pinprick and light touch intact throughout, bilaterally Deep Tendon Reflexes: 2+ in the upper extremities, 1+ at the right knee and absent otherwise.   Plantars: Right: downgoing   Left: downgoing Cerebellar: normal finger-to-nose and normal heel-to-shin testing bilaterally Gait: not tested due to safety concerns.      Laboratory Studies:  Basic Metabolic Panel:  Recent Labs Lab 07/16/16 0404  NA 131*  K 3.8  CL 94*  CO2 31  GLUCOSE 115*  BUN 12  CREATININE 0.69  CALCIUM 8.9    Liver Function Tests:  Recent Labs Lab 07/16/16 0404  AST 22  ALT 12*  ALKPHOS 106  BILITOT 0.3  PROT 6.4*  ALBUMIN 3.9   No results for input(s): LIPASE, AMYLASE in the last 168 hours. No results for input(s): AMMONIA in the last 168 hours.  CBC:  Recent Labs Lab 07/16/16 0404  WBC 4.4  HGB 11.7*  HCT 33.8*  MCV 94.3  PLT 216    Cardiac Enzymes:  Recent Labs Lab 07/16/16 0404  TROPONINI <0.03    BNP: Invalid input(s): POCBNP  CBG:  Recent Labs Lab 07/16/16 0418 07/16/16 0819  GLUCAP 110* 102*    Microbiology: Results for orders placed or performed in visit on 07/12/16  Microscopic Examination     Status: None   Collection Time: 07/12/16  2:58 PM  Result Value Ref Range Status   WBC, UA None seen 0 - 5 /hpf Final   RBC, UA 0-2 0 - 2 /hpf Final    Epithelial Cells (non renal) None seen 0 - 10 /hpf Final   Bacteria, UA None seen None seen/Few Final    Coagulation Studies: No results for input(s): LABPROT, INR in the last 72 hours.  Urinalysis:  Recent Labs Lab 07/12/16 1458 07/16/16 0404  COLORURINE  --  STRAW*  LABSPEC  --  1.008  PHURINE  --  8.0  GLUCOSEU Negative NEGATIVE  HGBUR  --  NEGATIVE  BILIRUBINUR Negative NEGATIVE  KETONESUR  --  NEGATIVE  PROTEINUR Negative NEGATIVE  NITRITE Negative NEGATIVE  LEUKOCYTESUR Negative NEGATIVE    Lipid Panel: No results found for: CHOL, TRIG, HDL, CHOLHDL, VLDL, LDLCALC  HgbA1C: No results found for: HGBA1C  Urine Drug Screen:  No results found for: LABOPIA, COCAINSCRNUR, LABBENZ, AMPHETMU, THCU, LABBARB  Alcohol Level: No results for input(s): ETH in the last 168 hours.   Imaging: Ct Head Wo Contrast  Result Date: 07/16/2016 CLINICAL DATA:  Altered mental status EXAM: CT HEAD WITHOUT CONTRAST TECHNIQUE: Contiguous axial images were obtained from the base of the skull through the vertex without intravenous contrast. COMPARISON:  Head CT 05/30/2016 FINDINGS: Brain: No mass lesion, intraparenchymal hemorrhage or extra-axial collection. No evidence of acute cortical infarct. There are multiple bilateral basal ganglia lacunar infarcts, unchanged. There is periventricular hypoattenuation compatible with chronic microvascular disease. Vascular: No hyperdense vessel or unexpected calcification. Skull: Normal visualized skull base, calvarium and extracranial soft tissues. Sinuses/Orbits: No sinus fluid levels or advanced mucosal thickening. No mastoid effusion. Normal orbits. IMPRESSION: 1. No acute intracranial abnormality. 2. Advanced chronic microvascular ischemia and multiple old bilateral basal ganglia lacunar infarcts. Electronically Signed   By: Ulyses Jarred M.D.   On: 07/16/2016 04:46   Mr Brain Wo Contrast  Result Date: 07/16/2016 CLINICAL DATA:  New onset difficulty with  speech beginning today. Difficulty expressing words since 3 a.m. Recent discharge from the rehabilitation facility. EXAM: MRI HEAD WITHOUT CONTRAST MRA HEAD WITHOUT CONTRAST TECHNIQUE: Multiplanar, multiecho pulse sequences of the brain and surrounding structures were obtained without intravenous contrast. Angiographic images of the head were obtained using MRA technique without contrast. COMPARISON:  CT without contrast from the same day. FINDINGS: MRI HEAD FINDINGS Brain: Remote lacunar infarcts are present within the basal ganglia bilaterally. Advanced atrophy and white matter changes are present bilaterally. A remote infarct is present in the anterior right frontal lobe. Extensive subcortical white matter changes associated with this remote infarct. Ventricles are proportionate to the degree of atrophy. No significant extra-axial fluid collection is present. There are remote lacunar infarcts in the midbrain. White matter changes extend into the brainstem. The the cerebellum is unremarkable. Vascular: Flow is present in the major intracranial arteries. Skull and upper cervical spine: Skullbase is within normal limits. Midline sagittal structures are unremarkable. Marrow signal is normal. The craniocervical junction is within normal limits. Sinuses/Orbits: The paranasal sinuses and mastoid air cells are clear. Bilateral lens replacements are present. The globes and orbits are intact. MRA HEAD FINDINGS The internal carotid arteries demonstrate atherosclerotic changes at the cavernous internal carotid arteries bilaterally without significant stenosis. There is no focal stenosis through the ICA termini bilaterally. The A1 and M1 segments are normal. The MCA bifurcations are intact. There is some attenuation of distal MCA branch vessels bilaterally without a significant proximal stenosis or occlusion. The left vertebral artery is slightly dominant to the right. The left PICA origin is visualized and normal. The right  AICA is dominant. Basilar artery is normal. Both posterior cerebral arteries originate the basilar tip. PCA branch vessels are within normal limits. IMPRESSION: 1. No acute intracranial abnormality. 2. Remote lacunar infarcts of the basal ganglia bilaterally. 3. 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 4. Mild distal small vessel disease without significant proximal stenosis, aneurysm, or branch vessel occlusion within the  circle of Willis. Electronically Signed   By: San Morelle M.D.   On: 07/16/2016 12:45   US Carotid Bilateral (at [facility] And [facility] Only)  Result Date: 07/16/2016 CLINICAL DATA:  TIA. EXAM: BILATERAL CAROTID DUPLEX ULTRASOUND TECHNIQUE: Pearline Cables scale imaging, color Doppler and duplex ultrasound were performed of bilateral carotid and vertebral arteries in the neck. COMPARISON:  09/23/2012 FINDINGS: Criteria: Quantification of carotid stenosis is based on velocity parameters that correlate the residual internal carotid diameter with NASCET-based stenosis levels, using the diameter of the distal internal carotid lumen as the denominator for stenosis measurement. The following velocity measurements were obtained: RIGHT ICA:  90 cm/sec CCA:  76 cm/sec SYSTOLIC ICA/CCA RATIO:  1.2 DIASTOLIC ICA/CCA RATIO:  1.6 ECA:  88 cm/sec LEFT ICA:  90 cm/sec CCA:  79 cm/sec SYSTOLIC ICA/CCA RATIO:  1.1 DIASTOLIC ICA/CCA RATIO:  2.2 ECA:  100 cm/sec RIGHT CAROTID ARTERY: Small amount of echogenic plaque at the right carotid bulb without significant stenosis. External carotid artery is patent with normal waveform. Minimal plaque in the proximal internal carotid artery. Normal waveforms and velocities in the internal carotid artery. RIGHT VERTEBRAL ARTERY: Antegrade flow and normal waveform in the right vertebral artery. LEFT CAROTID ARTERY: Small amount of echogenic plaque at the left carotid bulb. External carotid artery is patent with normal waveform. Small amount of plaque in the proximal  internal carotid artery. Normal waveforms and velocities in the internal carotid artery. LEFT VERTEBRAL ARTERY: Antegrade flow and normal waveform in the left vertebral artery. IMPRESSION: Mild atherosclerotic disease in the carotid arteries. Estimated degree of stenosis in the internal carotid arteries is less than 50% bilaterally. Patent vertebral arteries with antegrade flow. Electronically Signed   By: Markus Daft M.D.   On: 07/16/2016 13:04   Dg Chest Port 1 View  Result Date: 07/16/2016 CLINICAL DATA:  Confusion EXAM: PORTABLE CHEST 1 VIEW COMPARISON:  Chest radiograph 05/30/2016 FINDINGS: The heart size and mediastinal contours are within normal limits, allowing for AP technique. Both lungs are clear. The visualized skeletal structures are unremarkable. IMPRESSION: No active disease. Electronically Signed   By: Ulyses Jarred M.D.   On: 07/16/2016 04:40   Mr Jodene Nam Head/brain [id number] Cm  Result Date: 07/16/2016 CLINICAL DATA:  New onset difficulty with speech beginning today. Difficulty expressing words since 3 a.m. Recent discharge from the rehabilitation facility. EXAM: MRI HEAD WITHOUT CONTRAST MRA HEAD WITHOUT CONTRAST TECHNIQUE: Multiplanar, multiecho pulse sequences of the brain and surrounding structures were obtained without intravenous contrast. Angiographic images of the head were obtained using MRA technique without contrast. COMPARISON:  CT without contrast from the same day. FINDINGS: MRI HEAD FINDINGS Brain: Remote lacunar infarcts are present within the basal ganglia bilaterally. Advanced atrophy and white matter changes are present bilaterally. A remote infarct is present in the anterior right frontal lobe. Extensive subcortical white matter changes associated with this remote infarct. Ventricles are proportionate to the degree of atrophy. No significant extra-axial fluid collection is present. There are remote lacunar infarcts in the midbrain. White matter changes extend into the brainstem. The  the cerebellum is unremarkable. Vascular: Flow is present in the major intracranial arteries. Skull and upper cervical spine: Skullbase is within normal limits. Midline sagittal structures are unremarkable. Marrow signal is normal. The craniocervical junction is within normal limits. Sinuses/Orbits: The paranasal sinuses and mastoid air cells are clear. Bilateral lens replacements are present. The globes and orbits are intact. MRA HEAD FINDINGS The internal carotid arteries demonstrate atherosclerotic changes at the cavernous internal  carotid arteries bilaterally without significant stenosis. There is no focal stenosis through the ICA termini bilaterally. The A1 and M1 segments are normal. The MCA bifurcations are intact. There is some attenuation of distal MCA branch vessels bilaterally without a significant proximal stenosis or occlusion. The left vertebral artery is slightly dominant to the right. The left PICA origin is visualized and normal. The right AICA is dominant. Basilar artery is normal. Both posterior cerebral arteries originate the basilar tip. PCA branch vessels are within normal limits. IMPRESSION: 1. No acute intracranial abnormality. 2. Remote lacunar infarcts of the basal ganglia bilaterally. 3. 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 4. Mild distal small vessel disease without significant proximal stenosis, aneurysm, or branch vessel occlusion within the circle of Willis. Electronically Signed   By: San Morelle M.D.   On: 07/16/2016 12:45    Assessment: 83 y.o. male with a history of TIA's who presents after an episode of difficulty with speech.  Symptoms now resolved.  MRI of the brain reviewed and shows no acute changes but does show evidence of old infarcts and small vessel disease.  Carotid dopplers show no evidence of hemodynamically significant stenosis.  Patient on ASA and Plavix at home.  Further work up recommended.    Stroke Risk Factors - diabetes  mellitus, hyperlipidemia and hypertension  Plan: 1. HgbA1c, fasting lipid panel 2. EEG 3. PT consult, OT consult, Speech consult 4. Echocardiogram 5. Prophylactic therapy-Continue ASA and Plavix 6. NPO until RN stroke swallow screen 7. Telemetry monitoring 8. Frequent neuro checks 9. Patient had been referred to neurology with Dr. Manuella Ghazi.  Patient to keep this appointment and can be scheduled for a sleep study at that time.     Alexis Goodell, MD Neurology [phone] 07/16/2016, 1:32 PM

## 2016-07-16 NOTE — ED Notes (Signed)
Patient transported to CT 

## 2016-07-16 NOTE — H&P (Addendum)
[facility] [place] NAME: Brian Barry    MR#:  [id number]  DATE OF BIRTH:  1929/12/08  DATE OF ADMISSION:  07/16/2016  PRIMARY CARE PHYSICIAN: BABAOFF, Caryl Bis, MD   REQUESTING/REFERRING PHYSICIAN:   CHIEF COMPLAINT:   Chief Complaint  Patient presents with  . Altered Mental Status    HISTORY OF PRESENT ILLNESS: Brian Barry  is a 83 y.o. male with a known history of Arthritis, benign prostate never atrophic, transient ischemic attack, hypertension, hyperlipidemia, hypothyroidism presented to the emergency room with difficulty in speech is started this morning. Patient woke up around 3 AM this morning and had trouble expressing words. He could not speak. Patient's family were concerned and they called the EMS and brought the patient to the emergency room. Patient was recently discharged from rehabilitation facility 1 week ago to home. Patient was evaluated with CT head in the emergency room which showed old stroke but no evidence for any new acute intracranial abnormality. No complaints of any chest pain, shortness of breath. Hospitalist service was consulted for further care of the patient. After arrival in the emergency room patient was able to express all the words and is alert and awake and oriented to time place and person.  PAST MEDICAL HISTORY:   Past Medical History:  Diagnosis Date  . Arthritis   . BPH (benign prostatic hyperplasia)   . DM (diabetes mellitus) ([place])   . Hyperlipidemia   . Hypertension   . Hypothyroidism   . Osteoporosis   . Pelvic fracture ([place])   . TIA (transient ischemic attack) 1998, 2014    PAST SURGICAL HISTORY: Past Surgical History:  Procedure Laterality Date  . bilateral hip replacement Bilateral 1998  . Left knee replacement Left 2007  . LUMBAR LAMINECTOMY  2010  . right hip revision Right 2008    SOCIAL HISTORY:  Social History  Substance Use Topics  . Smoking status: Never Smoker  .  Smokeless tobacco: Never Used  . Alcohol use No    FAMILY HISTORY:  Family History  Problem Relation Age of Onset  . CVA Father 85  . Prostate cancer Neg Hx   . Kidney cancer Neg Hx   . Bladder Cancer Neg Hx     DRUG ALLERGIES: No Known Allergies  REVIEW OF SYSTEMS:   CONSTITUTIONAL: No fever, fatigue or weakness.  EYES: No blurred or double vision.  EARS, NOSE, AND THROAT: No tinnitus or ear pain.  RESPIRATORY: No cough, shortness of breath, wheezing or hemoptysis.  CARDIOVASCULAR: No chest pain, orthopnea, edema.  GASTROINTESTINAL: No nausea, vomiting, diarrhea or abdominal pain.  GENITOURINARY: No dysuria, hematuria.  ENDOCRINE: No polyuria, nocturia,  HEMATOLOGY: No anemia, easy bruising or bleeding SKIN: No rash or lesion. MUSCULOSKELETAL: No joint pain or arthritis.   NEUROLOGIC: No tingling, numbness, weakness.  PSYCHIATRY: No anxiety or depression.   MEDICATIONS AT HOME:  Prior to Admission medications   Medication Sig Start Date End Date Taking? Authorizing Provider  ACCU-CHEK SOFTCLIX LANCETS lancets Use as instructed twice daily 12/16/15  Yes Historical Provider, MD  albuterol (PROVENTIL HFA;VENTOLIN HFA) 108 (90 Base) MCG/ACT inhaler Inhale into the lungs. 04/17/16 04/17/17 Yes Historical Provider, MD  aspirin EC 81 MG tablet Take 81 mg by mouth daily.   Yes Historical Provider, MD  calcium-vitamin D (OSCAL WITH D) 500-200 MG-UNIT tablet Take 1 tablet by mouth daily.   Yes Historical Provider, MD  clopidogrel (PLAVIX) 75 MG tablet Take 75 mg  by mouth daily. 07/11/15  Yes Historical Provider, MD  feeding supplement, GLUCERNA SHAKE, (GLUCERNA SHAKE) LIQD Take 237 mLs by mouth 2 (two) times daily between meals. 05/19/16  Yes Theodoro Grist, MD  finasteride (PROSCAR) 5 MG tablet Take 5 mg by mouth daily.   Yes Historical Provider, MD  gabapentin (NEURONTIN) 300 MG capsule Take by mouth. 07/10/16 07/10/17 Yes Historical Provider, MD  LANTUS SOLOSTAR 100 UNIT/ML Solostar Pen  Inject 18 Units into the skin every morning.  06/13/15  Yes Historical Provider, MD  levothyroxine (SYNTHROID, LEVOTHROID) 25 MCG tablet Take 1 tablet by mouth daily. 04/26/16  Yes Historical Provider, MD  lisinopril (PRINIVIL,ZESTRIL) 20 MG tablet Take 10 mg by mouth daily.  07/11/15  Yes Historical Provider, MD  metFORMIN (GLUCOPHAGE) 500 MG tablet Take 1,000 mg by mouth 2 (two) times daily.  08/12/15  Yes Historical Provider, MD  polyethylene glycol (MIRALAX / GLYCOLAX) packet Take 17 g by mouth daily. 09/12/15  Yes Richard Leslye Peer, MD  senna-docusate (SENOKOT-S) 8.6-50 MG tablet Take 1 tablet by mouth at bedtime as needed for mild constipation. 09/12/15  Yes Loletha Grayer, MD  tamsulosin (FLOMAX) 0.4 MG CAPS capsule Take 0.4 mg by mouth 2 (two) times daily. 08/08/15  Yes Historical Provider, MD  dextromethorphan (DELSYM) 30 MG/5ML liquid Take 5 mLs (30 mg total) by mouth 2 (two) times daily. Patient not taking: Reported on 07/12/2016 05/19/16   Theodoro Grist, MD  HYDROcodone-acetaminophen (NORCO/VICODIN) 5-325 MG tablet Take 1 tablet by mouth every 4 (four) hours as needed for moderate pain. Patient not taking: Reported on 07/12/2016 06/12/16   Lavonia Drafts, MD  hydrocortisone (ANUSOL-HC) 25 MG suppository Place rectally.    Historical Provider, MD  insulin aspart (NOVOLOG) 100 UNIT/ML FlexPen Inject 5 Units into the skin 3 (three) times daily with meals. Patient not taking: Reported on 07/12/2016 05/19/16   Theodoro Grist, MD  meclizine (ANTIVERT) 25 MG tablet Take 1 tablet (25 mg total) by mouth 3 (three) times daily as needed. Patient not taking: Reported on 07/12/2016 05/30/16   Merlyn Lot, MD  pioglitazone (ACTOS) 15 MG tablet Take by mouth. 07/09/16 07/09/17  Historical Provider, MD  simvastatin (ZOCOR) 20 MG tablet Take 20 mg by mouth at bedtime.  07/11/15   Historical Provider, MD  Teriparatide, Recombinant, 600 MCG/2.4ML SOLN Inject into the skin. 07/09/16 07/09/17  Historical Provider, MD       PHYSICAL EXAMINATION:   VITAL SIGNS: Blood pressure (!) 153/90, pulse (!) 50, temperature 97.6 F (36.4 C), resp. rate 19, height 5\' 9"  (1.753 m), weight 70.9 kg (156 lb 3.2 oz), SpO2 92 %.  GENERAL:  83 y.o.-year-old patient lying in the bed with no acute distress.  EYES: Pupils equal, round, reactive to light and accommodation. No scleral icterus. Extraocular muscles intact.  HEENT: Head atraumatic, normocephalic. Oropharynx and nasopharynx clear.  NECK:  Supple, no jugular venous distention. No thyroid enlargement, no tenderness.  LUNGS: Normal breath sounds bilaterally, no wheezing, rales,rhonchi or crepitation. No use of accessory muscles of respiration.  CARDIOVASCULAR: S1, S2 normal. No murmurs, rubs, or gallops.  ABDOMEN: Soft, nontender, nondistended. Bowel sounds present. No organomegaly or mass.  EXTREMITIES: No pedal edema, cyanosis, or clubbing.  NEUROLOGIC: Cranial nerves II through XII are intact. Muscle strength 5/5 in all extremities. Sensation intact. Gait not checked.  PSYCHIATRIC: The patient is alert and oriented x 3.  SKIN: No obvious rash, lesion, or ulcer.   LABORATORY PANEL:   CBC  Recent Labs Lab 07/16/16 0404  WBC  4.4  HGB 11.7*  HCT 33.8*  PLT 216  MCV 94.3  MCH 32.6  MCHC 34.6  RDW 13.8   ------------------------------------------------------------------------------------------------------------------  Chemistries   Recent Labs Lab 07/16/16 0404  NA 131*  K 3.8  CL 94*  CO2 31  GLUCOSE 115*  BUN 12  CREATININE 0.69  CALCIUM 8.9  AST 22  ALT 12*  ALKPHOS 106  BILITOT 0.3   ------------------------------------------------------------------------------------------------------------------ estimated creatinine clearance is 68.7 mL/min (by C-G formula based on SCr of 0.69 mg/dL). ------------------------------------------------------------------------------------------------------------------ No results for input(s): TSH, T4TOTAL,  T3FREE, THYROIDAB in the last 72 hours.  Invalid input(s): FREET3   Coagulation profile No results for input(s): INR, PROTIME in the last 168 hours. ------------------------------------------------------------------------------------------------------------------- No results for input(s): DDIMER in the last 72 hours. -------------------------------------------------------------------------------------------------------------------  Cardiac Enzymes  Recent Labs Lab 07/16/16 0404  TROPONINI <0.03   ------------------------------------------------------------------------------------------------------------------ Invalid input(s): POCBNP  ---------------------------------------------------------------------------------------------------------------  Urinalysis    Component Value Date/Time   COLORURINE STRAW (A) 07/16/2016 0404   APPEARANCEUR CLEAR (A) 07/16/2016 0404   APPEARANCEUR Clear 07/12/2016 1458   LABSPEC 1.008 07/16/2016 0404   PHURINE 8.0 07/16/2016 0404   GLUCOSEU NEGATIVE 07/16/2016 0404   HGBUR NEGATIVE 07/16/2016 0404   BILIRUBINUR NEGATIVE 07/16/2016 0404   BILIRUBINUR Negative 07/12/2016 1458   KETONESUR NEGATIVE 07/16/2016 0404   PROTEINUR NEGATIVE 07/16/2016 0404   NITRITE NEGATIVE 07/16/2016 0404   LEUKOCYTESUR NEGATIVE 07/16/2016 0404   LEUKOCYTESUR Negative 07/12/2016 1458     RADIOLOGY: Ct Head Wo Contrast  Result Date: 07/16/2016 CLINICAL DATA:  Altered mental status EXAM: CT HEAD WITHOUT CONTRAST TECHNIQUE: Contiguous axial images were obtained from the base of the skull through the vertex without intravenous contrast. COMPARISON:  Head CT 05/30/2016 FINDINGS: Brain: No mass lesion, intraparenchymal hemorrhage or extra-axial collection. No evidence of acute cortical infarct. There are multiple bilateral basal ganglia lacunar infarcts, unchanged. There is periventricular hypoattenuation compatible with chronic microvascular disease. Vascular: No  hyperdense vessel or unexpected calcification. Skull: Normal visualized skull base, calvarium and extracranial soft tissues. Sinuses/Orbits: No sinus fluid levels or advanced mucosal thickening. No mastoid effusion. Normal orbits. IMPRESSION: 1. No acute intracranial abnormality. 2. Advanced chronic microvascular ischemia and multiple old bilateral basal ganglia lacunar infarcts. Electronically Signed   By: Ulyses Jarred M.D.   On: 07/16/2016 04:46   Dg Chest Port 1 View  Result Date: 07/16/2016 CLINICAL DATA:  Confusion EXAM: PORTABLE CHEST 1 VIEW COMPARISON:  Chest radiograph 05/30/2016 FINDINGS: The heart size and mediastinal contours are within normal limits, allowing for AP technique. Both lungs are clear. The visualized skeletal structures are unremarkable. IMPRESSION: No active disease. Electronically Signed   By: Ulyses Jarred M.D.   On: 07/16/2016 04:40    EKG: Orders placed or performed during the hospital encounter of 05/30/16  . ED EKG  . ED EKG  . EKG 12-Lead  . EKG 12-Lead    IMPRESSION AND PLAN: 83 year old male patient with history of TIA, hypertension, hyperlipidemia, osteoporosis presented to the emergency room with difficulty in expressing words. Admitting diagnosis 1. Transient ischemic attack 2. Hypertension 3. Hyperlipidemia 4. Hyponatremia Treatment plan Admit patient to medical floor Neuro check every 4 hourly for 24 hours Start patient on oral aspirin Control blood pressure Neurology consultation MRI, MRA brain to rule out CVA Check echocardiogram Carotid ultrasound to rule out obstruction Supportive care.  All the records are reviewed and case discussed with ED provider. Management plans discussed with the patient, family and they are in agreement.  CODE STATUS:FULL CODE  Surrogate decision maker : spouse Code Status History    Date Active Date Inactive Code Status Order ID Comments User Context   05/18/2016  3:57 AM 05/19/2016  2:38 PM Full Code [id number]   Harvie Bridge, DO Inpatient   09/10/2015  4:04 PM 09/12/2015  7:46 PM Full Code [id number]  Nicholes Mango, MD Inpatient    Advance Directive Documentation   Flowsheet Row Most Recent Value  Type of Advance Directive  Healthcare Power of Attorney  Pre-existing out of facility DNR order (yellow form or pink MOST form)  No data  "MOST" Form in Place?  No data       TOTAL TIME TAKING CARE OF THIS PATIENT: 50 minutes.    Saundra Shelling M.D on 07/16/2016 at 5:49 AM  Between 7am to 6pm - Pager - [phone]  After 6pm go to [email or web address] - password [email or web address] [facility]  Office  [phone]  CC: Primary care physician; BABAOFF, Caryl Bis, MD

## 2016-07-16 NOTE — Progress Notes (Addendum)
[facility] [place] NAME: Brian Barry    MR#:  [id number]  DATE OF BIRTH:  July 28, 1929  SUBJECTIVE:  Patient was brought in secondary to slurred speech and some confusion cleared now doing well. No symptoms  REVIEW OF SYSTEMS:   Review of Systems  Constitutional: Negative for chills, fever and weight loss.  HENT: Negative for ear discharge, ear pain and nosebleeds.   Eyes: Negative for blurred vision, pain and discharge.  Respiratory: Negative for sputum production, shortness of breath, wheezing and stridor.   Cardiovascular: Negative for chest pain, palpitations, orthopnea and PND.  Gastrointestinal: Negative for abdominal pain, diarrhea, nausea and vomiting.  Genitourinary: Negative for frequency and urgency.  Musculoskeletal: Negative for back pain and joint pain.  Neurological: Negative for sensory change, speech change and focal weakness.  Psychiatric/Behavioral: Negative for depression and hallucinations. The patient is not nervous/anxious.    Nutrition:  Tolerating [id number] Tolerating diet: yes  DRUG ALLERGIES:  No Known Allergies  VITALS:  Blood pressure (!) 149/62, pulse (!) 59, temperature 97.1 F (36.2 C), temperature source Axillary, resp. rate 20, height 5\' 9"  (1.753 m), weight 68.5 kg (151 lb), SpO2 98 %.  PHYSICAL EXAMINATION:  GENERAL:  83 y.o.-year-old patient lying in the bed with no acute distress.  EYES: Pupils equal, round, reactive to light and accommodation. No scleral icterus. Extraocular muscles intact.  HEENT: Head atraumatic, normocephalic. Oropharynx and nasopharynx clear.  NECK:  Supple, no jugular venous distention. No thyroid enlargement, no tenderness.  LUNGS: Normal breath sounds bilaterally, no wheezing, rales,rhonchi or crepitation. No use of accessory muscles of respiration.  CARDIOVASCULAR: S1, S2 normal. No murmurs, rubs, or gallops.  ABDOMEN: Soft, nontender, nondistended. Bowel  sounds present. No organomegaly or mass.  EXTREMITIES: No pedal edema, cyanosis, or clubbing.  NEUROLOGIC: Cranial nerves II through XII are intact. Muscle strength 5/5 in all extremities. Sensation intact. Gait not checked.  PSYCHIATRIC: The patient is alert and oriented x 3.  SKIN: No obvious rash, lesion, or ulcer.    LABORATORY PANEL:   CBC  Recent Labs Lab 07/16/16 0404  WBC 4.4  HGB 11.7*  HCT 33.8*  PLT 216   ------------------------------------------------------------------------------------------------------------------  Chemistries   Recent Labs Lab 07/16/16 0404  NA 131*  K 3.8  CL 94*  CO2 31  GLUCOSE 115*  BUN 12  CREATININE 0.69  CALCIUM 8.9  AST 22  ALT 12*  ALKPHOS 106  BILITOT 0.3   ------------------------------------------------------------------------------------------------------------------  Cardiac Enzymes  Recent Labs Lab 07/16/16 0404  TROPONINI <0.03   ------------------------------------------------------------------------------------------------------------------  RADIOLOGY:  Ct Head Wo Contrast  Result Date: 07/16/2016 CLINICAL DATA:  Altered mental status EXAM: CT HEAD WITHOUT CONTRAST TECHNIQUE: Contiguous axial images were obtained from the base of the skull through the vertex without intravenous contrast. COMPARISON:  Head CT 05/30/2016 FINDINGS: Brain: No mass lesion, intraparenchymal hemorrhage or extra-axial collection. No evidence of acute cortical infarct. There are multiple bilateral basal ganglia lacunar infarcts, unchanged. There is periventricular hypoattenuation compatible with chronic microvascular disease. Vascular: No hyperdense vessel or unexpected calcification. Skull: Normal visualized skull base, calvarium and extracranial soft tissues. Sinuses/Orbits: No sinus fluid levels or advanced mucosal thickening. No mastoid effusion. Normal orbits. IMPRESSION: 1. No acute intracranial abnormality. 2. Advanced chronic  microvascular ischemia and multiple old bilateral basal ganglia lacunar infarcts. Electronically Signed   By: Ulyses Jarred M.D.   On: 07/16/2016 04:46   Mr Brain Wo Contrast  Result Date: 07/16/2016 CLINICAL DATA:  New  onset difficulty with speech beginning today. Difficulty expressing words since 3 a.m. Recent discharge from the rehabilitation facility. EXAM: MRI HEAD WITHOUT CONTRAST MRA HEAD WITHOUT CONTRAST TECHNIQUE: Multiplanar, multiecho pulse sequences of the brain and surrounding structures were obtained without intravenous contrast. Angiographic images of the head were obtained using MRA technique without contrast. COMPARISON:  CT without contrast from the same day. FINDINGS: MRI HEAD FINDINGS Brain: Remote lacunar infarcts are present within the basal ganglia bilaterally. Advanced atrophy and white matter changes are present bilaterally. A remote infarct is present in the anterior right frontal lobe. Extensive subcortical white matter changes associated with this remote infarct. Ventricles are proportionate to the degree of atrophy. No significant extra-axial fluid collection is present. There are remote lacunar infarcts in the midbrain. White matter changes extend into the brainstem. The the cerebellum is unremarkable. Vascular: Flow is present in the major intracranial arteries. Skull and upper cervical spine: Skullbase is within normal limits. Midline sagittal structures are unremarkable. Marrow signal is normal. The craniocervical junction is within normal limits. Sinuses/Orbits: The paranasal sinuses and mastoid air cells are clear. Bilateral lens replacements are present. The globes and orbits are intact. MRA HEAD FINDINGS The internal carotid arteries demonstrate atherosclerotic changes at the cavernous internal carotid arteries bilaterally without significant stenosis. There is no focal stenosis through the ICA termini bilaterally. The A1 and M1 segments are normal. The MCA bifurcations are  intact. There is some attenuation of distal MCA branch vessels bilaterally without a significant proximal stenosis or occlusion. The left vertebral artery is slightly dominant to the right. The left PICA origin is visualized and normal. The right AICA is dominant. Basilar artery is normal. Both posterior cerebral arteries originate the basilar tip. PCA branch vessels are within normal limits. IMPRESSION: 1. No acute intracranial abnormality. 2. Remote lacunar infarcts of the basal ganglia bilaterally. 3. 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 4. Mild distal small vessel disease without significant proximal stenosis, aneurysm, or branch vessel occlusion within the circle of Willis. Electronically Signed   By: San Morelle M.D.   On: 07/16/2016 12:45   US Carotid Bilateral (at [facility] Only)  Result Date: 07/16/2016 CLINICAL DATA:  TIA. EXAM: BILATERAL CAROTID DUPLEX ULTRASOUND TECHNIQUE: Pearline Cables scale imaging, color Doppler and duplex ultrasound were performed of bilateral carotid and vertebral arteries in the neck. COMPARISON:  09/23/2012 FINDINGS: Criteria: Quantification of carotid stenosis is based on velocity parameters that correlate the residual internal carotid diameter with NASCET-based stenosis levels, using the diameter of the distal internal carotid lumen as the denominator for stenosis measurement. The following velocity measurements were obtained: RIGHT ICA:  90 cm/sec CCA:  76 cm/sec SYSTOLIC ICA/CCA RATIO:  1.2 DIASTOLIC ICA/CCA RATIO:  1.6 ECA:  88 cm/sec LEFT ICA:  90 cm/sec CCA:  79 cm/sec SYSTOLIC ICA/CCA RATIO:  1.1 DIASTOLIC ICA/CCA RATIO:  2.2 ECA:  100 cm/sec RIGHT CAROTID ARTERY: Small amount of echogenic plaque at the right carotid bulb without significant stenosis. External carotid artery is patent with normal waveform. Minimal plaque in the proximal internal carotid artery. Normal waveforms and velocities in the internal carotid artery. RIGHT VERTEBRAL  ARTERY: Antegrade flow and normal waveform in the right vertebral artery. LEFT CAROTID ARTERY: Small amount of echogenic plaque at the left carotid bulb. External carotid artery is patent with normal waveform. Small amount of plaque in the proximal internal carotid artery. Normal waveforms and velocities in the internal carotid artery. LEFT VERTEBRAL ARTERY: Antegrade flow and normal waveform in the  left vertebral artery. IMPRESSION: Mild atherosclerotic disease in the carotid arteries. Estimated degree of stenosis in the internal carotid arteries is less than 50% bilaterally. Patent vertebral arteries with antegrade flow. Electronically Signed   By: Markus Daft M.D.   On: 07/16/2016 13:04   Dg Chest Port 1 View  Result Date: 07/16/2016 CLINICAL DATA:  Confusion EXAM: PORTABLE CHEST 1 VIEW COMPARISON:  Chest radiograph 05/30/2016 FINDINGS: The heart size and mediastinal contours are within normal limits, allowing for AP technique. Both lungs are clear. The visualized skeletal structures are unremarkable. IMPRESSION: No active disease. Electronically Signed   By: Ulyses Jarred M.D.   On: 07/16/2016 04:40   Mr Jodene Nam Head/brain [id number] Cm  Result Date: 07/16/2016 CLINICAL DATA:  New onset difficulty with speech beginning today. Difficulty expressing words since 3 a.m. Recent discharge from the rehabilitation facility. EXAM: MRI HEAD WITHOUT CONTRAST MRA HEAD WITHOUT CONTRAST TECHNIQUE: Multiplanar, multiecho pulse sequences of the brain and surrounding structures were obtained without intravenous contrast. Angiographic images of the head were obtained using MRA technique without contrast. COMPARISON:  CT without contrast from the same day. FINDINGS: MRI HEAD FINDINGS Brain: Remote lacunar infarcts are present within the basal ganglia bilaterally. Advanced atrophy and white matter changes are present bilaterally. A remote infarct is present in the anterior right frontal lobe. Extensive subcortical white matter changes  associated with this remote infarct. Ventricles are proportionate to the degree of atrophy. No significant extra-axial fluid collection is present. There are remote lacunar infarcts in the midbrain. White matter changes extend into the brainstem. The the cerebellum is unremarkable. Vascular: Flow is present in the major intracranial arteries. Skull and upper cervical spine: Skullbase is within normal limits. Midline sagittal structures are unremarkable. Marrow signal is normal. The craniocervical junction is within normal limits. Sinuses/Orbits: The paranasal sinuses and mastoid air cells are clear. Bilateral lens replacements are present. The globes and orbits are intact. MRA HEAD FINDINGS The internal carotid arteries demonstrate atherosclerotic changes at the cavernous internal carotid arteries bilaterally without significant stenosis. There is no focal stenosis through the ICA termini bilaterally. The A1 and M1 segments are normal. The MCA bifurcations are intact. There is some attenuation of distal MCA branch vessels bilaterally without a significant proximal stenosis or occlusion. The left vertebral artery is slightly dominant to the right. The left PICA origin is visualized and normal. The right AICA is dominant. Basilar artery is normal. Both posterior cerebral arteries originate the basilar tip. PCA branch vessels are within normal limits. IMPRESSION: 1. No acute intracranial abnormality. 2. Remote lacunar infarcts of the basal ganglia bilaterally. 3. 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 4. Mild distal small vessel disease without significant proximal stenosis, aneurysm, or branch vessel occlusion within the circle of Willis. Electronically Signed   By: San Morelle M.D.   On: 07/16/2016 12:45   ASSESSMENT AND PLAN:   Andric Hotz  is a 83 y.o. male with a known history of Arthritis, benign prostate never atrophic, transient ischemic attack, hypertension,  hyperlipidemia, hypothyroidism presented to the emergency room with difficulty in speech is started this morning. Patient woke up around 3 AM this morning and had trouble expressing words. He could not speak. Patient's family were concerned and they called the EMS and brought the patient to the emergency room  1. Transient ischemic attack Patient's symptoms have cleared. Speech is clear. He has no focal weakness. MRI of the brain negative for acute stroke shows old lacunar infarct. Carotid no significant stenosis.  Echo results pending. EEG done as recommended by the neurology shows no epileptiform activity. -PT to see patient.  2. Hypertension -Continue home meds  3. Hyperlipidemia -Continue statin 4. Hyponatremia -Improved  5. Insomnia according to wife. Patient is recommended to follow up with primary care physician regarding the same. This could be contributing to his confusion and cognitive decline  6. Per Patient's daughter sleep study has been ordered but have a care physician which is highly recommended.  All the records are reviewed and case discussed with Care Management/Social Workerr. Management plans discussed with the patient, family and they are in agreement.  CODE STATUS: Full TOTAL TIME TAKING CARE OF THIS PATIENT: 32minutes.   POSSIBLE D/C IN *1 DAYS, DEPENDING ON CLINICAL CONDITION.   Dariana Garbett M.D on 07/16/2016 at 3:49 PM  Between 7am to 6pm - Pager - [phone]  After 6pm go to [email or web address] - password [email or web address] [facility]  Office  [phone]  CC: Primary care physician; BABAOFF, Caryl Bis, MD

## 2016-07-16 NOTE — ED Notes (Signed)
ED Provider at bedside. 

## 2016-07-17 DIAGNOSIS — E871 Hypo-osmolality and hyponatremia: Secondary | ICD-10-CM | POA: Diagnosis not present

## 2016-07-17 DIAGNOSIS — E785 Hyperlipidemia, unspecified: Secondary | ICD-10-CM | POA: Diagnosis not present

## 2016-07-17 DIAGNOSIS — G459 Transient cerebral ischemic attack, unspecified: Secondary | ICD-10-CM | POA: Diagnosis not present

## 2016-07-17 DIAGNOSIS — I10 Essential (primary) hypertension: Secondary | ICD-10-CM | POA: Diagnosis not present

## 2016-07-17 LAB — LIPID PANEL
Cholesterol: 157 mg/dL (ref 0–200)
HDL: 40 mg/dL — ABNORMAL LOW (ref >40)
LDL CALC: 96 mg/dL (ref 0–99)
TRIGLYCERIDES: 104 mg/dL (ref <150)
Total CHOL/HDL Ratio: 3.9 RATIO
VLDL: 21 mg/dL (ref 0–40)

## 2016-07-17 LAB — HEMOGLOBIN A1C
Hgb A1c MFr Bld: 7.8 % — ABNORMAL HIGH (ref 4.8–5.6)
MEAN PLASMA GLUCOSE: 177 mg/dL

## 2016-07-17 LAB — GLUCOSE, CAPILLARY
GLUCOSE-CAPILLARY: 114 mg/dL — AB (ref 65–99)
Glucose-Capillary: 272 mg/dL — ABNORMAL HIGH (ref 65–99)

## 2016-07-17 NOTE — Discharge Summary (Signed)
[place] [facility] [place] [facility] NAME: Brian Barry    MR#:  [id number]  DATE OF BIRTH:  1929/08/11  DATE OF ADMISSION:  07/16/2016 ADMITTING PHYSICIAN: Saundra Shelling, MD  DATE OF DISCHARGE: 07/17/2016  PRIMARY CARE PHYSICIAN: BABAOFF, MARC E, MD    ADMISSION DIAGNOSIS:  Confusion [R41.0] Transient cerebral ischemia, unspecified type [G45.9]  DISCHARGE DIAGNOSIS:   TIA  SECONDARY DIAGNOSIS:   Past Medical History:  Diagnosis Date  . Arthritis   . BPH (benign prostatic hyperplasia)   . DM (diabetes mellitus) ([place])   . Hyperlipidemia   . Hypertension   . Hypothyroidism   . Osteoporosis   . Pelvic fracture ([place])   . TIA (transient ischemic attack) 1998, 2014    HOSPITAL COURSE:   [name] a 83 y.o.malewith a known history of Arthritis, benign prostate never atrophic, transient ischemic attack, hypertension, hyperlipidemia, hypothyroidism presented to the emergency room with difficulty in speech is started this morning.Patient woke up around 3 AM this morning and had trouble expressing words. He could not speak. Patient's family were concerned and they called the EMS and brought the patient to the emergency room  1.Transient ischemic attack Patient's symptoms have cleared. Speech is clear. He has no focal weakness. MRI of the brain negative for acute stroke shows old lacunar infarct. Carotid no significant stenosis. Echo looks ok. EEG done as recommended by the neurology shows no epileptiform activity. -PT recommends resume PT  2.Hypertension -Continue home meds  3.Hyperlipidemia -Continue statin 4.Hyponatremia -Improved  5. Insomnia according to wife. Patient is recommended to follow up with primary care physician regarding the same. This could be contributing to his confusion and cognitive decline -consider outpt SLEEP study. Will defer to Dr Baldemar Lenis for it.  D/w dter and wife  CONSULTS OBTAINED:   Treatment Team:  Alexis Goodell, MD  DRUG ALLERGIES:  No Known Allergies  DISCHARGE MEDICATIONS:   Current Discharge Medication List    CONTINUE these medications which have NOT CHANGED   Details  ACCU-CHEK SOFTCLIX LANCETS lancets Use as instructed twice daily    aspirin EC 81 MG tablet Take 81 mg by mouth daily.    calcium-vitamin D (OSCAL WITH D) 500-200 MG-UNIT tablet Take 1 tablet by mouth daily.    clopidogrel (PLAVIX) 75 MG tablet Take 75 mg by mouth daily.    feeding supplement, GLUCERNA SHAKE, (GLUCERNA SHAKE) LIQD Take 237 mLs by mouth 2 (two) times daily between meals. Qty: 60 Can, Refills: 5    finasteride (PROSCAR) 5 MG tablet Take 5 mg by mouth daily.    gabapentin (NEURONTIN) 300 MG capsule Take by mouth.    LANTUS SOLOSTAR 100 UNIT/ML Solostar Pen Inject 18 Units into the skin every morning.     levothyroxine (SYNTHROID, LEVOTHROID) 25 MCG tablet Take 1 tablet by mouth daily.    lisinopril (PRINIVIL,ZESTRIL) 20 MG tablet Take 10 mg by mouth daily.     metFORMIN (GLUCOPHAGE) 500 MG tablet Take 1,000 mg by mouth 2 (two) times daily.     polyethylene glycol (MIRALAX / GLYCOLAX) packet Take 17 g by mouth daily. Qty: 14 each, Refills: 0    senna-docusate (SENOKOT-S) 8.6-50 MG tablet Take 1 tablet by mouth at bedtime as needed for mild constipation. Qty: 30 tablet, Refills: 0    tamsulosin (FLOMAX) 0.4 MG CAPS capsule Take 0.4 mg by mouth 2 (two) times daily.    dextromethorphan (DELSYM) 30 MG/5ML liquid Take 5 mLs (30 mg total) by mouth 2 (  two) times daily. Qty: 89 mL, Refills: 0    hydrocortisone (ANUSOL-HC) 25 MG suppository Place rectally.    insulin aspart (NOVOLOG) 100 UNIT/ML FlexPen Inject 5 Units into the skin 3 (three) times daily with meals. Qty: 15 mL, Refills: 11    meclizine (ANTIVERT) 25 MG tablet Take 1 tablet (25 mg total) by mouth 3 (three) times daily as needed. Qty: 6 tablet, Refills: 0    pioglitazone (ACTOS) 15 MG tablet Take by  mouth.    simvastatin (ZOCOR) 20 MG tablet Take 20 mg by mouth at bedtime.     Teriparatide, Recombinant, 600 MCG/2.4ML SOLN Inject into the skin.      STOP taking these medications     HYDROcodone-acetaminophen (NORCO/VICODIN) 5-325 MG tablet         If you experience worsening of your admission symptoms, develop shortness of breath, life threatening emergency, suicidal or homicidal thoughts you must seek medical attention immediately by calling 911 or calling your MD immediately  if symptoms less severe.  You Must read complete instructions/literature along with all the possible adverse reactions/side effects for all the Medicines you take and that have been prescribed to you. Take any new Medicines after you have completely understood and accept all the possible adverse reactions/side effects.   Please note  You were cared for by a hospitalist during your hospital stay. If you have any questions about your discharge medications or the care you received while you were in the hospital after you are discharged, you can call the unit and asked to speak with the hospitalist on call if the hospitalist that took care of you is not available. Once you are discharged, your primary care physician will handle any further medical issues. Please note that NO REFILLS for any discharge medications will be authorized once you are discharged, as it is imperative that you return to your primary care physician (or establish a relationship with a primary care physician if you do not have one) for your aftercare needs so that they can reassess your need for medications and monitor your lab values. Today   SUBJECTIVE   Feels better. Slept intermittently but had better sleep  VITAL SIGNS:  Blood pressure 140/61, pulse (!) 54, temperature 97.4 F (36.3 C), temperature source Oral, resp. rate 20, height 5\' 9"  (1.753 m), weight 68.5 kg (151 lb), SpO2 96 %.  I/O:   Intake/Output Summary (Last 24 hours) at  07/17/16 1024 Last data filed at 07/17/16 0800  Gross per 24 hour  Intake              480 ml  Output                0 ml  Net              480 ml    PHYSICAL EXAMINATION:  GENERAL:  83 y.o.-year-old patient lying in the bed with no acute distress.  EYES: Pupils equal, round, reactive to light and accommodation. No scleral icterus. Extraocular muscles intact.  HEENT: Head atraumatic, normocephalic. Oropharynx and nasopharynx clear.  NECK:  Supple, no jugular venous distention. No thyroid enlargement, no tenderness.  LUNGS: Normal breath sounds bilaterally, no wheezing, rales,rhonchi or crepitation. No use of accessory muscles of respiration.  CARDIOVASCULAR: S1, S2 normal. No murmurs, rubs, or gallops.  ABDOMEN: Soft, non-tender, non-distended. Bowel sounds present. No organomegaly or mass.  EXTREMITIES: No pedal edema, cyanosis, or clubbing.  NEUROLOGIC: Cranial nerves II through XII are intact. Muscle strength  5/5 in all extremities. Sensation intact. Gait not checked.  PSYCHIATRIC: The patient is alert and oriented x 3.  SKIN: No obvious rash, lesion, or ulcer.   DATA REVIEW:   CBC   Recent Labs Lab 07/16/16 0404  WBC 4.4  HGB 11.7*  HCT 33.8*  PLT 216    Chemistries   Recent Labs Lab 07/16/16 0404  NA 131*  K 3.8  CL 94*  CO2 31  GLUCOSE 115*  BUN 12  CREATININE 0.69  CALCIUM 8.9  AST 22  ALT 12*  ALKPHOS 106  BILITOT 0.3    Microbiology Results   Recent Results (from the past 240 hour(s))  Microscopic Examination     Status: None   Collection Time: 07/12/16  2:58 PM  Result Value Ref Range Status   WBC, UA None seen 0 - 5 /hpf Final   RBC, UA 0-2 0 - 2 /hpf Final   Epithelial Cells (non renal) None seen 0 - 10 /hpf Final   Bacteria, UA None seen None seen/Few Final    RADIOLOGY:  Ct Head Wo Contrast  Result Date: 07/16/2016 CLINICAL DATA:  Altered mental status EXAM: CT HEAD WITHOUT CONTRAST TECHNIQUE: Contiguous axial images were obtained from  the base of the skull through the vertex without intravenous contrast. COMPARISON:  Head CT 05/30/2016 FINDINGS: Brain: No mass lesion, intraparenchymal hemorrhage or extra-axial collection. No evidence of acute cortical infarct. There are multiple bilateral basal ganglia lacunar infarcts, unchanged. There is periventricular hypoattenuation compatible with chronic microvascular disease. Vascular: No hyperdense vessel or unexpected calcification. Skull: Normal visualized skull base, calvarium and extracranial soft tissues. Sinuses/Orbits: No sinus fluid levels or advanced mucosal thickening. No mastoid effusion. Normal orbits. IMPRESSION: 1. No acute intracranial abnormality. 2. Advanced chronic microvascular ischemia and multiple old bilateral basal ganglia lacunar infarcts. Electronically Signed   By: Ulyses Jarred M.D.   On: 07/16/2016 04:46   Mr Brain Wo Contrast  Result Date: 07/16/2016 CLINICAL DATA:  New onset difficulty with speech beginning today. Difficulty expressing words since 3 a.m. Recent discharge from the rehabilitation facility. EXAM: MRI HEAD WITHOUT CONTRAST MRA HEAD WITHOUT CONTRAST TECHNIQUE: Multiplanar, multiecho pulse sequences of the brain and surrounding structures were obtained without intravenous contrast. Angiographic images of the head were obtained using MRA technique without contrast. COMPARISON:  CT without contrast from the same day. FINDINGS: MRI HEAD FINDINGS Brain: Remote lacunar infarcts are present within the basal ganglia bilaterally. Advanced atrophy and white matter changes are present bilaterally. A remote infarct is present in the anterior right frontal lobe. Extensive subcortical white matter changes associated with this remote infarct. Ventricles are proportionate to the degree of atrophy. No significant extra-axial fluid collection is present. There are remote lacunar infarcts in the midbrain. White matter changes extend into the brainstem. The the cerebellum is  unremarkable. Vascular: Flow is present in the major intracranial arteries. Skull and upper cervical spine: Skullbase is within normal limits. Midline sagittal structures are unremarkable. Marrow signal is normal. The craniocervical junction is within normal limits. Sinuses/Orbits: The paranasal sinuses and mastoid air cells are clear. Bilateral lens replacements are present. The globes and orbits are intact. MRA HEAD FINDINGS The internal carotid arteries demonstrate atherosclerotic changes at the cavernous internal carotid arteries bilaterally without significant stenosis. There is no focal stenosis through the ICA termini bilaterally. The A1 and M1 segments are normal. The MCA bifurcations are intact. There is some attenuation of distal MCA branch vessels bilaterally without a significant proximal stenosis or occlusion.  The left vertebral artery is slightly dominant to the right. The left PICA origin is visualized and normal. The right AICA is dominant. Basilar artery is normal. Both posterior cerebral arteries originate the basilar tip. PCA branch vessels are within normal limits. IMPRESSION: 1. No acute intracranial abnormality. 2. Remote lacunar infarcts of the basal ganglia bilaterally. 3. 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 4. Mild distal small vessel disease without significant proximal stenosis, aneurysm, or branch vessel occlusion within the circle of Willis. Electronically Signed   By: San Morelle M.D.   On: 07/16/2016 12:45   US Carotid Bilateral (at [facility] Only)  Result Date: 07/16/2016 CLINICAL DATA:  TIA. EXAM: BILATERAL CAROTID DUPLEX ULTRASOUND TECHNIQUE: Pearline Cables scale imaging, color Doppler and duplex ultrasound were performed of bilateral carotid and vertebral arteries in the neck. COMPARISON:  09/23/2012 FINDINGS: Criteria: Quantification of carotid stenosis is based on velocity parameters that correlate the residual internal carotid diameter with  NASCET-based stenosis levels, using the diameter of the distal internal carotid lumen as the denominator for stenosis measurement. The following velocity measurements were obtained: RIGHT ICA:  90 cm/sec CCA:  76 cm/sec SYSTOLIC ICA/CCA RATIO:  1.2 DIASTOLIC ICA/CCA RATIO:  1.6 ECA:  88 cm/sec LEFT ICA:  90 cm/sec CCA:  79 cm/sec SYSTOLIC ICA/CCA RATIO:  1.1 DIASTOLIC ICA/CCA RATIO:  2.2 ECA:  100 cm/sec RIGHT CAROTID ARTERY: Small amount of echogenic plaque at the right carotid bulb without significant stenosis. External carotid artery is patent with normal waveform. Minimal plaque in the proximal internal carotid artery. Normal waveforms and velocities in the internal carotid artery. RIGHT VERTEBRAL ARTERY: Antegrade flow and normal waveform in the right vertebral artery. LEFT CAROTID ARTERY: Small amount of echogenic plaque at the left carotid bulb. External carotid artery is patent with normal waveform. Small amount of plaque in the proximal internal carotid artery. Normal waveforms and velocities in the internal carotid artery. LEFT VERTEBRAL ARTERY: Antegrade flow and normal waveform in the left vertebral artery. IMPRESSION: Mild atherosclerotic disease in the carotid arteries. Estimated degree of stenosis in the internal carotid arteries is less than 50% bilaterally. Patent vertebral arteries with antegrade flow. Electronically Signed   By: Markus Daft M.D.   On: 07/16/2016 13:04   Dg Chest Port 1 View  Result Date: 07/16/2016 CLINICAL DATA:  Confusion EXAM: PORTABLE CHEST 1 VIEW COMPARISON:  Chest radiograph 05/30/2016 FINDINGS: The heart size and mediastinal contours are within normal limits, allowing for AP technique. Both lungs are clear. The visualized skeletal structures are unremarkable. IMPRESSION: No active disease. Electronically Signed   By: Ulyses Jarred M.D.   On: 07/16/2016 04:40   Mr Jodene Nam Head/brain [id number] Cm  Result Date: 07/16/2016 CLINICAL DATA:  New onset difficulty with speech beginning  today. Difficulty expressing words since 3 a.m. Recent discharge from the rehabilitation facility. EXAM: MRI HEAD WITHOUT CONTRAST MRA HEAD WITHOUT CONTRAST TECHNIQUE: Multiplanar, multiecho pulse sequences of the brain and surrounding structures were obtained without intravenous contrast. Angiographic images of the head were obtained using MRA technique without contrast. COMPARISON:  CT without contrast from the same day. FINDINGS: MRI HEAD FINDINGS Brain: Remote lacunar infarcts are present within the basal ganglia bilaterally. Advanced atrophy and white matter changes are present bilaterally. A remote infarct is present in the anterior right frontal lobe. Extensive subcortical white matter changes associated with this remote infarct. Ventricles are proportionate to the degree of atrophy. No significant extra-axial fluid collection is present. There are remote lacunar infarcts in the  midbrain. White matter changes extend into the brainstem. The the cerebellum is unremarkable. Vascular: Flow is present in the major intracranial arteries. Skull and upper cervical spine: Skullbase is within normal limits. Midline sagittal structures are unremarkable. Marrow signal is normal. The craniocervical junction is within normal limits. Sinuses/Orbits: The paranasal sinuses and mastoid air cells are clear. Bilateral lens replacements are present. The globes and orbits are intact. MRA HEAD FINDINGS The internal carotid arteries demonstrate atherosclerotic changes at the cavernous internal carotid arteries bilaterally without significant stenosis. There is no focal stenosis through the ICA termini bilaterally. The A1 and M1 segments are normal. The MCA bifurcations are intact. There is some attenuation of distal MCA branch vessels bilaterally without a significant proximal stenosis or occlusion. The left vertebral artery is slightly dominant to the right. The left PICA origin is visualized and normal. The right AICA is dominant.  Basilar artery is normal. Both posterior cerebral arteries originate the basilar tip. PCA branch vessels are within normal limits. IMPRESSION: 1. No acute intracranial abnormality. 2. Remote lacunar infarcts of the basal ganglia bilaterally. 3. 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 4. Mild distal small vessel disease without significant proximal stenosis, aneurysm, or branch vessel occlusion within the circle of Willis. Electronically Signed   By: San Morelle M.D.   On: 07/16/2016 12:45     Management plans discussed with the patient, family and they are in agreement.  CODE STATUS:     Code Status Orders        Start     Ordered   07/16/16 0901  Full code  Continuous     07/16/16 0900    Code Status History    Date Active Date Inactive Code Status Order ID Comments User Context   05/18/2016  3:57 AM 05/19/2016  2:38 PM Full Code [id number]  Harvie Bridge, DO Inpatient   09/10/2015  4:04 PM 09/12/2015  7:46 PM Full Code [id number]  Nicholes Mango, MD Inpatient    Advance Directive Documentation   Flowsheet Row Most Recent Value  Type of Advance Directive  Living will, Out of facility DNR (pink MOST or yellow form)  Pre-existing out of facility DNR order (yellow form or pink MOST form)  No data  "MOST" Form in Place?  No data      TOTAL TIME TAKING CARE OF THIS PATIENT: 40 minutes.    Graysen Depaula M.D on 07/17/2016 at 10:24 AM  Between 7am to 6pm - Pager - [phone] After 6pm go to [email or web address] - password [email or web address]  Office  [phone]  CC: Primary care physician; BABAOFF, Caryl Bis, MD

## 2016-07-17 NOTE — Clinical Social Work Note (Signed)
Pt is ready for discharge today and will return home to [facility]. RNCM is following for discharge planning needs. CSW is signing off as no further needs identified.   Barbette Mcglaun, MSW, LCSW  Clinical Social Worker  [phone] 

## 2016-07-17 NOTE — Evaluation (Signed)
Physical Therapy Evaluation Patient Details Name: Brian Barry MRN: [id number] DOB: Sep 28, 1929 Today's Date: 07/17/2016   History of Present Illness  Pt is an 83 y.o. male presenting to hospital with AMS and difficulty speaking.  PMH includes nondisplaced fx's L inferior and superior pelvic ramus s/p fall 06/12/16 (went to [facility] for 2 weeks and has been home about 1 week); B THR, AAA, B THR, L knee replacement.  Clinical Impression  Prior to hospital admission, pt was supervision with ambulation using RW (since recent discharge from [facility]).  Pt lives with his wife at [place].  Currently pt is modified independent with bed mobility and CGA with transfers and ambulation around nursing loop with RW.  Pt demonstrating mild antalgic gait (pt with h/o recent fall with pelvic fx) and also appearing limited with higher level balance activities.  Pt would benefit from skilled PT to address noted impairments and functional limitations.  Recommend pt discharge home with HHPT and supervision for mobility (pt's wife already performs this) when medically appropriate.     Follow Up Recommendations Home health PT;Supervision/Assistance - 24 hour (pt's wife already provides 24/7 supervision/assist)    Equipment Recommendations  Rolling walker with 5" wheels (pt already owns RW)    Recommendations for Other Services       Precautions / Restrictions Precautions Precautions: Fall Restrictions Weight Bearing Restrictions: No      Mobility  Bed Mobility Overal bed mobility: Modified Independent             General bed mobility comments: Supine to/from sit with bed flat; increased time to perform  Transfers Overall transfer level: Needs assistance Equipment used: Rolling walker (2 wheeled) Transfers: Sit to/from Stand Sit to Stand: Min guard         General transfer comment: pt requiring vc's for hand placement intermittently to decrease difficulty with standing (pt's wife  able to give vc's well)  Ambulation/Gait Ambulation/Gait assistance: Min guard Ambulation Distance (Feet): 200 Feet Assistive device: Rolling walker (2 wheeled)   Gait velocity: mildly decreased   General Gait Details: mild decreased stance time L LE; mild antalgic gait; vc's to stay closer to RW with distance (anticipate d/t fatigue)  Stairs            Wheelchair Mobility    Modified Rankin (Stroke Patients Only)       Balance Overall balance assessment: Needs assistance Sitting-balance support: No upper extremity supported;Feet supported Sitting balance-Leahy Scale: Good Sitting balance - Comments: sitting edge of bed reaching within BOS   Standing balance support: Single extremity supported (on walker) Standing balance-Leahy Scale: Fair Standing balance comment: standing reaching within BOS (pt hesitant to reach outside BOS)                             Pertinent Vitals/Pain Pain Assessment: 0-10 Pain Score:  (0/10 at rest; 2/10 with ambulation) Pain Location: L pelvis Pain Descriptors / Indicators: Sore Pain Intervention(s): Limited activity within patient's tolerance;Monitored during session;Repositioned  Vitals (HR and O2 on room air) stable and WFL throughout treatment session.    Home Living Family/patient expects to be discharged to:: Other (Comment)                 Additional Comments: [place] with spouse    Prior Function Level of Independence: Needs assistance   Gait / Transfers Assistance Needed: Supervision with RW  ADL's / Homemaking Assistance Needed: Supervision  by wife for ADL's  Comments: Pt's wife performs 24/7 supervision; receiving HHPT and HHOT prior to hospital admission; no falls since fall end of January (when pt sustained pelvic fx's); pt's wife drives     Hand Dominance        Extremity/Trunk Assessment   Upper Extremity Assessment Upper Extremity Assessment: Overall WFL for tasks  assessed    Lower Extremity Assessment Lower Extremity Assessment: Generalized weakness       Communication   Communication: HOH  Cognition Arousal/Alertness: Awake/alert Behavior During Therapy: WFL for tasks assessed/performed Overall Cognitive Status: Within Functional Limits for tasks assessed                      General Comments General comments (skin integrity, edema, etc.): Pt's wife and family present during session.  Pt agreeable to PT session.    Exercises     Assessment/Plan    PT Assessment Patient needs continued PT services  PT Problem List Decreased strength;Decreased balance       PT Treatment Interventions DME instruction;Functional mobility training;Therapeutic activities;Therapeutic exercise;Balance training;Patient/family education;Gait training    PT Goals (Current goals can be found in the Care Plan section)  Acute Rehab PT Goals Patient Stated Goal: to go home PT Goal Formulation: With patient/family Time For Goal Achievement: 07/31/16 Potential to Achieve Goals: Good    Frequency Min 2X/week   Barriers to discharge        Co-evaluation               End of Session Equipment Utilized During Treatment: Gait belt Activity Tolerance: Patient tolerated treatment well Patient left: in bed;with call bell/phone within reach;with bed alarm set;with family/visitor present Nurse Communication: Mobility status;Precautions PT Visit Diagnosis: Other abnormalities of gait and mobility (R26.89);History of falling (Z91.81)    Functional Assessment Tool Used: AM-PAC 6 Clicks Basic Mobility Functional Limitation: Mobility: Walking and moving around Mobility: Walking and Moving Around Current Status [id number]): At least 20 percent but less than 40 percent impaired, limited or restricted Mobility: Walking and Moving Around Goal Status [phone]): At least 1 percent but less than 20 percent impaired, limited or restricted    Time: 1017-1040 PT  Time Calculation (min) (ACUTE ONLY): 23 min   Charges:   PT Evaluation $PT Eval Low Complexity: 1 Procedure     PT G Codes:   PT G-Codes **NOT FOR INPATIENT CLASS** Functional Assessment Tool Used: AM-PAC 6 Clicks Basic Mobility Functional Limitation: Mobility: Walking and moving around Mobility: Walking and Moving Around Current Status [id number]): At least 20 percent but less than 40 percent impaired, limited or restricted Mobility: Walking and Moving Around Goal Status [id number]): At least 1 percent but less than 20 percent impaired, limited or restricted     Leitha Bleak, PT 07/17/16, 11:55 AM [phone]

## 2016-07-17 NOTE — Progress Notes (Signed)
[place] coordinated a notary and two witnesses for completion of AD for Pt and Wife. CH made copies of the Pt AD and placed in his chart. Both originals were returned to Pt.    07/17/16 1300  Clinical Encounter Type  Visited With Patient;Patient and family together  Visit Type Initial;Spiritual support  Spiritual Encounters  Spiritual Needs Literature;Prayer

## 2016-07-17 NOTE — Progress Notes (Signed)
Discharge instructions given and went over with patient, wife, and daughter at bedside. All questions answered. Follow-up appointments reviewed. Patient discharged home with resumption of home care with family via wheelchair by nursing staff. Madlyn Frankel, RN

## 2016-07-17 NOTE — Care Management (Signed)
Discharge to home today per Dr. Posey Pronto. Will resume orders for occupational and physical therapy per [place]. Shelbie Ammons RN MSN CCM Care Management

## 2016-07-17 NOTE — Progress Notes (Signed)
OT Cancellation Note  Patient Details Name: TARIK VANNOTE MRN: [id number] DOB: 1930/02/16   Cancelled Treatment:    Reason Eval/Treat Not Completed: Patient declined, no reason specified.  Pt's family declined OT evaluation due to being Observation status and is going to be receiving HH OT at [facility] that he resides at.  Thank you for the referral.  Chrys Racer, OTR/L ascom [phone] 07/17/16, 10:15 AM

## 2016-07-17 NOTE — Care Management (Signed)
Admitted to [facility] with the diagnosis of TIA under observation status. Lives with wife, Brian Barry [id number]) at [facility] since November 17th 2017. Last seen Dr. Loney Hering 2 weks ago. Discharged from this facility 05/19/16 with services per [place]. They are still in the home. No skilled facility. No home oxygen. Bedside commode, rolling walker, rollayor, wheelchair, and grab bars in the home. Last fall was Feb 4th Good appetite. Takes care of all basic activities of daily living himself, doesn't drive. Wife does errands. Prescriptions are filled at [facility]. Family will transport.  Shelbie Ammons RN MSN CCM Care Management

## 2016-07-17 NOTE — Progress Notes (Signed)
Family Meeting Note  Advance Directive:YES Today a meeting took place with the pt and family   The following clinical team members were present during this meeting:MD, dter and wife The following were discussed:Patient's diagnosis: , Patient's progosis: Overall doing better. Admitted with TIA. Pt and family wishes to be DNR. Will respect his wishes and keep him DNR  Time spent during discussion: 16 mins  Judieth Mckown, MD

## 2016-07-17 NOTE — Care Management Obs Status (Signed)
[place] NOTIFICATION   Patient Details  Name: Brian Barry MRN: [id number] Date of Birth: 04/17/1930   Medicare Observation Status Notification Given:  Yes    Shelbie Ammons, RN 07/17/2016, 8:50 AM

## 2016-07-17 NOTE — Progress Notes (Signed)
While rounding, [place] made initial visit to room 103. Pt was in good spirits. Wife and daughter are bedside. After some conversation, wife asked if it were possible for her husband and herself to complete an AD. [place] agreed to make the arrangement. [place] educated Pt and wife on the AD. [place] will follow up at 12:15 PM for completion. [place] provided the ministry of education and prayer.    07/17/16 1300  Clinical Encounter Type  Visited With Patient;Patient and family together  Visit Type Initial;Spiritual support  Spiritual Encounters  Spiritual Needs Literature;Prayer

## 2016-07-18 DIAGNOSIS — E119 Type 2 diabetes mellitus without complications: Secondary | ICD-10-CM | POA: Diagnosis not present

## 2016-07-18 DIAGNOSIS — E039 Hypothyroidism, unspecified: Secondary | ICD-10-CM | POA: Diagnosis not present

## 2016-07-18 DIAGNOSIS — G9341 Metabolic encephalopathy: Secondary | ICD-10-CM | POA: Diagnosis not present

## 2016-07-18 DIAGNOSIS — Z96643 Presence of artificial hip joint, bilateral: Secondary | ICD-10-CM | POA: Diagnosis not present

## 2016-07-18 DIAGNOSIS — Z9181 History of falling: Secondary | ICD-10-CM | POA: Diagnosis not present

## 2016-07-18 DIAGNOSIS — E785 Hyperlipidemia, unspecified: Secondary | ICD-10-CM | POA: Diagnosis not present

## 2016-07-18 DIAGNOSIS — I10 Essential (primary) hypertension: Secondary | ICD-10-CM | POA: Diagnosis not present

## 2016-07-18 DIAGNOSIS — J181 Lobar pneumonia, unspecified organism: Secondary | ICD-10-CM | POA: Diagnosis not present

## 2016-07-18 DIAGNOSIS — Z8673 Personal history of transient ischemic attack (TIA), and cerebral infarction without residual deficits: Secondary | ICD-10-CM | POA: Diagnosis not present

## 2016-07-19 DIAGNOSIS — G4733 Obstructive sleep apnea (adult) (pediatric): Secondary | ICD-10-CM | POA: Diagnosis not present

## 2016-07-19 DIAGNOSIS — Z8673 Personal history of transient ischemic attack (TIA), and cerebral infarction without residual deficits: Secondary | ICD-10-CM | POA: Diagnosis not present

## 2016-07-19 DIAGNOSIS — Z96643 Presence of artificial hip joint, bilateral: Secondary | ICD-10-CM | POA: Diagnosis not present

## 2016-07-19 DIAGNOSIS — E119 Type 2 diabetes mellitus without complications: Secondary | ICD-10-CM | POA: Diagnosis not present

## 2016-07-19 DIAGNOSIS — E785 Hyperlipidemia, unspecified: Secondary | ICD-10-CM | POA: Diagnosis not present

## 2016-07-19 DIAGNOSIS — I10 Essential (primary) hypertension: Secondary | ICD-10-CM | POA: Diagnosis not present

## 2016-07-19 DIAGNOSIS — J181 Lobar pneumonia, unspecified organism: Secondary | ICD-10-CM | POA: Diagnosis not present

## 2016-07-19 DIAGNOSIS — Z9181 History of falling: Secondary | ICD-10-CM | POA: Diagnosis not present

## 2016-07-19 DIAGNOSIS — E039 Hypothyroidism, unspecified: Secondary | ICD-10-CM | POA: Diagnosis not present

## 2016-07-19 DIAGNOSIS — G9341 Metabolic encephalopathy: Secondary | ICD-10-CM | POA: Diagnosis not present

## 2016-07-19 DIAGNOSIS — G459 Transient cerebral ischemic attack, unspecified: Secondary | ICD-10-CM | POA: Diagnosis not present

## 2016-07-23 DIAGNOSIS — E119 Type 2 diabetes mellitus without complications: Secondary | ICD-10-CM | POA: Diagnosis not present

## 2016-07-23 DIAGNOSIS — G9341 Metabolic encephalopathy: Secondary | ICD-10-CM | POA: Diagnosis not present

## 2016-07-23 DIAGNOSIS — Z9181 History of falling: Secondary | ICD-10-CM | POA: Diagnosis not present

## 2016-07-23 DIAGNOSIS — I10 Essential (primary) hypertension: Secondary | ICD-10-CM | POA: Diagnosis not present

## 2016-07-23 DIAGNOSIS — S32502D Unspecified fracture of left pubis, subsequent encounter for fracture with routine healing: Secondary | ICD-10-CM | POA: Diagnosis not present

## 2016-07-23 DIAGNOSIS — I712 Thoracic aortic aneurysm, without rupture: Secondary | ICD-10-CM | POA: Diagnosis not present

## 2016-07-23 DIAGNOSIS — Z8673 Personal history of transient ischemic attack (TIA), and cerebral infarction without residual deficits: Secondary | ICD-10-CM | POA: Diagnosis not present

## 2016-07-23 DIAGNOSIS — E039 Hypothyroidism, unspecified: Secondary | ICD-10-CM | POA: Diagnosis not present

## 2016-07-23 DIAGNOSIS — E785 Hyperlipidemia, unspecified: Secondary | ICD-10-CM | POA: Diagnosis not present

## 2016-07-24 DIAGNOSIS — Z9181 History of falling: Secondary | ICD-10-CM | POA: Diagnosis not present

## 2016-07-24 DIAGNOSIS — I712 Thoracic aortic aneurysm, without rupture: Secondary | ICD-10-CM | POA: Diagnosis not present

## 2016-07-24 DIAGNOSIS — E785 Hyperlipidemia, unspecified: Secondary | ICD-10-CM | POA: Diagnosis not present

## 2016-07-24 DIAGNOSIS — I10 Essential (primary) hypertension: Secondary | ICD-10-CM | POA: Diagnosis not present

## 2016-07-24 DIAGNOSIS — Z8673 Personal history of transient ischemic attack (TIA), and cerebral infarction without residual deficits: Secondary | ICD-10-CM | POA: Diagnosis not present

## 2016-07-24 DIAGNOSIS — E119 Type 2 diabetes mellitus without complications: Secondary | ICD-10-CM | POA: Diagnosis not present

## 2016-07-24 DIAGNOSIS — E039 Hypothyroidism, unspecified: Secondary | ICD-10-CM | POA: Diagnosis not present

## 2016-07-24 DIAGNOSIS — S32502D Unspecified fracture of left pubis, subsequent encounter for fracture with routine healing: Secondary | ICD-10-CM | POA: Diagnosis not present

## 2016-07-24 DIAGNOSIS — G9341 Metabolic encephalopathy: Secondary | ICD-10-CM | POA: Diagnosis not present

## 2016-07-26 DIAGNOSIS — E785 Hyperlipidemia, unspecified: Secondary | ICD-10-CM | POA: Diagnosis not present

## 2016-07-26 DIAGNOSIS — G9341 Metabolic encephalopathy: Secondary | ICD-10-CM | POA: Diagnosis not present

## 2016-07-26 DIAGNOSIS — I712 Thoracic aortic aneurysm, without rupture: Secondary | ICD-10-CM | POA: Diagnosis not present

## 2016-07-26 DIAGNOSIS — S32502D Unspecified fracture of left pubis, subsequent encounter for fracture with routine healing: Secondary | ICD-10-CM | POA: Diagnosis not present

## 2016-07-26 DIAGNOSIS — Z8673 Personal history of transient ischemic attack (TIA), and cerebral infarction without residual deficits: Secondary | ICD-10-CM | POA: Diagnosis not present

## 2016-07-26 DIAGNOSIS — I10 Essential (primary) hypertension: Secondary | ICD-10-CM | POA: Diagnosis not present

## 2016-07-26 DIAGNOSIS — Z9181 History of falling: Secondary | ICD-10-CM | POA: Diagnosis not present

## 2016-07-26 DIAGNOSIS — E119 Type 2 diabetes mellitus without complications: Secondary | ICD-10-CM | POA: Diagnosis not present

## 2016-07-26 DIAGNOSIS — E039 Hypothyroidism, unspecified: Secondary | ICD-10-CM | POA: Diagnosis not present

## 2016-07-30 DIAGNOSIS — I10 Essential (primary) hypertension: Secondary | ICD-10-CM | POA: Diagnosis not present

## 2016-07-30 DIAGNOSIS — G9341 Metabolic encephalopathy: Secondary | ICD-10-CM | POA: Diagnosis not present

## 2016-07-30 DIAGNOSIS — Z8673 Personal history of transient ischemic attack (TIA), and cerebral infarction without residual deficits: Secondary | ICD-10-CM | POA: Diagnosis not present

## 2016-07-30 DIAGNOSIS — S32502D Unspecified fracture of left pubis, subsequent encounter for fracture with routine healing: Secondary | ICD-10-CM | POA: Diagnosis not present

## 2016-07-30 DIAGNOSIS — Z9181 History of falling: Secondary | ICD-10-CM | POA: Diagnosis not present

## 2016-07-30 DIAGNOSIS — E039 Hypothyroidism, unspecified: Secondary | ICD-10-CM | POA: Diagnosis not present

## 2016-07-30 DIAGNOSIS — I712 Thoracic aortic aneurysm, without rupture: Secondary | ICD-10-CM | POA: Diagnosis not present

## 2016-07-30 DIAGNOSIS — E119 Type 2 diabetes mellitus without complications: Secondary | ICD-10-CM | POA: Diagnosis not present

## 2016-07-30 DIAGNOSIS — E785 Hyperlipidemia, unspecified: Secondary | ICD-10-CM | POA: Diagnosis not present

## 2016-08-02 DIAGNOSIS — S32502D Unspecified fracture of left pubis, subsequent encounter for fracture with routine healing: Secondary | ICD-10-CM | POA: Diagnosis not present

## 2016-08-02 DIAGNOSIS — I712 Thoracic aortic aneurysm, without rupture: Secondary | ICD-10-CM | POA: Diagnosis not present

## 2016-08-02 DIAGNOSIS — Z8673 Personal history of transient ischemic attack (TIA), and cerebral infarction without residual deficits: Secondary | ICD-10-CM | POA: Diagnosis not present

## 2016-08-02 DIAGNOSIS — E119 Type 2 diabetes mellitus without complications: Secondary | ICD-10-CM | POA: Diagnosis not present

## 2016-08-02 DIAGNOSIS — G9341 Metabolic encephalopathy: Secondary | ICD-10-CM | POA: Diagnosis not present

## 2016-08-02 DIAGNOSIS — E785 Hyperlipidemia, unspecified: Secondary | ICD-10-CM | POA: Diagnosis not present

## 2016-08-02 DIAGNOSIS — Z9181 History of falling: Secondary | ICD-10-CM | POA: Diagnosis not present

## 2016-08-02 DIAGNOSIS — E039 Hypothyroidism, unspecified: Secondary | ICD-10-CM | POA: Diagnosis not present

## 2016-08-02 DIAGNOSIS — I10 Essential (primary) hypertension: Secondary | ICD-10-CM | POA: Diagnosis not present

## 2016-08-06 DIAGNOSIS — I712 Thoracic aortic aneurysm, without rupture: Secondary | ICD-10-CM | POA: Diagnosis not present

## 2016-08-06 DIAGNOSIS — I10 Essential (primary) hypertension: Secondary | ICD-10-CM | POA: Diagnosis not present

## 2016-08-06 DIAGNOSIS — Z9181 History of falling: Secondary | ICD-10-CM | POA: Diagnosis not present

## 2016-08-06 DIAGNOSIS — G9341 Metabolic encephalopathy: Secondary | ICD-10-CM | POA: Diagnosis not present

## 2016-08-06 DIAGNOSIS — E119 Type 2 diabetes mellitus without complications: Secondary | ICD-10-CM | POA: Diagnosis not present

## 2016-08-06 DIAGNOSIS — Z8673 Personal history of transient ischemic attack (TIA), and cerebral infarction without residual deficits: Secondary | ICD-10-CM | POA: Diagnosis not present

## 2016-08-06 DIAGNOSIS — S32502D Unspecified fracture of left pubis, subsequent encounter for fracture with routine healing: Secondary | ICD-10-CM | POA: Diagnosis not present

## 2016-08-06 DIAGNOSIS — E039 Hypothyroidism, unspecified: Secondary | ICD-10-CM | POA: Diagnosis not present

## 2016-08-06 DIAGNOSIS — E785 Hyperlipidemia, unspecified: Secondary | ICD-10-CM | POA: Diagnosis not present

## 2016-08-09 DIAGNOSIS — I10 Essential (primary) hypertension: Secondary | ICD-10-CM | POA: Diagnosis not present

## 2016-08-09 DIAGNOSIS — Z9181 History of falling: Secondary | ICD-10-CM | POA: Diagnosis not present

## 2016-08-09 DIAGNOSIS — Z8673 Personal history of transient ischemic attack (TIA), and cerebral infarction without residual deficits: Secondary | ICD-10-CM | POA: Diagnosis not present

## 2016-08-09 DIAGNOSIS — E119 Type 2 diabetes mellitus without complications: Secondary | ICD-10-CM | POA: Diagnosis not present

## 2016-08-09 DIAGNOSIS — E039 Hypothyroidism, unspecified: Secondary | ICD-10-CM | POA: Diagnosis not present

## 2016-08-09 DIAGNOSIS — G9341 Metabolic encephalopathy: Secondary | ICD-10-CM | POA: Diagnosis not present

## 2016-08-09 DIAGNOSIS — I712 Thoracic aortic aneurysm, without rupture: Secondary | ICD-10-CM | POA: Diagnosis not present

## 2016-08-09 DIAGNOSIS — S32502D Unspecified fracture of left pubis, subsequent encounter for fracture with routine healing: Secondary | ICD-10-CM | POA: Diagnosis not present

## 2016-08-09 DIAGNOSIS — E785 Hyperlipidemia, unspecified: Secondary | ICD-10-CM | POA: Diagnosis not present

## 2016-08-14 DIAGNOSIS — Z8673 Personal history of transient ischemic attack (TIA), and cerebral infarction without residual deficits: Secondary | ICD-10-CM | POA: Diagnosis not present

## 2016-08-14 DIAGNOSIS — E039 Hypothyroidism, unspecified: Secondary | ICD-10-CM | POA: Diagnosis not present

## 2016-08-14 DIAGNOSIS — I10 Essential (primary) hypertension: Secondary | ICD-10-CM | POA: Diagnosis not present

## 2016-08-14 DIAGNOSIS — Z9181 History of falling: Secondary | ICD-10-CM | POA: Diagnosis not present

## 2016-08-14 DIAGNOSIS — E119 Type 2 diabetes mellitus without complications: Secondary | ICD-10-CM | POA: Diagnosis not present

## 2016-08-14 DIAGNOSIS — I712 Thoracic aortic aneurysm, without rupture: Secondary | ICD-10-CM | POA: Diagnosis not present

## 2016-08-14 DIAGNOSIS — G9341 Metabolic encephalopathy: Secondary | ICD-10-CM | POA: Diagnosis not present

## 2016-08-14 DIAGNOSIS — S32502D Unspecified fracture of left pubis, subsequent encounter for fracture with routine healing: Secondary | ICD-10-CM | POA: Diagnosis not present

## 2016-08-14 DIAGNOSIS — E785 Hyperlipidemia, unspecified: Secondary | ICD-10-CM | POA: Diagnosis not present

## 2016-08-17 DIAGNOSIS — Z8673 Personal history of transient ischemic attack (TIA), and cerebral infarction without residual deficits: Secondary | ICD-10-CM | POA: Diagnosis not present

## 2016-08-17 DIAGNOSIS — G9341 Metabolic encephalopathy: Secondary | ICD-10-CM | POA: Diagnosis not present

## 2016-08-17 DIAGNOSIS — S32502D Unspecified fracture of left pubis, subsequent encounter for fracture with routine healing: Secondary | ICD-10-CM | POA: Diagnosis not present

## 2016-08-17 DIAGNOSIS — Z9181 History of falling: Secondary | ICD-10-CM | POA: Diagnosis not present

## 2016-08-17 DIAGNOSIS — I10 Essential (primary) hypertension: Secondary | ICD-10-CM | POA: Diagnosis not present

## 2016-08-17 DIAGNOSIS — E039 Hypothyroidism, unspecified: Secondary | ICD-10-CM | POA: Diagnosis not present

## 2016-08-17 DIAGNOSIS — I712 Thoracic aortic aneurysm, without rupture: Secondary | ICD-10-CM | POA: Diagnosis not present

## 2016-08-17 DIAGNOSIS — E785 Hyperlipidemia, unspecified: Secondary | ICD-10-CM | POA: Diagnosis not present

## 2016-08-17 DIAGNOSIS — E119 Type 2 diabetes mellitus without complications: Secondary | ICD-10-CM | POA: Diagnosis not present

## 2016-08-23 DIAGNOSIS — I712 Thoracic aortic aneurysm, without rupture: Secondary | ICD-10-CM | POA: Diagnosis not present

## 2016-08-23 DIAGNOSIS — S32502D Unspecified fracture of left pubis, subsequent encounter for fracture with routine healing: Secondary | ICD-10-CM | POA: Diagnosis not present

## 2016-08-23 DIAGNOSIS — Z8673 Personal history of transient ischemic attack (TIA), and cerebral infarction without residual deficits: Secondary | ICD-10-CM | POA: Diagnosis not present

## 2016-08-23 DIAGNOSIS — E119 Type 2 diabetes mellitus without complications: Secondary | ICD-10-CM | POA: Diagnosis not present

## 2016-08-23 DIAGNOSIS — I10 Essential (primary) hypertension: Secondary | ICD-10-CM | POA: Diagnosis not present

## 2016-08-23 DIAGNOSIS — G9341 Metabolic encephalopathy: Secondary | ICD-10-CM | POA: Diagnosis not present

## 2016-08-23 DIAGNOSIS — Z9181 History of falling: Secondary | ICD-10-CM | POA: Diagnosis not present

## 2016-08-23 DIAGNOSIS — E039 Hypothyroidism, unspecified: Secondary | ICD-10-CM | POA: Diagnosis not present

## 2016-08-23 DIAGNOSIS — E785 Hyperlipidemia, unspecified: Secondary | ICD-10-CM | POA: Diagnosis not present

## 2016-08-25 DIAGNOSIS — G4733 Obstructive sleep apnea (adult) (pediatric): Secondary | ICD-10-CM | POA: Diagnosis not present

## 2016-08-28 DIAGNOSIS — E119 Type 2 diabetes mellitus without complications: Secondary | ICD-10-CM | POA: Diagnosis not present

## 2016-08-28 DIAGNOSIS — S32502D Unspecified fracture of left pubis, subsequent encounter for fracture with routine healing: Secondary | ICD-10-CM | POA: Diagnosis not present

## 2016-08-28 DIAGNOSIS — Z8673 Personal history of transient ischemic attack (TIA), and cerebral infarction without residual deficits: Secondary | ICD-10-CM | POA: Diagnosis not present

## 2016-08-28 DIAGNOSIS — E039 Hypothyroidism, unspecified: Secondary | ICD-10-CM | POA: Diagnosis not present

## 2016-08-28 DIAGNOSIS — I10 Essential (primary) hypertension: Secondary | ICD-10-CM | POA: Diagnosis not present

## 2016-08-28 DIAGNOSIS — G9341 Metabolic encephalopathy: Secondary | ICD-10-CM | POA: Diagnosis not present

## 2016-08-28 DIAGNOSIS — E785 Hyperlipidemia, unspecified: Secondary | ICD-10-CM | POA: Diagnosis not present

## 2016-08-28 DIAGNOSIS — Z9181 History of falling: Secondary | ICD-10-CM | POA: Diagnosis not present

## 2016-08-28 DIAGNOSIS — I712 Thoracic aortic aneurysm, without rupture: Secondary | ICD-10-CM | POA: Diagnosis not present

## 2016-08-30 DIAGNOSIS — M21371 Foot drop, right foot: Secondary | ICD-10-CM | POA: Diagnosis not present

## 2016-08-30 DIAGNOSIS — F0150 Vascular dementia without behavioral disturbance: Secondary | ICD-10-CM | POA: Diagnosis not present

## 2016-08-30 DIAGNOSIS — F0280 Dementia in other diseases classified elsewhere without behavioral disturbance: Secondary | ICD-10-CM | POA: Diagnosis not present

## 2016-08-30 DIAGNOSIS — G4733 Obstructive sleep apnea (adult) (pediatric): Secondary | ICD-10-CM | POA: Diagnosis not present

## 2016-08-30 DIAGNOSIS — E538 Deficiency of other specified B group vitamins: Secondary | ICD-10-CM | POA: Diagnosis not present

## 2016-08-30 DIAGNOSIS — G309 Alzheimer's disease, unspecified: Secondary | ICD-10-CM | POA: Diagnosis not present

## 2016-09-05 DIAGNOSIS — E538 Deficiency of other specified B group vitamins: Secondary | ICD-10-CM | POA: Diagnosis not present

## 2016-09-07 DIAGNOSIS — E785 Hyperlipidemia, unspecified: Secondary | ICD-10-CM | POA: Diagnosis not present

## 2016-09-07 DIAGNOSIS — E039 Hypothyroidism, unspecified: Secondary | ICD-10-CM | POA: Diagnosis not present

## 2016-09-07 DIAGNOSIS — S32502D Unspecified fracture of left pubis, subsequent encounter for fracture with routine healing: Secondary | ICD-10-CM | POA: Diagnosis not present

## 2016-09-07 DIAGNOSIS — Z9181 History of falling: Secondary | ICD-10-CM | POA: Diagnosis not present

## 2016-09-07 DIAGNOSIS — I712 Thoracic aortic aneurysm, without rupture: Secondary | ICD-10-CM | POA: Diagnosis not present

## 2016-09-07 DIAGNOSIS — Z8673 Personal history of transient ischemic attack (TIA), and cerebral infarction without residual deficits: Secondary | ICD-10-CM | POA: Diagnosis not present

## 2016-09-07 DIAGNOSIS — E119 Type 2 diabetes mellitus without complications: Secondary | ICD-10-CM | POA: Diagnosis not present

## 2016-09-07 DIAGNOSIS — I10 Essential (primary) hypertension: Secondary | ICD-10-CM | POA: Diagnosis not present

## 2016-09-07 DIAGNOSIS — G9341 Metabolic encephalopathy: Secondary | ICD-10-CM | POA: Diagnosis not present

## 2016-09-10 DIAGNOSIS — G4733 Obstructive sleep apnea (adult) (pediatric): Secondary | ICD-10-CM | POA: Diagnosis not present

## 2016-09-11 DIAGNOSIS — M810 Age-related osteoporosis without current pathological fracture: Secondary | ICD-10-CM | POA: Diagnosis not present

## 2016-09-12 DIAGNOSIS — E538 Deficiency of other specified B group vitamins: Secondary | ICD-10-CM | POA: Diagnosis not present

## 2016-09-18 DIAGNOSIS — Z8673 Personal history of transient ischemic attack (TIA), and cerebral infarction without residual deficits: Secondary | ICD-10-CM | POA: Diagnosis not present

## 2016-09-18 DIAGNOSIS — E039 Hypothyroidism, unspecified: Secondary | ICD-10-CM | POA: Diagnosis not present

## 2016-09-18 DIAGNOSIS — I712 Thoracic aortic aneurysm, without rupture: Secondary | ICD-10-CM | POA: Diagnosis not present

## 2016-09-18 DIAGNOSIS — E119 Type 2 diabetes mellitus without complications: Secondary | ICD-10-CM | POA: Diagnosis not present

## 2016-09-18 DIAGNOSIS — Z9181 History of falling: Secondary | ICD-10-CM | POA: Diagnosis not present

## 2016-09-18 DIAGNOSIS — I10 Essential (primary) hypertension: Secondary | ICD-10-CM | POA: Diagnosis not present

## 2016-09-18 DIAGNOSIS — E785 Hyperlipidemia, unspecified: Secondary | ICD-10-CM | POA: Diagnosis not present

## 2016-09-18 DIAGNOSIS — S32502D Unspecified fracture of left pubis, subsequent encounter for fracture with routine healing: Secondary | ICD-10-CM | POA: Diagnosis not present

## 2016-09-18 DIAGNOSIS — G9341 Metabolic encephalopathy: Secondary | ICD-10-CM | POA: Diagnosis not present

## 2016-09-19 DIAGNOSIS — E538 Deficiency of other specified B group vitamins: Secondary | ICD-10-CM | POA: Diagnosis not present

## 2016-09-20 DIAGNOSIS — S32502D Unspecified fracture of left pubis, subsequent encounter for fracture with routine healing: Secondary | ICD-10-CM | POA: Diagnosis not present

## 2016-09-20 DIAGNOSIS — G9341 Metabolic encephalopathy: Secondary | ICD-10-CM | POA: Diagnosis not present

## 2016-09-20 DIAGNOSIS — I10 Essential (primary) hypertension: Secondary | ICD-10-CM | POA: Diagnosis not present

## 2016-09-20 DIAGNOSIS — E119 Type 2 diabetes mellitus without complications: Secondary | ICD-10-CM | POA: Diagnosis not present

## 2016-09-26 DIAGNOSIS — E039 Hypothyroidism, unspecified: Secondary | ICD-10-CM | POA: Diagnosis not present

## 2016-09-26 DIAGNOSIS — E119 Type 2 diabetes mellitus without complications: Secondary | ICD-10-CM | POA: Diagnosis not present

## 2016-09-26 DIAGNOSIS — Z79899 Other long term (current) drug therapy: Secondary | ICD-10-CM | POA: Diagnosis not present

## 2016-09-26 DIAGNOSIS — E538 Deficiency of other specified B group vitamins: Secondary | ICD-10-CM | POA: Diagnosis not present

## 2016-09-26 DIAGNOSIS — E559 Vitamin D deficiency, unspecified: Secondary | ICD-10-CM | POA: Diagnosis not present

## 2016-09-26 DIAGNOSIS — E1165 Type 2 diabetes mellitus with hyperglycemia: Secondary | ICD-10-CM | POA: Diagnosis not present

## 2016-09-26 DIAGNOSIS — Z794 Long term (current) use of insulin: Secondary | ICD-10-CM | POA: Diagnosis not present

## 2016-10-04 DIAGNOSIS — Z9989 Dependence on other enabling machines and devices: Secondary | ICD-10-CM | POA: Diagnosis not present

## 2016-10-04 DIAGNOSIS — E039 Hypothyroidism, unspecified: Secondary | ICD-10-CM | POA: Diagnosis not present

## 2016-10-04 DIAGNOSIS — G4733 Obstructive sleep apnea (adult) (pediatric): Secondary | ICD-10-CM | POA: Diagnosis not present

## 2016-10-04 DIAGNOSIS — E119 Type 2 diabetes mellitus without complications: Secondary | ICD-10-CM | POA: Diagnosis not present

## 2016-10-04 DIAGNOSIS — Z79899 Other long term (current) drug therapy: Secondary | ICD-10-CM | POA: Diagnosis not present

## 2016-10-04 DIAGNOSIS — I10 Essential (primary) hypertension: Secondary | ICD-10-CM | POA: Diagnosis not present

## 2016-10-04 DIAGNOSIS — G309 Alzheimer's disease, unspecified: Secondary | ICD-10-CM | POA: Diagnosis not present

## 2016-10-04 DIAGNOSIS — N401 Enlarged prostate with lower urinary tract symptoms: Secondary | ICD-10-CM | POA: Diagnosis not present

## 2016-10-04 DIAGNOSIS — I679 Cerebrovascular disease, unspecified: Secondary | ICD-10-CM | POA: Diagnosis not present

## 2016-10-10 DIAGNOSIS — G4733 Obstructive sleep apnea (adult) (pediatric): Secondary | ICD-10-CM | POA: Diagnosis not present

## 2016-10-24 DIAGNOSIS — E538 Deficiency of other specified B group vitamins: Secondary | ICD-10-CM | POA: Diagnosis not present

## 2016-11-10 DIAGNOSIS — G4733 Obstructive sleep apnea (adult) (pediatric): Secondary | ICD-10-CM | POA: Diagnosis not present

## 2016-11-12 DIAGNOSIS — Z794 Long term (current) use of insulin: Secondary | ICD-10-CM | POA: Diagnosis not present

## 2016-11-12 DIAGNOSIS — M810 Age-related osteoporosis without current pathological fracture: Secondary | ICD-10-CM | POA: Diagnosis not present

## 2016-11-12 DIAGNOSIS — Z8781 Personal history of (healed) traumatic fracture: Secondary | ICD-10-CM | POA: Diagnosis not present

## 2016-11-12 DIAGNOSIS — E119 Type 2 diabetes mellitus without complications: Secondary | ICD-10-CM | POA: Diagnosis not present

## 2016-11-12 DIAGNOSIS — R600 Localized edema: Secondary | ICD-10-CM | POA: Diagnosis not present

## 2016-11-21 DIAGNOSIS — E538 Deficiency of other specified B group vitamins: Secondary | ICD-10-CM | POA: Diagnosis not present

## 2016-12-10 DIAGNOSIS — G4733 Obstructive sleep apnea (adult) (pediatric): Secondary | ICD-10-CM | POA: Diagnosis not present

## 2016-12-19 DIAGNOSIS — E538 Deficiency of other specified B group vitamins: Secondary | ICD-10-CM | POA: Diagnosis not present

## 2016-12-20 DIAGNOSIS — G4733 Obstructive sleep apnea (adult) (pediatric): Secondary | ICD-10-CM | POA: Diagnosis not present

## 2016-12-31 ENCOUNTER — Other Ambulatory Visit: Payer: Self-pay | Admitting: Nurse Practitioner

## 2016-12-31 DIAGNOSIS — G459 Transient cerebral ischemic attack, unspecified: Secondary | ICD-10-CM

## 2016-12-31 DIAGNOSIS — G4733 Obstructive sleep apnea (adult) (pediatric): Secondary | ICD-10-CM | POA: Diagnosis not present

## 2016-12-31 DIAGNOSIS — I639 Cerebral infarction, unspecified: Secondary | ICD-10-CM | POA: Diagnosis not present

## 2016-12-31 DIAGNOSIS — R200 Anesthesia of skin: Secondary | ICD-10-CM | POA: Diagnosis not present

## 2016-12-31 DIAGNOSIS — M21371 Foot drop, right foot: Secondary | ICD-10-CM | POA: Diagnosis not present

## 2016-12-31 DIAGNOSIS — E538 Deficiency of other specified B group vitamins: Secondary | ICD-10-CM | POA: Diagnosis not present

## 2017-01-07 DIAGNOSIS — E119 Type 2 diabetes mellitus without complications: Secondary | ICD-10-CM | POA: Diagnosis not present

## 2017-01-09 ENCOUNTER — Ambulatory Visit
Admission: RE | Admit: 2017-01-09 | Discharge: 2017-01-09 | Disposition: A | Discharge status: Home / Self Care | Payer: Medicare HMO | Source: Ambulatory Visit | Attending: Nurse Practitioner | Admitting: Nurse Practitioner

## 2017-01-09 DIAGNOSIS — G319 Degenerative disease of nervous system, unspecified: Secondary | ICD-10-CM | POA: Diagnosis not present

## 2017-01-09 DIAGNOSIS — G459 Transient cerebral ischemic attack, unspecified: Secondary | ICD-10-CM | POA: Diagnosis not present

## 2017-01-09 DIAGNOSIS — Z8673 Personal history of transient ischemic attack (TIA), and cerebral infarction without residual deficits: Secondary | ICD-10-CM | POA: Insufficient documentation

## 2017-01-09 DIAGNOSIS — I998 Other disorder of circulatory system: Secondary | ICD-10-CM | POA: Insufficient documentation

## 2017-01-10 DIAGNOSIS — G4733 Obstructive sleep apnea (adult) (pediatric): Secondary | ICD-10-CM | POA: Diagnosis not present

## 2017-01-16 DIAGNOSIS — E538 Deficiency of other specified B group vitamins: Secondary | ICD-10-CM | POA: Diagnosis not present

## 2017-02-10 DIAGNOSIS — G4733 Obstructive sleep apnea (adult) (pediatric): Secondary | ICD-10-CM | POA: Diagnosis not present

## 2017-02-11 DIAGNOSIS — E1165 Type 2 diabetes mellitus with hyperglycemia: Secondary | ICD-10-CM | POA: Diagnosis not present

## 2017-02-11 DIAGNOSIS — Z794 Long term (current) use of insulin: Secondary | ICD-10-CM | POA: Diagnosis not present

## 2017-02-11 DIAGNOSIS — E119 Type 2 diabetes mellitus without complications: Secondary | ICD-10-CM | POA: Diagnosis not present

## 2017-02-18 DIAGNOSIS — E1140 Type 2 diabetes mellitus with diabetic neuropathy, unspecified: Secondary | ICD-10-CM | POA: Diagnosis not present

## 2017-02-18 DIAGNOSIS — Z794 Long term (current) use of insulin: Secondary | ICD-10-CM | POA: Diagnosis not present

## 2017-02-18 DIAGNOSIS — Q6689 Other  specified congenital deformities of feet: Secondary | ICD-10-CM | POA: Diagnosis not present

## 2017-02-18 DIAGNOSIS — M810 Age-related osteoporosis without current pathological fracture: Secondary | ICD-10-CM | POA: Diagnosis not present

## 2017-02-18 DIAGNOSIS — E1165 Type 2 diabetes mellitus with hyperglycemia: Secondary | ICD-10-CM | POA: Diagnosis not present

## 2017-02-18 DIAGNOSIS — Z8781 Personal history of (healed) traumatic fracture: Secondary | ICD-10-CM | POA: Diagnosis not present

## 2017-02-18 DIAGNOSIS — M7752 Other enthesopathy of left foot: Secondary | ICD-10-CM | POA: Diagnosis not present

## 2017-02-18 DIAGNOSIS — B351 Tinea unguium: Secondary | ICD-10-CM | POA: Diagnosis not present

## 2017-02-18 DIAGNOSIS — M2012 Hallux valgus (acquired), left foot: Secondary | ICD-10-CM | POA: Diagnosis not present

## 2017-02-25 DIAGNOSIS — E538 Deficiency of other specified B group vitamins: Secondary | ICD-10-CM | POA: Diagnosis not present

## 2017-02-25 DIAGNOSIS — R202 Paresthesia of skin: Secondary | ICD-10-CM | POA: Diagnosis not present

## 2017-02-25 DIAGNOSIS — R200 Anesthesia of skin: Secondary | ICD-10-CM | POA: Diagnosis not present

## 2017-02-25 DIAGNOSIS — F0280 Dementia in other diseases classified elsewhere without behavioral disturbance: Secondary | ICD-10-CM | POA: Diagnosis not present

## 2017-02-25 DIAGNOSIS — G309 Alzheimer's disease, unspecified: Secondary | ICD-10-CM | POA: Diagnosis not present

## 2017-02-25 DIAGNOSIS — F0150 Vascular dementia without behavioral disturbance: Secondary | ICD-10-CM | POA: Diagnosis not present

## 2017-02-25 DIAGNOSIS — I6381 Other cerebral infarction due to occlusion or stenosis of small artery: Secondary | ICD-10-CM | POA: Diagnosis not present

## 2017-02-25 DIAGNOSIS — G4733 Obstructive sleep apnea (adult) (pediatric): Secondary | ICD-10-CM | POA: Diagnosis not present

## 2017-03-12 DIAGNOSIS — G4733 Obstructive sleep apnea (adult) (pediatric): Secondary | ICD-10-CM | POA: Diagnosis not present

## 2017-03-18 DIAGNOSIS — G608 Other hereditary and idiopathic neuropathies: Secondary | ICD-10-CM | POA: Diagnosis not present

## 2017-03-18 DIAGNOSIS — M810 Age-related osteoporosis without current pathological fracture: Secondary | ICD-10-CM | POA: Diagnosis not present

## 2017-03-18 DIAGNOSIS — G5603 Carpal tunnel syndrome, bilateral upper limbs: Secondary | ICD-10-CM | POA: Diagnosis not present

## 2017-03-29 ENCOUNTER — Emergency Department: Payer: Medicare HMO

## 2017-03-29 ENCOUNTER — Other Ambulatory Visit: Payer: Self-pay

## 2017-03-29 ENCOUNTER — Emergency Department
Admission: EM | Admit: 2017-03-29 | Discharge: 2017-03-29 | Disposition: A | Discharge status: Home / Self Care | Payer: Medicare HMO | Attending: Emergency Medicine | Admitting: Emergency Medicine

## 2017-03-29 DIAGNOSIS — E119 Type 2 diabetes mellitus without complications: Secondary | ICD-10-CM | POA: Diagnosis not present

## 2017-03-29 DIAGNOSIS — Z79899 Other long term (current) drug therapy: Secondary | ICD-10-CM | POA: Diagnosis not present

## 2017-03-29 DIAGNOSIS — R410 Disorientation, unspecified: Secondary | ICD-10-CM | POA: Diagnosis not present

## 2017-03-29 DIAGNOSIS — I10 Essential (primary) hypertension: Secondary | ICD-10-CM | POA: Diagnosis not present

## 2017-03-29 DIAGNOSIS — Z8673 Personal history of transient ischemic attack (TIA), and cerebral infarction without residual deficits: Secondary | ICD-10-CM | POA: Insufficient documentation

## 2017-03-29 DIAGNOSIS — Z7982 Long term (current) use of aspirin: Secondary | ICD-10-CM | POA: Insufficient documentation

## 2017-03-29 DIAGNOSIS — R4182 Altered mental status, unspecified: Secondary | ICD-10-CM | POA: Diagnosis not present

## 2017-03-29 DIAGNOSIS — E039 Hypothyroidism, unspecified: Secondary | ICD-10-CM | POA: Diagnosis not present

## 2017-03-29 DIAGNOSIS — Z794 Long term (current) use of insulin: Secondary | ICD-10-CM | POA: Insufficient documentation

## 2017-03-29 DIAGNOSIS — Z7902 Long term (current) use of antithrombotics/antiplatelets: Secondary | ICD-10-CM | POA: Diagnosis not present

## 2017-03-29 LAB — URINALYSIS, COMPLETE (UACMP) WITH MICROSCOPIC
BACTERIA UA: NONE SEEN
Bilirubin Urine: NEGATIVE
GLUCOSE, UA: NEGATIVE mg/dL
HGB URINE DIPSTICK: NEGATIVE
KETONES UR: NEGATIVE mg/dL
Leukocytes, UA: NEGATIVE
Nitrite: NEGATIVE
PROTEIN: NEGATIVE mg/dL
SQUAMOUS EPITHELIAL / LPF: NONE SEEN
Specific Gravity, Urine: 1.013 (ref 1.005–1.030)
pH: 7 (ref 5.0–8.0)

## 2017-03-29 LAB — COMPREHENSIVE METABOLIC PANEL
ALBUMIN: 4.3 g/dL (ref 3.5–5.0)
ALK PHOS: 64 U/L (ref 38–126)
ALT: 15 U/L — ABNORMAL LOW (ref 17–63)
ANION GAP: 11 (ref 5–15)
AST: 24 U/L (ref 15–41)
BUN: 13 mg/dL (ref 6–20)
CALCIUM: 9 mg/dL (ref 8.9–10.3)
CO2: 26 mmol/L (ref 22–32)
Chloride: 96 mmol/L — ABNORMAL LOW (ref 101–111)
Creatinine, Ser: 0.74 mg/dL (ref 0.61–1.24)
GFR calc Af Amer: >60 mL/min (ref >60)
GFR calc non Af Amer: >60 mL/min (ref >60)
GLUCOSE: 126 mg/dL — AB (ref 65–99)
POTASSIUM: 3.7 mmol/L (ref 3.5–5.1)
SODIUM: 133 mmol/L — AB (ref 135–145)
Total Bilirubin: 0.4 mg/dL (ref 0.3–1.2)
Total Protein: 6.7 g/dL (ref 6.5–8.1)

## 2017-03-29 LAB — CBC WITH DIFFERENTIAL/PLATELET
BASOS ABS: 0 10*3/uL (ref 0–0.1)
Basophils Relative: 1 %
EOS ABS: 0.2 10*3/uL (ref 0–0.7)
EOS PCT: 5 %
HCT: 37.1 % — ABNORMAL LOW (ref 40.0–52.0)
Hemoglobin: 12.4 g/dL — ABNORMAL LOW (ref 13.0–18.0)
LYMPHS PCT: 15 %
Lymphs Abs: 0.6 10*3/uL — ABNORMAL LOW (ref 1.0–3.6)
MCH: 31.9 pg (ref 26.0–34.0)
MCHC: 33.4 g/dL (ref 32.0–36.0)
MCV: 95.6 fL (ref 80.0–100.0)
MONO ABS: 0.5 10*3/uL (ref 0.2–1.0)
Monocytes Relative: 12 %
Neutro Abs: 2.7 10*3/uL (ref 1.4–6.5)
Neutrophils Relative %: 67 %
PLATELETS: 188 10*3/uL (ref 150–440)
RBC: 3.88 MIL/uL — AB (ref 4.40–5.90)
RDW: 13.3 % (ref 11.5–14.5)
WBC: 4 10*3/uL (ref 3.8–10.6)

## 2017-03-29 LAB — PROTIME-INR
INR: 0.93
Prothrombin Time: 12.4 seconds (ref 11.4–15.2)

## 2017-03-29 LAB — BLOOD GAS, VENOUS
PATIENT TEMPERATURE: 37
pCO2, Ven: 54 mmHg (ref 44.0–60.0)
pH, Ven: 7.36 (ref 7.250–7.430)

## 2017-03-29 LAB — AMMONIA

## 2017-03-29 LAB — TROPONIN I

## 2017-03-29 LAB — GLUCOSE, CAPILLARY: Glucose-Capillary: 114 mg/dL — ABNORMAL HIGH (ref 65–99)

## 2017-03-29 NOTE — ED Triage Notes (Signed)
Patient woke up slightly confused. Wears CPAP machine. Knows his name and location, unaware or the date, month and year. Stated year is 88.

## 2017-03-29 NOTE — Discharge Instructions (Signed)
It is unclear why you were confused today.  He would prefer to go home which is certainly her choice but does limit our workup.  If there is new or worrisome symptoms including headache, confusion, seizure activity, vomiting, weakness or numbness or anything else please return to the emergency department.  Otherwise follow close with neurologist and primary care doctor.

## 2017-03-29 NOTE — ED Notes (Addendum)
Patient does not appear to be in any acute distress at time of discharge. Patient wheeled to lobby in wheelchair. Patient/family denies any comments or concerns regarding discharge.

## 2017-03-29 NOTE — ED Provider Notes (Addendum)
[facility] Provider Note  ____________________________________________   I have reviewed the triage vital signs and the nursing notes.   HISTORY  Chief Complaint Altered Mental Status    HPI TIMTOHY BROSKI is a 83 y.o. male who presents today from home.  History is limited because of patient mental status.  History is per family.  Patient does have a history of some degree of mild confusion at baseline however this morning he woke up and was more confused and somewhat somnolent.  This is happened to him before with "TIAs".  He has had multiple TIAs in the past.  Patient has not had any antecedent illness such as fever chills cough headache dysuria urinary frequency or falls.  He was in his normal state of health when he went to bed last night.  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  Last admission for similar was earlier this year at which time he had a reassuring MRI.  Patient does use CPAP.  Was normal when he went to bed last night at 10 PM, when he woke up this morning seemed confused and was taking off his BiPAP.  Level 5 chart caveat; no further history available due to patient status.   Past Medical History:  Diagnosis Date  . Arthritis   . BPH (benign prostatic hyperplasia)   . DM (diabetes mellitus) ([place])   . Hyperlipidemia   . Hypertension   . Hypothyroidism   . Osteoporosis   . Pelvic fracture ([place])   . TIA (transient ischemic attack) 1998, 2014    Patient Active Problem List   Diagnosis Date Noted  . TIA (transient ischemic attack) 07/16/2016  . Confusion   . Ascending aortic aneurysm ([place]) 05/19/2016  . Hyponatremia 05/19/2016  . Generalized weakness 05/19/2016  . Sepsis due to pneumonia ([place]) 05/18/2016  . Hip fracture ([place]) 09/10/2015    Past Surgical History:  Procedure Laterality Date  . bilateral hip replacement Bilateral 1998  . Left knee replacement Left 2007  . LUMBAR LAMINECTOMY   2010  . right hip revision Right 2008    Prior to Admission medications   Medication Sig Start Date End Date Taking? Authorizing Provider  ACCU-CHEK SOFTCLIX LANCETS lancets Use as instructed twice daily 12/16/15   [provider]  aspirin EC 81 MG tablet Take 81 mg by mouth daily.    [provider]  calcium-vitamin D (OSCAL WITH D) 500-200 MG-UNIT tablet Take 1 tablet by mouth daily.    [provider]  clopidogrel (PLAVIX) 75 MG tablet Take 75 mg by mouth daily. 07/11/15   [provider]  dextromethorphan (DELSYM) 30 MG/5ML liquid Take 5 mLs (30 mg total) by mouth 2 (two) times daily. Patient not taking: Reported on 07/12/2016 05/19/16   Theodoro Grist, MD  feeding supplement, GLUCERNA SHAKE, (GLUCERNA SHAKE) LIQD Take 237 mLs by mouth 2 (two) times daily between meals. 05/19/16   Theodoro Grist, MD  finasteride (PROSCAR) 5 MG tablet Take 5 mg by mouth daily.    [provider]  gabapentin (NEURONTIN) 300 MG capsule Take by mouth. 07/10/16 07/10/17  [provider]  hydrocortisone (ANUSOL-HC) 25 MG suppository Place rectally.    [provider]  insulin aspart (NOVOLOG) 100 UNIT/ML FlexPen Inject 5 Units into the skin 3 (three) times daily with meals. Patient not taking: Reported on 07/12/2016 05/19/16   Theodoro Grist, MD  LANTUS SOLOSTAR 100 UNIT/ML Solostar Pen Inject 18 Units into the skin  every morning.  06/13/15   [provider]  levothyroxine (SYNTHROID, LEVOTHROID) 25 MCG tablet Take 1 tablet by mouth daily. 04/26/16   [provider]  lisinopril (PRINIVIL,ZESTRIL) 20 MG tablet Take 10 mg by mouth daily.  07/11/15   [provider]  meclizine (ANTIVERT) 25 MG tablet Take 1 tablet (25 mg total) by mouth 3 (three) times daily as needed. Patient not taking: Reported on 07/12/2016 05/30/16   Merlyn Lot, MD  metFORMIN (GLUCOPHAGE) 500 MG tablet Take 1,000 mg by mouth 2 (two) times daily.  08/12/15   [provider]  pioglitazone (ACTOS) 15 MG tablet Take by mouth. 07/09/16 07/09/17  [provider]  polyethylene glycol (MIRALAX / GLYCOLAX) packet Take 17 g by mouth daily. 09/12/15   Loletha Grayer, MD  senna-docusate (SENOKOT-S) 8.6-50 MG tablet Take 1 tablet by mouth at bedtime as needed for mild constipation. 09/12/15   Loletha Grayer, MD  simvastatin (ZOCOR) 20 MG tablet Take 20 mg by mouth at bedtime.  07/11/15   [provider]  tamsulosin (FLOMAX) 0.4 MG CAPS capsule Take 0.4 mg by mouth 2 (two) times daily. 08/08/15   [provider]  Teriparatide, Recombinant, 600 MCG/2.4ML SOLN Inject into the skin. 07/09/16 07/09/17  [provider]    Allergies Patient has no known allergies.  Family History  Problem Relation Age of Onset  . CVA Father 11  . Prostate cancer Neg Hx   . Kidney cancer Neg Hx   . Bladder Cancer Neg Hx     Social History Social History   Tobacco Use  . Smoking status: Never Smoker  . Smokeless tobacco: Never Used  Substance Use Topics  . Alcohol use: No    Alcohol/week: 0.0 oz  . Drug use: No    Review of Systems, per family, as well as limited per patient Constitutional: No fever/chills Eyes: Unknown ENT: No sore throat. No stiff neck no neck pain Cardiovascular: Denies chest pain. Respiratory: Denies shortness of breath. Gastrointestinal:   no vomiting.  No diarrhea.  No constipation. Genitourinary: Negative for dysuria. Musculoskeletal: Negative lower extremity swelling Skin: Negative for rash. Neurological: Negative for severe headaches, focal weakness or numbness.   ____________________________________________   PHYSICAL EXAM:  VITAL SIGNS: ED Triage Vitals  Enc Vitals Group     BP 03/29/17 0822 (!) 150/64     Pulse Rate 03/29/17 0822 (!) 57     Resp 03/29/17 0822 18     Temp 03/29/17 0822 98.6 F (37 C)     Temp Source 03/29/17 0822 Oral     SpO2 03/29/17 0822 95 %     Weight 03/29/17 0824 160  lb (72.6 kg)     Height 03/29/17 0824 5\' 8"  (1.727 m)     Head Circumference --      Peak Flow --      Pain Score 03/29/17 0822 0     Pain Loc --      Pain Edu? --      Excl. in [place]? --     Constitutional: Somewhat somnolent but awake and guarding his airway, his oriented to name and place unsure of the year, thinks it might be 1960. Eyes: Conjunctivae are normal Head: Atraumatic HEENT: No congestion/rhinnorhea. Mucous membranes are moist.  Oropharynx non-erythematous Neck:   Nontender with no meningismus, no masses, no stridor Cardiovascular: Normal rate, regular rhythm. Grossly normal heart sounds.  Good peripheral circulation. Respiratory: Normal respiratory effort.  No retractions. Lungs CTAB. Abdominal: Soft  and nontender. No distention. No guarding no rebound Back:  There is no focal tenderness or step off.  there is no midline tenderness there are no lesions noted. there is no CVA tenderness Musculoskeletal: No lower extremity tenderness, no upper extremity tenderness. No joint effusions, no DVT signs strong distal pulses no edema Neurologic: Somewhat limited exam as patient is somnolent however to the extent that I can determine there is no focal neurologic deficit, he follows commands, he lifts both legs and both arms.  I do not see any obvious drift on one side of the other relatively he seems generally somewhat weak and therefore this limits exam no obvious facial droop, speech seems clear when he talks. Skin:  Skin is warm, dry and intact. No rash noted. Psychiatric: Mood and affect are normal. Speech and behavior are normal.  ____________________________________________   LABS (all labs ordered are listed, but only abnormal results are displayed)  Labs Reviewed - No data to display  Pertinent labs  results that were available during my care of the patient were reviewed by me and considered in my medical decision making (see chart for  details). ____________________________________________  EKG  I personally interpreted any EKGs ordered by me or triage As per minute no acute ST elevation or depression, baseline artifact limits interpretation to some degree but no acute ischemic changes noted borderline LAD.  ____________________________________________  RADIOLOGY  Pertinent labs & imaging results that were available during my care of the patient were reviewed by me and considered in my medical decision making (see chart for details). If possible, patient and/or family made aware of any abnormal findings.  No results found. ____________________________________________    PROCEDURES  Procedure(s) performed: None  Procedures  Critical Care performed: None  ____________________________________________   INITIAL IMPRESSION / ASSESSMENT AND PLAN / ED COURSE  Pertinent labs & imaging results that were available during my care of the patient were reviewed by me and considered in my medical decision making (see chart for details).  Patient here with altered mental status, which is happened before.  Differential is broad I will check ammonia, will get a VBG as the patient is on CPAP and he may be retaining CO2 which could certainly cause these recurrent symptoms that he has had in the past, I will obtain CT scan of the head, to rule out head bleed and evaluate for possible ischemic stroke although low suspicion that we will see anything on CT given his history and exam, we will check blood work and urinalysis to see if there is any other possible cause, given his history, pretty much anything, including UTI, electrolyte imbalance, pneumonia etc. could cause him to have an appearance like this so the differential is quite broad.  Per report sugar was normal by EMS but we will recheck that.  ----------------------------------------- 10:43 AM on 03/29/2017 -----------------------------------------  Patient back to his  baseline according to family awake and alert knows the date knows where he is, would like to eat and go home has had extensive workup for this in the past does have outpatient neurology follow-up, family and he or adamant that they would prefer to go home than be admitted.  Unclear exactly why he was somewhat confused this morning certainly could have been a TIA, however it does seem to be that there is been multiple other episodes like this.  Nonetheless he has been admitted for it without further resolution and as he is at his baseline he has good outpatient follow-up family  would like to go home.  Will discuss with neurology on-call.  ----------------------------------------- 11:18 AM on 03/29/2017 -----------------------------------------  Patient is awake alert, eating and drinking passes swallow test and is at baseline.  Family would like to take him home.  I talked to Dr. Doy Mince, she did investigate his comprehensive workup during his prior visit including EEG ., he has had negative carotids, echo, and does have a history of TIAs.  She agrees with discharge at this point he is already on Plavix and aspirin.  I have suggested to the family that they consider another sleep study as these events tend to happen at night where he wakes up confused and gets gradually better.  He is on CPAP.  I suggested they invest in a portable pulse ox machine and talk to her primary care doctor about another sleep study.  Patient and family are aware that there is a limit to the degree to which I can evaluate the patient if they go home but they are adamant that they would like to go home and I do not think that is unreasonable.  Extensive return precautions are given and understood.  At this time there is nothing to suggest that the patient is having ongoing CVA, his NIH stroke scale is 0, there is nothing to suggest meningitis or infectious process and there is no evidence of pneumonia etc.  Patient has a benign exam  at this time is eating graham crackers and requesting discharge.    ____________________________________________   FINAL CLINICAL IMPRESSION(S) / ED DIAGNOSES  Final diagnoses:  None      This chart was dictated using voice recognition software.  Despite best efforts to proofread,  errors can occur which can change meaning.      Schuyler Amor, MD 03/29/17 9675    Schuyler Amor, MD 03/29/17 9163    Schuyler Amor, MD 03/29/17 1044    Schuyler Amor, MD 03/29/17 [id number]

## 2017-04-01 DIAGNOSIS — E538 Deficiency of other specified B group vitamins: Secondary | ICD-10-CM | POA: Diagnosis not present

## 2017-04-01 DIAGNOSIS — Z79899 Other long term (current) drug therapy: Secondary | ICD-10-CM | POA: Diagnosis not present

## 2017-04-01 DIAGNOSIS — E119 Type 2 diabetes mellitus without complications: Secondary | ICD-10-CM | POA: Diagnosis not present

## 2017-04-01 DIAGNOSIS — E039 Hypothyroidism, unspecified: Secondary | ICD-10-CM | POA: Diagnosis not present

## 2017-04-08 DIAGNOSIS — R351 Nocturia: Secondary | ICD-10-CM | POA: Diagnosis not present

## 2017-04-08 DIAGNOSIS — E7800 Pure hypercholesterolemia, unspecified: Secondary | ICD-10-CM | POA: Diagnosis not present

## 2017-04-08 DIAGNOSIS — Z79899 Other long term (current) drug therapy: Secondary | ICD-10-CM | POA: Diagnosis not present

## 2017-04-08 DIAGNOSIS — N401 Enlarged prostate with lower urinary tract symptoms: Secondary | ICD-10-CM | POA: Diagnosis not present

## 2017-04-08 DIAGNOSIS — E1165 Type 2 diabetes mellitus with hyperglycemia: Secondary | ICD-10-CM | POA: Diagnosis not present

## 2017-04-08 DIAGNOSIS — E039 Hypothyroidism, unspecified: Secondary | ICD-10-CM | POA: Diagnosis not present

## 2017-04-08 DIAGNOSIS — G4733 Obstructive sleep apnea (adult) (pediatric): Secondary | ICD-10-CM | POA: Diagnosis not present

## 2017-04-08 DIAGNOSIS — I679 Cerebrovascular disease, unspecified: Secondary | ICD-10-CM | POA: Diagnosis not present

## 2017-04-08 DIAGNOSIS — Z9989 Dependence on other enabling machines and devices: Secondary | ICD-10-CM | POA: Diagnosis not present

## 2017-04-12 DIAGNOSIS — G4733 Obstructive sleep apnea (adult) (pediatric): Secondary | ICD-10-CM | POA: Diagnosis not present

## 2017-04-18 DIAGNOSIS — G4733 Obstructive sleep apnea (adult) (pediatric): Secondary | ICD-10-CM | POA: Diagnosis not present

## 2017-04-18 DIAGNOSIS — E538 Deficiency of other specified B group vitamins: Secondary | ICD-10-CM | POA: Diagnosis not present

## 2017-04-18 DIAGNOSIS — G459 Transient cerebral ischemic attack, unspecified: Secondary | ICD-10-CM | POA: Diagnosis not present

## 2017-04-18 DIAGNOSIS — I6381 Other cerebral infarction due to occlusion or stenosis of small artery: Secondary | ICD-10-CM | POA: Diagnosis not present

## 2017-04-18 DIAGNOSIS — F0150 Vascular dementia without behavioral disturbance: Secondary | ICD-10-CM | POA: Diagnosis not present

## 2017-04-18 DIAGNOSIS — G309 Alzheimer's disease, unspecified: Secondary | ICD-10-CM | POA: Diagnosis not present

## 2017-04-18 DIAGNOSIS — G5603 Carpal tunnel syndrome, bilateral upper limbs: Secondary | ICD-10-CM | POA: Diagnosis not present

## 2017-04-18 DIAGNOSIS — M21371 Foot drop, right foot: Secondary | ICD-10-CM | POA: Diagnosis not present

## 2017-04-18 DIAGNOSIS — G608 Other hereditary and idiopathic neuropathies: Secondary | ICD-10-CM | POA: Diagnosis not present

## 2017-04-28 DIAGNOSIS — G4733 Obstructive sleep apnea (adult) (pediatric): Secondary | ICD-10-CM | POA: Diagnosis not present

## 2017-05-12 DIAGNOSIS — G4733 Obstructive sleep apnea (adult) (pediatric): Secondary | ICD-10-CM | POA: Diagnosis not present

## 2017-06-11 IMAGING — CT CT HEAD W/O CM
3 series · 15 of 47 positions shown, 18 images · non-contrast
Comparison: None.

CLINICAL DATA: Altered mental status today, confusion and lethargy.
Fell getting out of car tonight, struck head on concrete. On anti
coagulation. No loss of consciousness. RIGHT forehead abrasion.

EXAM:
CT HEAD WITHOUT CONTRAST
TECHNIQUE: Contiguous axial images were obtained from the base of the skull
through the vertex without intravenous contrast.

[Series 2: head wo · axial · 0.45mm/px · z∈[-120,+5]mm · 9 of 31 slices shown, 12 images]
[im 3/31  brain]
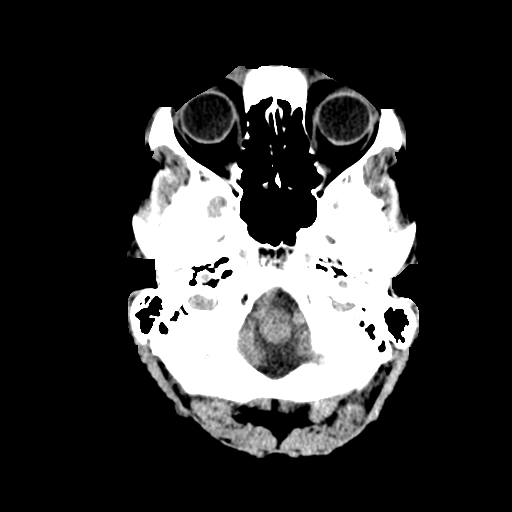
[im 3/31  bone]
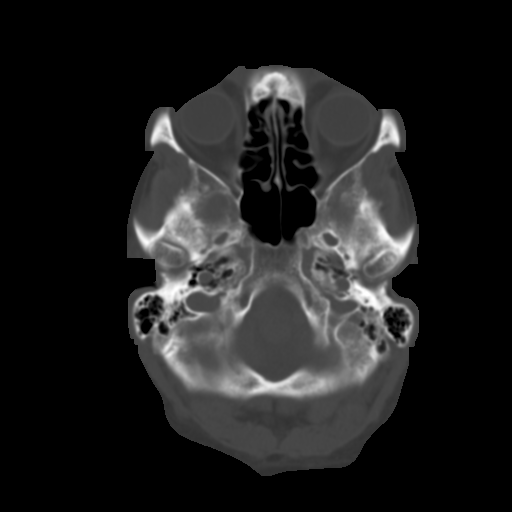
[im 6/31  brain]
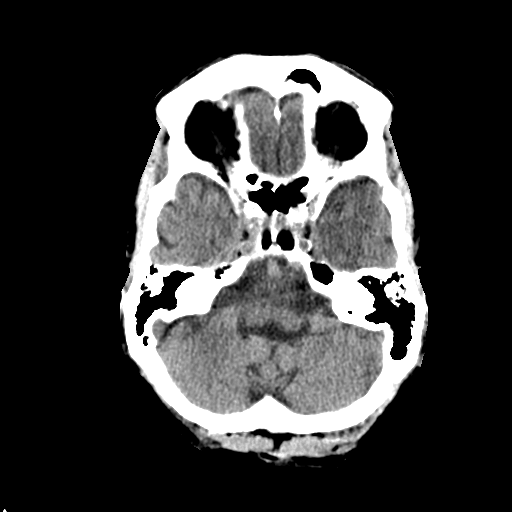
[im 9/31  brain]
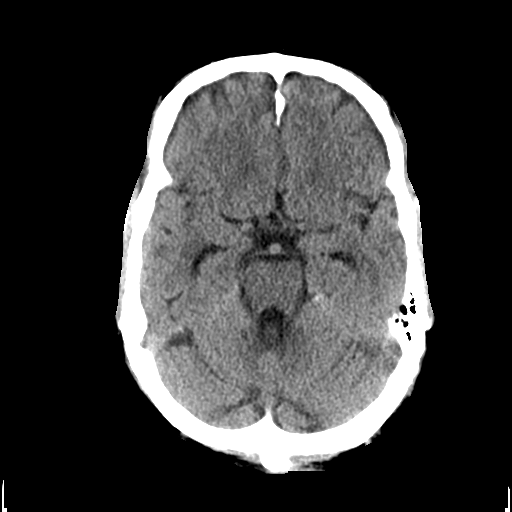
[im 12/31  brain]
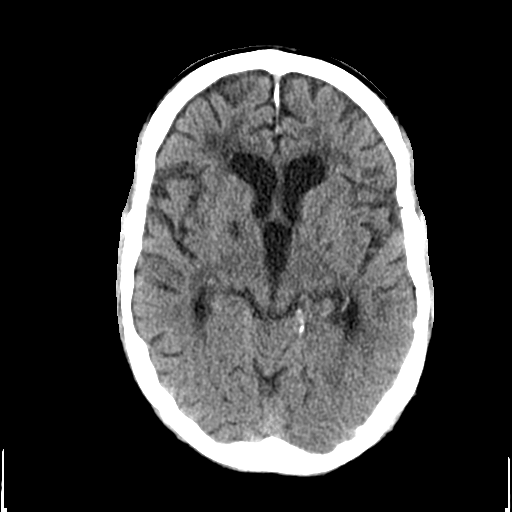
[im 16/31  brain]
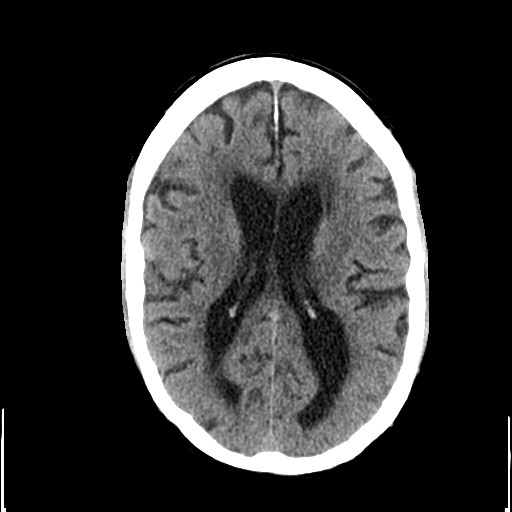
[im 16/31  bone]
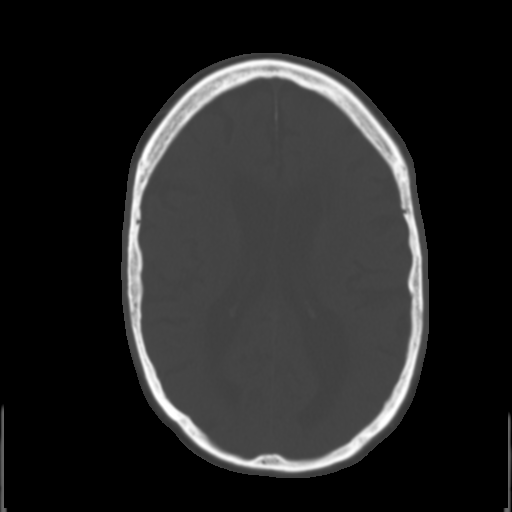
[im 19/31  brain]
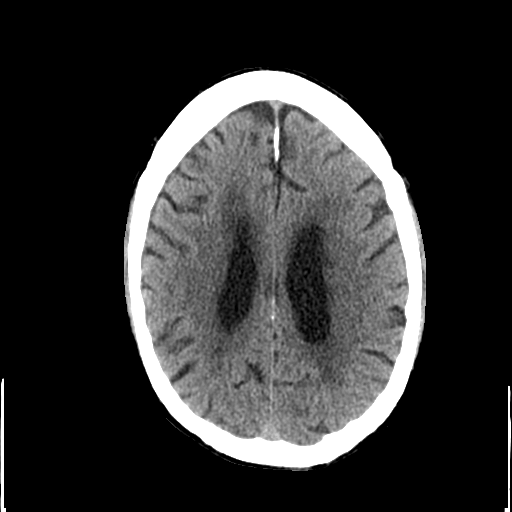
[im 22/31  brain]
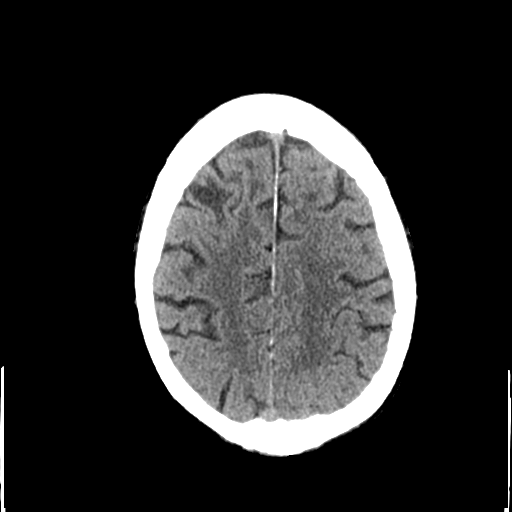
[im 25/31  brain]
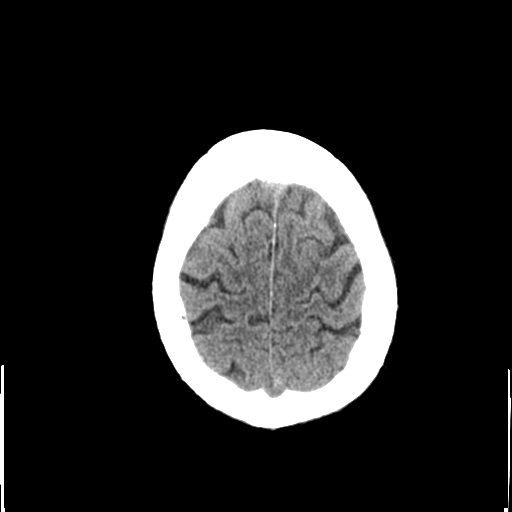
[im 28/31  brain]
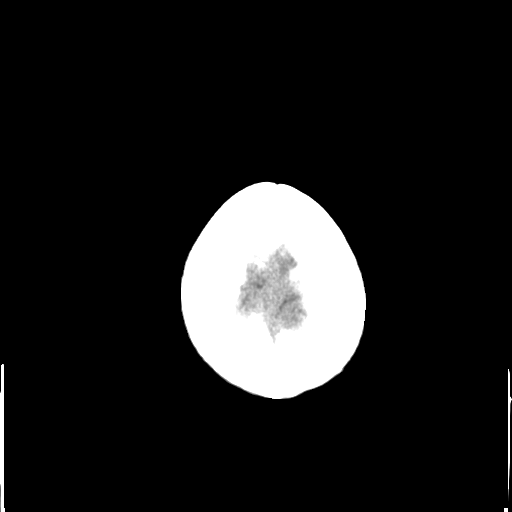
[im 28/31  bone]
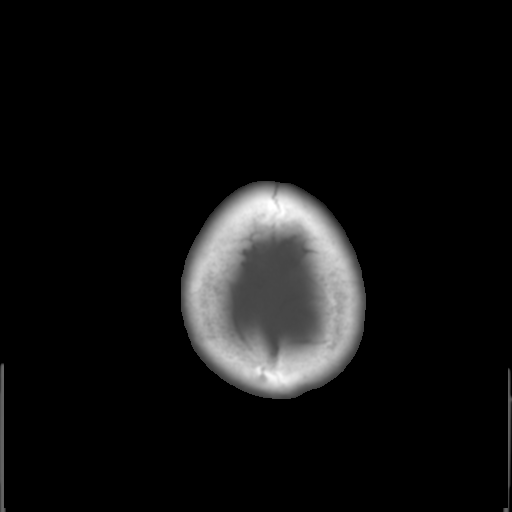

[Series 4: coronal soft tissue · coronal · 0.32mm/px · 3 of 64 slices shown]
[im 22/64  brain]
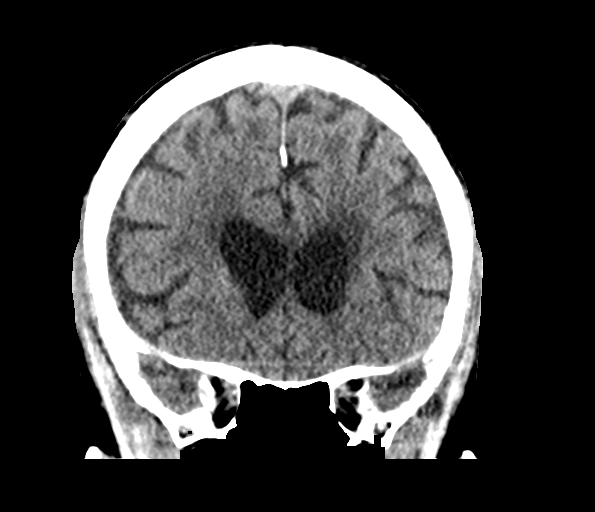
[im 29/64  brain]
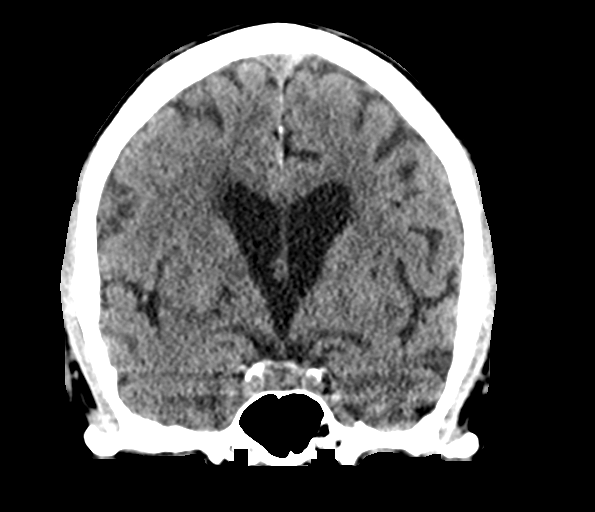
[im 36/64  brain]
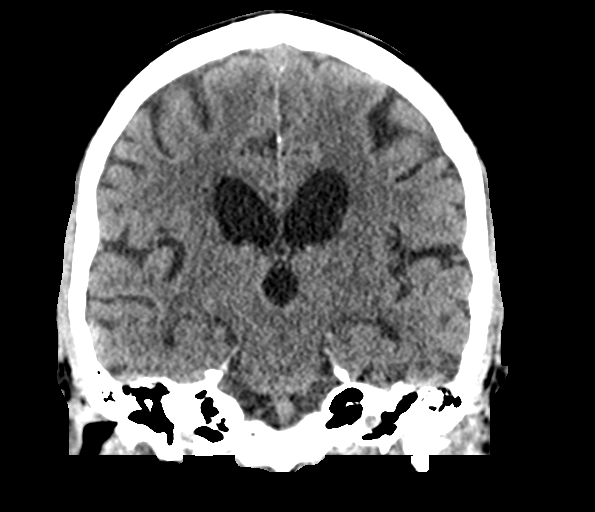

[Series 5: sagittal soft tissue · sagittal · 0.32mm/px · 3 of 52 slices shown]
[im 18/52  brain]
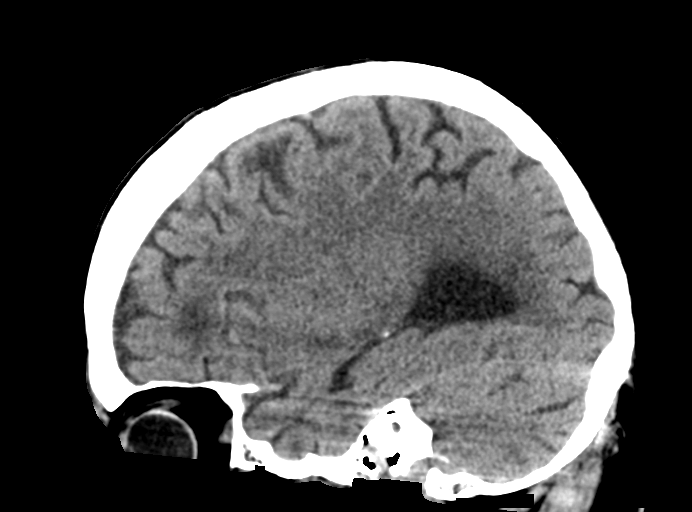
[im 26/52  brain]
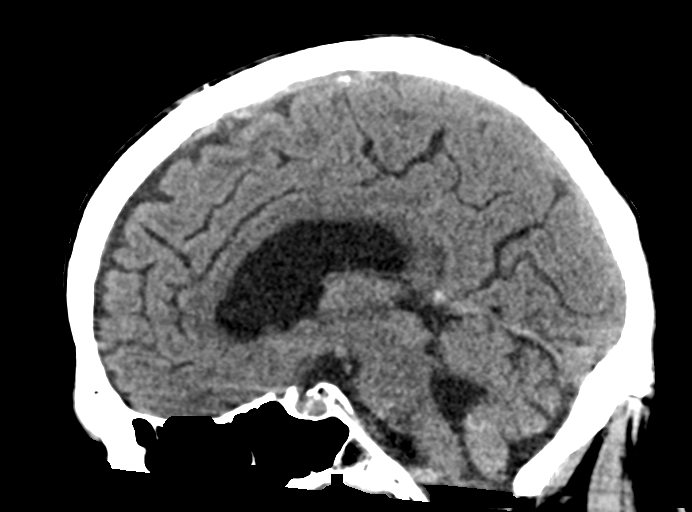
[im 35/52  brain]
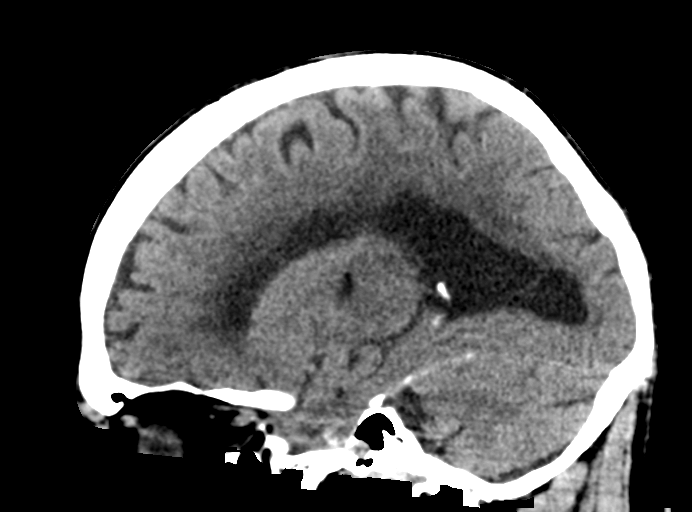

[15 of 47 positions shown; findings below may reference images not displayed]

FINDINGS: BRAIN: The ventricles and sulci are normal for age. No
intraparenchymal hemorrhage, mass effect nor midline shift. Patchy
supratentorial white matter hypodensities within normal range for
patient's age, though non-specific are most compatible with chronic
small vessel ischemic disease. Small area RIGHT frontal
encephalomalacia. Old bilateral basal ganglia lacunar infarcts. No
acute large vascular territory infarcts. No abnormal extra-axial
fluid collections. Basal cisterns are patent.

VASCULAR: Moderate calcific atherosclerosis of the carotid siphons.

SKULL: No skull fracture, osteopenia. No significant scalp soft
tissue swelling. Small RIGHT frontal scalp lipoma.

SINUSES/ORBITS: The mastoid air-cells and included paranasal sinuses
are well-aerated. Status post bilateral ocular lens implants. The
included ocular globes and orbital contents are non-suspicious.

OTHER: None.
IMPRESSION: No acute intracranial process.

Chronic changes include old RIGHT frontal lobe infarct, bilateral
basal ganglia lacunar infarcts.

## 2017-06-12 DIAGNOSIS — G4733 Obstructive sleep apnea (adult) (pediatric): Secondary | ICD-10-CM | POA: Diagnosis not present

## 2017-07-09 DIAGNOSIS — M21371 Foot drop, right foot: Secondary | ICD-10-CM | POA: Diagnosis not present

## 2017-07-09 DIAGNOSIS — G309 Alzheimer's disease, unspecified: Secondary | ICD-10-CM | POA: Diagnosis not present

## 2017-07-09 DIAGNOSIS — G4733 Obstructive sleep apnea (adult) (pediatric): Secondary | ICD-10-CM | POA: Diagnosis not present

## 2017-07-09 DIAGNOSIS — E538 Deficiency of other specified B group vitamins: Secondary | ICD-10-CM | POA: Diagnosis not present

## 2017-07-09 DIAGNOSIS — F0150 Vascular dementia without behavioral disturbance: Secondary | ICD-10-CM | POA: Diagnosis not present

## 2017-07-09 DIAGNOSIS — I6381 Other cerebral infarction due to occlusion or stenosis of small artery: Secondary | ICD-10-CM | POA: Diagnosis not present

## 2017-07-09 DIAGNOSIS — G608 Other hereditary and idiopathic neuropathies: Secondary | ICD-10-CM | POA: Diagnosis not present

## 2017-07-09 DIAGNOSIS — F0280 Dementia in other diseases classified elsewhere without behavioral disturbance: Secondary | ICD-10-CM | POA: Diagnosis not present

## 2017-07-10 DIAGNOSIS — E119 Type 2 diabetes mellitus without complications: Secondary | ICD-10-CM | POA: Diagnosis not present

## 2017-07-10 DIAGNOSIS — M810 Age-related osteoporosis without current pathological fracture: Secondary | ICD-10-CM | POA: Diagnosis not present

## 2017-07-10 DIAGNOSIS — Z794 Long term (current) use of insulin: Secondary | ICD-10-CM | POA: Diagnosis not present

## 2017-07-11 DIAGNOSIS — G4733 Obstructive sleep apnea (adult) (pediatric): Secondary | ICD-10-CM | POA: Diagnosis not present

## 2017-08-05 DIAGNOSIS — G4733 Obstructive sleep apnea (adult) (pediatric): Secondary | ICD-10-CM | POA: Diagnosis not present

## 2017-08-06 DIAGNOSIS — Z79899 Other long term (current) drug therapy: Secondary | ICD-10-CM | POA: Diagnosis not present

## 2017-08-06 DIAGNOSIS — E7800 Pure hypercholesterolemia, unspecified: Secondary | ICD-10-CM | POA: Diagnosis not present

## 2017-08-06 DIAGNOSIS — E039 Hypothyroidism, unspecified: Secondary | ICD-10-CM | POA: Diagnosis not present

## 2017-08-06 DIAGNOSIS — E1165 Type 2 diabetes mellitus with hyperglycemia: Secondary | ICD-10-CM | POA: Diagnosis not present

## 2017-08-09 DIAGNOSIS — Z0000 Encounter for general adult medical examination without abnormal findings: Secondary | ICD-10-CM | POA: Diagnosis not present

## 2017-08-10 DIAGNOSIS — G4733 Obstructive sleep apnea (adult) (pediatric): Secondary | ICD-10-CM | POA: Diagnosis not present

## 2017-09-10 DIAGNOSIS — G4733 Obstructive sleep apnea (adult) (pediatric): Secondary | ICD-10-CM | POA: Diagnosis not present

## 2017-09-16 DIAGNOSIS — M810 Age-related osteoporosis without current pathological fracture: Secondary | ICD-10-CM | POA: Diagnosis not present

## 2017-10-03 DIAGNOSIS — R410 Disorientation, unspecified: Secondary | ICD-10-CM | POA: Diagnosis not present

## 2017-10-03 DIAGNOSIS — G4733 Obstructive sleep apnea (adult) (pediatric): Secondary | ICD-10-CM | POA: Diagnosis not present

## 2017-10-03 DIAGNOSIS — E538 Deficiency of other specified B group vitamins: Secondary | ICD-10-CM | POA: Diagnosis not present

## 2017-10-03 DIAGNOSIS — G608 Other hereditary and idiopathic neuropathies: Secondary | ICD-10-CM | POA: Diagnosis not present

## 2017-10-03 DIAGNOSIS — G5603 Carpal tunnel syndrome, bilateral upper limbs: Secondary | ICD-10-CM | POA: Diagnosis not present

## 2017-10-03 DIAGNOSIS — I6381 Other cerebral infarction due to occlusion or stenosis of small artery: Secondary | ICD-10-CM | POA: Diagnosis not present

## 2017-10-03 DIAGNOSIS — G459 Transient cerebral ischemic attack, unspecified: Secondary | ICD-10-CM | POA: Diagnosis not present

## 2017-10-03 DIAGNOSIS — M21371 Foot drop, right foot: Secondary | ICD-10-CM | POA: Diagnosis not present

## 2017-10-03 DIAGNOSIS — G309 Alzheimer's disease, unspecified: Secondary | ICD-10-CM | POA: Diagnosis not present

## 2017-10-08 ENCOUNTER — Other Ambulatory Visit: Payer: Self-pay | Admitting: Neurology

## 2017-10-08 DIAGNOSIS — R410 Disorientation, unspecified: Secondary | ICD-10-CM

## 2017-10-18 ENCOUNTER — Ambulatory Visit
Admission: RE | Admit: 2017-10-18 | Discharge: 2017-10-18 | Disposition: A | Discharge status: Home / Self Care | Payer: Medicare HMO | Source: Ambulatory Visit | Attending: Neurology | Admitting: Neurology

## 2017-10-18 DIAGNOSIS — E785 Hyperlipidemia, unspecified: Secondary | ICD-10-CM | POA: Insufficient documentation

## 2017-10-18 DIAGNOSIS — R410 Disorientation, unspecified: Secondary | ICD-10-CM | POA: Diagnosis not present

## 2017-10-18 DIAGNOSIS — F0150 Vascular dementia without behavioral disturbance: Secondary | ICD-10-CM | POA: Insufficient documentation

## 2017-10-18 DIAGNOSIS — F0280 Dementia in other diseases classified elsewhere without behavioral disturbance: Secondary | ICD-10-CM | POA: Diagnosis not present

## 2017-10-18 DIAGNOSIS — I10 Essential (primary) hypertension: Secondary | ICD-10-CM | POA: Diagnosis not present

## 2017-10-18 DIAGNOSIS — E119 Type 2 diabetes mellitus without complications: Secondary | ICD-10-CM | POA: Diagnosis not present

## 2017-10-18 DIAGNOSIS — G309 Alzheimer's disease, unspecified: Secondary | ICD-10-CM | POA: Insufficient documentation

## 2017-10-18 LAB — POCT I-STAT CREATININE: Creatinine, Ser: 0.8 mg/dL (ref 0.61–1.24)

## 2017-10-18 MED ORDER — GADOBENATE DIMEGLUMINE 529 MG/ML IV SOLN
15.0000 mL | Freq: Once | INTRAVENOUS | Status: AC | PRN
Start: 2017-10-18 — End: 2017-10-18
  Administered 2017-10-18: 15 mL via INTRAVENOUS

## 2017-10-22 DIAGNOSIS — G309 Alzheimer's disease, unspecified: Secondary | ICD-10-CM | POA: Diagnosis not present

## 2017-10-22 DIAGNOSIS — F0280 Dementia in other diseases classified elsewhere without behavioral disturbance: Secondary | ICD-10-CM | POA: Diagnosis not present

## 2017-10-22 DIAGNOSIS — M21371 Foot drop, right foot: Secondary | ICD-10-CM | POA: Diagnosis not present

## 2017-10-22 DIAGNOSIS — G4733 Obstructive sleep apnea (adult) (pediatric): Secondary | ICD-10-CM | POA: Diagnosis not present

## 2017-10-22 DIAGNOSIS — F0150 Vascular dementia without behavioral disturbance: Secondary | ICD-10-CM | POA: Diagnosis not present

## 2017-10-22 DIAGNOSIS — R269 Unspecified abnormalities of gait and mobility: Secondary | ICD-10-CM | POA: Diagnosis not present

## 2017-10-22 DIAGNOSIS — I6381 Other cerebral infarction due to occlusion or stenosis of small artery: Secondary | ICD-10-CM | POA: Diagnosis not present

## 2017-10-28 DIAGNOSIS — Z9181 History of falling: Secondary | ICD-10-CM | POA: Diagnosis not present

## 2017-10-28 DIAGNOSIS — E1165 Type 2 diabetes mellitus with hyperglycemia: Secondary | ICD-10-CM | POA: Diagnosis not present

## 2017-10-28 DIAGNOSIS — F0390 Unspecified dementia without behavioral disturbance: Secondary | ICD-10-CM | POA: Diagnosis not present

## 2017-10-28 DIAGNOSIS — Z8673 Personal history of transient ischemic attack (TIA), and cerebral infarction without residual deficits: Secondary | ICD-10-CM | POA: Diagnosis not present

## 2017-10-28 DIAGNOSIS — G5603 Carpal tunnel syndrome, bilateral upper limbs: Secondary | ICD-10-CM | POA: Diagnosis not present

## 2017-10-28 DIAGNOSIS — G6289 Other specified polyneuropathies: Secondary | ICD-10-CM | POA: Diagnosis not present

## 2017-10-28 DIAGNOSIS — I6782 Cerebral ischemia: Secondary | ICD-10-CM | POA: Diagnosis not present

## 2017-10-28 DIAGNOSIS — I10 Essential (primary) hypertension: Secondary | ICD-10-CM | POA: Diagnosis not present

## 2017-10-28 DIAGNOSIS — G9389 Other specified disorders of brain: Secondary | ICD-10-CM | POA: Diagnosis not present

## 2017-10-29 DIAGNOSIS — G5603 Carpal tunnel syndrome, bilateral upper limbs: Secondary | ICD-10-CM | POA: Diagnosis not present

## 2017-10-29 DIAGNOSIS — G6289 Other specified polyneuropathies: Secondary | ICD-10-CM | POA: Diagnosis not present

## 2017-10-29 DIAGNOSIS — F0390 Unspecified dementia without behavioral disturbance: Secondary | ICD-10-CM | POA: Diagnosis not present

## 2017-10-29 DIAGNOSIS — Z9181 History of falling: Secondary | ICD-10-CM | POA: Diagnosis not present

## 2017-10-29 DIAGNOSIS — I6782 Cerebral ischemia: Secondary | ICD-10-CM | POA: Diagnosis not present

## 2017-10-29 DIAGNOSIS — I10 Essential (primary) hypertension: Secondary | ICD-10-CM | POA: Diagnosis not present

## 2017-10-29 DIAGNOSIS — E1165 Type 2 diabetes mellitus with hyperglycemia: Secondary | ICD-10-CM | POA: Diagnosis not present

## 2017-10-29 DIAGNOSIS — G9389 Other specified disorders of brain: Secondary | ICD-10-CM | POA: Diagnosis not present

## 2017-10-29 DIAGNOSIS — Z8673 Personal history of transient ischemic attack (TIA), and cerebral infarction without residual deficits: Secondary | ICD-10-CM | POA: Diagnosis not present

## 2017-10-30 DIAGNOSIS — G4733 Obstructive sleep apnea (adult) (pediatric): Secondary | ICD-10-CM | POA: Diagnosis not present

## 2017-10-31 DIAGNOSIS — Z8673 Personal history of transient ischemic attack (TIA), and cerebral infarction without residual deficits: Secondary | ICD-10-CM | POA: Diagnosis not present

## 2017-10-31 DIAGNOSIS — G6289 Other specified polyneuropathies: Secondary | ICD-10-CM | POA: Diagnosis not present

## 2017-10-31 DIAGNOSIS — G5603 Carpal tunnel syndrome, bilateral upper limbs: Secondary | ICD-10-CM | POA: Diagnosis not present

## 2017-10-31 DIAGNOSIS — I6782 Cerebral ischemia: Secondary | ICD-10-CM | POA: Diagnosis not present

## 2017-10-31 DIAGNOSIS — I10 Essential (primary) hypertension: Secondary | ICD-10-CM | POA: Diagnosis not present

## 2017-10-31 DIAGNOSIS — G9389 Other specified disorders of brain: Secondary | ICD-10-CM | POA: Diagnosis not present

## 2017-10-31 DIAGNOSIS — F0390 Unspecified dementia without behavioral disturbance: Secondary | ICD-10-CM | POA: Diagnosis not present

## 2017-10-31 DIAGNOSIS — E1165 Type 2 diabetes mellitus with hyperglycemia: Secondary | ICD-10-CM | POA: Diagnosis not present

## 2017-10-31 DIAGNOSIS — Z9181 History of falling: Secondary | ICD-10-CM | POA: Diagnosis not present

## 2017-11-04 DIAGNOSIS — Z8673 Personal history of transient ischemic attack (TIA), and cerebral infarction without residual deficits: Secondary | ICD-10-CM | POA: Diagnosis not present

## 2017-11-04 DIAGNOSIS — I6782 Cerebral ischemia: Secondary | ICD-10-CM | POA: Diagnosis not present

## 2017-11-04 DIAGNOSIS — G5603 Carpal tunnel syndrome, bilateral upper limbs: Secondary | ICD-10-CM | POA: Diagnosis not present

## 2017-11-04 DIAGNOSIS — F0390 Unspecified dementia without behavioral disturbance: Secondary | ICD-10-CM | POA: Diagnosis not present

## 2017-11-04 DIAGNOSIS — G6289 Other specified polyneuropathies: Secondary | ICD-10-CM | POA: Diagnosis not present

## 2017-11-04 DIAGNOSIS — Z9181 History of falling: Secondary | ICD-10-CM | POA: Diagnosis not present

## 2017-11-04 DIAGNOSIS — I10 Essential (primary) hypertension: Secondary | ICD-10-CM | POA: Diagnosis not present

## 2017-11-04 DIAGNOSIS — G9389 Other specified disorders of brain: Secondary | ICD-10-CM | POA: Diagnosis not present

## 2017-11-04 DIAGNOSIS — E1165 Type 2 diabetes mellitus with hyperglycemia: Secondary | ICD-10-CM | POA: Diagnosis not present

## 2017-11-05 DIAGNOSIS — Z794 Long term (current) use of insulin: Secondary | ICD-10-CM | POA: Diagnosis not present

## 2017-11-05 DIAGNOSIS — G5603 Carpal tunnel syndrome, bilateral upper limbs: Secondary | ICD-10-CM | POA: Diagnosis not present

## 2017-11-05 DIAGNOSIS — Z8673 Personal history of transient ischemic attack (TIA), and cerebral infarction without residual deficits: Secondary | ICD-10-CM | POA: Diagnosis not present

## 2017-11-05 DIAGNOSIS — I10 Essential (primary) hypertension: Secondary | ICD-10-CM | POA: Diagnosis not present

## 2017-11-05 DIAGNOSIS — I6782 Cerebral ischemia: Secondary | ICD-10-CM | POA: Diagnosis not present

## 2017-11-05 DIAGNOSIS — F0390 Unspecified dementia without behavioral disturbance: Secondary | ICD-10-CM | POA: Diagnosis not present

## 2017-11-05 DIAGNOSIS — E119 Type 2 diabetes mellitus without complications: Secondary | ICD-10-CM | POA: Diagnosis not present

## 2017-11-05 DIAGNOSIS — E1165 Type 2 diabetes mellitus with hyperglycemia: Secondary | ICD-10-CM | POA: Diagnosis not present

## 2017-11-05 DIAGNOSIS — Z9181 History of falling: Secondary | ICD-10-CM | POA: Diagnosis not present

## 2017-11-05 DIAGNOSIS — G6289 Other specified polyneuropathies: Secondary | ICD-10-CM | POA: Diagnosis not present

## 2017-11-05 DIAGNOSIS — G9389 Other specified disorders of brain: Secondary | ICD-10-CM | POA: Diagnosis not present

## 2017-11-07 DIAGNOSIS — G9389 Other specified disorders of brain: Secondary | ICD-10-CM | POA: Diagnosis not present

## 2017-11-07 DIAGNOSIS — G5603 Carpal tunnel syndrome, bilateral upper limbs: Secondary | ICD-10-CM | POA: Diagnosis not present

## 2017-11-07 DIAGNOSIS — Z8673 Personal history of transient ischemic attack (TIA), and cerebral infarction without residual deficits: Secondary | ICD-10-CM | POA: Diagnosis not present

## 2017-11-07 DIAGNOSIS — F0390 Unspecified dementia without behavioral disturbance: Secondary | ICD-10-CM | POA: Diagnosis not present

## 2017-11-07 DIAGNOSIS — G6289 Other specified polyneuropathies: Secondary | ICD-10-CM | POA: Diagnosis not present

## 2017-11-07 DIAGNOSIS — I10 Essential (primary) hypertension: Secondary | ICD-10-CM | POA: Diagnosis not present

## 2017-11-07 DIAGNOSIS — E1165 Type 2 diabetes mellitus with hyperglycemia: Secondary | ICD-10-CM | POA: Diagnosis not present

## 2017-11-07 DIAGNOSIS — I6782 Cerebral ischemia: Secondary | ICD-10-CM | POA: Diagnosis not present

## 2017-11-07 DIAGNOSIS — Z9181 History of falling: Secondary | ICD-10-CM | POA: Diagnosis not present

## 2017-11-11 DIAGNOSIS — E1165 Type 2 diabetes mellitus with hyperglycemia: Secondary | ICD-10-CM | POA: Diagnosis not present

## 2017-11-11 DIAGNOSIS — F0390 Unspecified dementia without behavioral disturbance: Secondary | ICD-10-CM | POA: Diagnosis not present

## 2017-11-11 DIAGNOSIS — Z9181 History of falling: Secondary | ICD-10-CM | POA: Diagnosis not present

## 2017-11-11 DIAGNOSIS — I6782 Cerebral ischemia: Secondary | ICD-10-CM | POA: Diagnosis not present

## 2017-11-11 DIAGNOSIS — G5603 Carpal tunnel syndrome, bilateral upper limbs: Secondary | ICD-10-CM | POA: Diagnosis not present

## 2017-11-11 DIAGNOSIS — G6289 Other specified polyneuropathies: Secondary | ICD-10-CM | POA: Diagnosis not present

## 2017-11-11 DIAGNOSIS — G9389 Other specified disorders of brain: Secondary | ICD-10-CM | POA: Diagnosis not present

## 2017-11-11 DIAGNOSIS — Z8673 Personal history of transient ischemic attack (TIA), and cerebral infarction without residual deficits: Secondary | ICD-10-CM | POA: Diagnosis not present

## 2017-11-11 DIAGNOSIS — I10 Essential (primary) hypertension: Secondary | ICD-10-CM | POA: Diagnosis not present

## 2017-11-12 ENCOUNTER — Emergency Department: Payer: Medicare Other

## 2017-11-12 ENCOUNTER — Inpatient Hospital Stay
Admit: 2017-11-12 | Discharge: 2017-11-12 | Disposition: A | Discharge status: Home / Self Care | Payer: Medicare Other | Attending: Internal Medicine | Admitting: Internal Medicine

## 2017-11-12 ENCOUNTER — Other Ambulatory Visit: Payer: Self-pay

## 2017-11-12 ENCOUNTER — Inpatient Hospital Stay: Payer: Medicare Other

## 2017-11-12 ENCOUNTER — Inpatient Hospital Stay
Admission: EM | Admit: 2017-11-12 | Discharge: 2017-11-13 | DRG: 069 | Disposition: A | Discharge status: Home Health | Payer: Medicare Other | Source: Skilled Nursing Facility | Attending: Internal Medicine | Admitting: Internal Medicine

## 2017-11-12 ENCOUNTER — Encounter: Payer: Self-pay | Admitting: Emergency Medicine

## 2017-11-12 DIAGNOSIS — I639 Cerebral infarction, unspecified: Secondary | ICD-10-CM | POA: Diagnosis not present

## 2017-11-12 DIAGNOSIS — Z79890 Hormone replacement therapy (postmenopausal): Secondary | ICD-10-CM | POA: Diagnosis not present

## 2017-11-12 DIAGNOSIS — R479 Unspecified speech disturbances: Secondary | ICD-10-CM | POA: Diagnosis not present

## 2017-11-12 DIAGNOSIS — Z7982 Long term (current) use of aspirin: Secondary | ICD-10-CM | POA: Diagnosis not present

## 2017-11-12 DIAGNOSIS — G9349 Other encephalopathy: Secondary | ICD-10-CM | POA: Diagnosis present

## 2017-11-12 DIAGNOSIS — E039 Hypothyroidism, unspecified: Secondary | ICD-10-CM | POA: Diagnosis not present

## 2017-11-12 DIAGNOSIS — Z823 Family history of stroke: Secondary | ICD-10-CM

## 2017-11-12 DIAGNOSIS — R29818 Other symptoms and signs involving the nervous system: Secondary | ICD-10-CM | POA: Diagnosis not present

## 2017-11-12 DIAGNOSIS — I672 Cerebral atherosclerosis: Secondary | ICD-10-CM | POA: Diagnosis not present

## 2017-11-12 DIAGNOSIS — E785 Hyperlipidemia, unspecified: Secondary | ICD-10-CM | POA: Diagnosis not present

## 2017-11-12 DIAGNOSIS — R4701 Aphasia: Secondary | ICD-10-CM | POA: Diagnosis not present

## 2017-11-12 DIAGNOSIS — Z96643 Presence of artificial hip joint, bilateral: Secondary | ICD-10-CM | POA: Diagnosis present

## 2017-11-12 DIAGNOSIS — I6523 Occlusion and stenosis of bilateral carotid arteries: Secondary | ICD-10-CM | POA: Diagnosis not present

## 2017-11-12 DIAGNOSIS — N400 Enlarged prostate without lower urinary tract symptoms: Secondary | ICD-10-CM | POA: Diagnosis present

## 2017-11-12 DIAGNOSIS — I6389 Other cerebral infarction: Secondary | ICD-10-CM | POA: Diagnosis not present

## 2017-11-12 DIAGNOSIS — Z794 Long term (current) use of insulin: Secondary | ICD-10-CM

## 2017-11-12 DIAGNOSIS — M21372 Foot drop, left foot: Secondary | ICD-10-CM | POA: Diagnosis present

## 2017-11-12 DIAGNOSIS — Z66 Do not resuscitate: Secondary | ICD-10-CM | POA: Diagnosis not present

## 2017-11-12 DIAGNOSIS — E119 Type 2 diabetes mellitus without complications: Secondary | ICD-10-CM | POA: Diagnosis not present

## 2017-11-12 DIAGNOSIS — E1140 Type 2 diabetes mellitus with diabetic neuropathy, unspecified: Secondary | ICD-10-CM | POA: Diagnosis not present

## 2017-11-12 DIAGNOSIS — G459 Transient cerebral ischemic attack, unspecified: Principal | ICD-10-CM | POA: Diagnosis present

## 2017-11-12 DIAGNOSIS — M810 Age-related osteoporosis without current pathological fracture: Secondary | ICD-10-CM | POA: Diagnosis present

## 2017-11-12 DIAGNOSIS — Z96652 Presence of left artificial knee joint: Secondary | ICD-10-CM | POA: Diagnosis not present

## 2017-11-12 DIAGNOSIS — I6300 Cerebral infarction due to thrombosis of unspecified precerebral artery: Secondary | ICD-10-CM

## 2017-11-12 DIAGNOSIS — F0390 Unspecified dementia without behavioral disturbance: Secondary | ICD-10-CM | POA: Diagnosis not present

## 2017-11-12 DIAGNOSIS — R4182 Altered mental status, unspecified: Secondary | ICD-10-CM | POA: Diagnosis not present

## 2017-11-12 DIAGNOSIS — I10 Essential (primary) hypertension: Secondary | ICD-10-CM | POA: Diagnosis present

## 2017-11-12 DIAGNOSIS — Z7902 Long term (current) use of antithrombotics/antiplatelets: Secondary | ICD-10-CM | POA: Diagnosis not present

## 2017-11-12 DIAGNOSIS — G9340 Encephalopathy, unspecified: Secondary | ICD-10-CM | POA: Diagnosis not present

## 2017-11-12 LAB — COMPREHENSIVE METABOLIC PANEL
ALK PHOS: 44 U/L (ref 38–126)
ALT: 15 U/L (ref 0–44)
AST: 32 U/L (ref 15–41)
Albumin: 4 g/dL (ref 3.5–5.0)
Anion gap: 9 (ref 5–15)
BILIRUBIN TOTAL: 1 mg/dL (ref 0.3–1.2)
BUN: 15 mg/dL (ref 8–23)
CALCIUM: 8.9 mg/dL (ref 8.9–10.3)
CO2: 28 mmol/L (ref 22–32)
CREATININE: 0.72 mg/dL (ref 0.61–1.24)
Chloride: 93 mmol/L — ABNORMAL LOW (ref 98–111)
Glucose, Bld: 174 mg/dL — ABNORMAL HIGH (ref 70–99)
Potassium: 4.7 mmol/L (ref 3.5–5.1)
Sodium: 130 mmol/L — ABNORMAL LOW (ref 135–145)
Total Protein: 6.7 g/dL (ref 6.5–8.1)

## 2017-11-12 LAB — CBC
HEMATOCRIT: 34.4 % — AB (ref 40.0–52.0)
HEMOGLOBIN: 11.9 g/dL — AB (ref 13.0–18.0)
MCH: 33 pg (ref 26.0–34.0)
MCHC: 34.7 g/dL (ref 32.0–36.0)
MCV: 95 fL (ref 80.0–100.0)
Platelets: 183 10*3/uL (ref 150–440)
RBC: 3.62 MIL/uL — AB (ref 4.40–5.90)
RDW: 13.3 % (ref 11.5–14.5)
WBC: 5.5 10*3/uL (ref 3.8–10.6)

## 2017-11-12 LAB — URINE DRUG SCREEN, QUALITATIVE (ARMC ONLY)
Amphetamines, Ur Screen: NOT DETECTED
BENZODIAZEPINE, UR SCRN: NOT DETECTED
Cannabinoid 50 Ng, Ur ~~LOC~~: NOT DETECTED
Cocaine Metabolite,Ur ~~LOC~~: NOT DETECTED
MDMA (Ecstasy)Ur Screen: NOT DETECTED
METHADONE SCREEN, URINE: NOT DETECTED
Opiate, Ur Screen: NOT DETECTED
Phencyclidine (PCP) Ur S: NOT DETECTED
TRICYCLIC, UR SCREEN: NOT DETECTED

## 2017-11-12 LAB — ECHOCARDIOGRAM COMPLETE
Height: 68 in
Weight: 2680.79 oz

## 2017-11-12 LAB — DIFFERENTIAL
Basophils Absolute: 0 10*3/uL (ref 0–0.1)
Basophils Relative: 1 %
Eosinophils Absolute: 0.1 10*3/uL (ref 0–0.7)
Eosinophils Relative: 2 %
LYMPHS ABS: 0.7 10*3/uL — AB (ref 1.0–3.6)
LYMPHS PCT: 13 %
MONO ABS: 0.6 10*3/uL (ref 0.2–1.0)
MONOS PCT: 10 %
NEUTROS ABS: 4 10*3/uL (ref 1.4–6.5)
Neutrophils Relative %: 74 %

## 2017-11-12 LAB — ETHANOL: Alcohol, Ethyl (B): <10 mg/dL (ref <10)

## 2017-11-12 LAB — PROTIME-INR
INR: 0.93
Prothrombin Time: 12.4 seconds (ref 11.4–15.2)

## 2017-11-12 LAB — URINALYSIS, ROUTINE W REFLEX MICROSCOPIC
BILIRUBIN URINE: NEGATIVE
Glucose, UA: NEGATIVE mg/dL
Hgb urine dipstick: NEGATIVE
KETONES UR: 5 mg/dL — AB
LEUKOCYTES UA: NEGATIVE
NITRITE: NEGATIVE
PROTEIN: NEGATIVE mg/dL
Specific Gravity, Urine: 1.039 — ABNORMAL HIGH (ref 1.005–1.030)
pH: 9 — ABNORMAL HIGH (ref 5.0–8.0)

## 2017-11-12 LAB — GLUCOSE, CAPILLARY
GLUCOSE-CAPILLARY: 80 mg/dL (ref 70–99)
Glucose-Capillary: 194 mg/dL — ABNORMAL HIGH (ref 70–99)
Glucose-Capillary: 127 mg/dL — ABNORMAL HIGH (ref 70–99)

## 2017-11-12 LAB — TROPONIN I

## 2017-11-12 LAB — APTT: aPTT: 35 seconds (ref 24–36)

## 2017-11-12 MED ORDER — GALANTAMINE HYDROBROMIDE ER 8 MG PO CP24
8.0000 mg | ORAL_CAPSULE | Freq: Every day | ORAL | Status: DC
  Administered 2017-11-13: 8 mg via ORAL
  Filled 2017-11-12: qty 1

## 2017-11-12 MED ORDER — CALCIUM CARBONATE-VITAMIN D 500-200 MG-UNIT PO TABS
1.0000 | ORAL_TABLET | Freq: Every day | ORAL | Status: DC
  Administered 2017-11-12 – 2017-11-13 (×2): 1 via ORAL
  Filled 2017-11-12 (×2): qty 1

## 2017-11-12 MED ORDER — POLYETHYLENE GLYCOL 3350 17 G PO PACK
17.0000 g | PACK | Freq: Every day | ORAL | Status: DC
  Administered 2017-11-12 – 2017-11-13 (×2): 17 g via ORAL
  Filled 2017-11-12 (×2): qty 1

## 2017-11-12 MED ORDER — FINASTERIDE 5 MG PO TABS
5.0000 mg | ORAL_TABLET | Freq: Every day | ORAL | Status: DC
  Administered 2017-11-12 – 2017-11-13 (×2): 5 mg via ORAL
  Filled 2017-11-12 (×2): qty 1

## 2017-11-12 MED ORDER — IOPAMIDOL (ISOVUE-370) INJECTION 76%
100.0000 mL | Freq: Once | INTRAVENOUS | Status: AC | PRN
  Administered 2017-11-12: 100 mL via INTRAVENOUS

## 2017-11-12 MED ORDER — ACETAMINOPHEN 325 MG PO TABS: 650.0000 mg | ORAL_TABLET | ORAL | Status: DC | PRN

## 2017-11-12 MED ORDER — ACETAMINOPHEN 650 MG RE SUPP: 650.0000 mg | RECTAL | Status: DC | PRN

## 2017-11-12 MED ORDER — VITAMIN B-12 1000 MCG PO TABS
1000.0000 ug | ORAL_TABLET | Freq: Every day | ORAL | Status: DC
  Administered 2017-11-12 – 2017-11-13 (×2): 1000 ug via ORAL
  Filled 2017-11-12 (×2): qty 1

## 2017-11-12 MED ORDER — CLOPIDOGREL BISULFATE 75 MG PO TABS
75.0000 mg | ORAL_TABLET | Freq: Every day | ORAL | Status: DC
  Administered 2017-11-12 – 2017-11-13 (×2): 75 mg via ORAL
  Filled 2017-11-12 (×2): qty 1

## 2017-11-12 MED ORDER — LISINOPRIL 10 MG PO TABS
10.0000 mg | ORAL_TABLET | Freq: Every day | ORAL | Status: DC
  Administered 2017-11-12 – 2017-11-13 (×2): 10 mg via ORAL
  Filled 2017-11-12 (×3): qty 1

## 2017-11-12 MED ORDER — ACETAMINOPHEN 160 MG/5ML PO SOLN
650.0000 mg | ORAL | Status: DC | PRN
  Filled 2017-11-12: qty 20.3

## 2017-11-12 MED ORDER — GABAPENTIN 300 MG PO CAPS
300.0000 mg | ORAL_CAPSULE | Freq: Every day | ORAL | Status: DC
  Administered 2017-11-12: 21:00:00 300 mg via ORAL
  Filled 2017-11-12: qty 1

## 2017-11-12 MED ORDER — INSULIN ASPART 100 UNIT/ML ~~LOC~~ SOLN
0.0000 [IU] | Freq: Three times a day (TID) | SUBCUTANEOUS | Status: DC
  Administered 2017-11-12: 2 [IU] via SUBCUTANEOUS
  Administered 2017-11-13: 1 [IU] via SUBCUTANEOUS
  Filled 2017-11-12 (×2): qty 1

## 2017-11-12 MED ORDER — MECLIZINE HCL 25 MG PO TABS
25.0000 mg | ORAL_TABLET | Freq: Three times a day (TID) | ORAL | Status: DC | PRN
  Filled 2017-11-12: qty 1

## 2017-11-12 MED ORDER — TAMSULOSIN HCL 0.4 MG PO CAPS
0.4000 mg | ORAL_CAPSULE | Freq: Two times a day (BID) | ORAL | Status: DC
  Administered 2017-11-12 – 2017-11-13 (×3): 0.4 mg via ORAL
  Filled 2017-11-12 (×3): qty 1

## 2017-11-12 MED ORDER — LEVOTHYROXINE SODIUM 25 MCG PO TABS
25.0000 ug | ORAL_TABLET | Freq: Every day | ORAL | Status: DC
  Administered 2017-11-12 – 2017-11-13 (×2): 25 ug via ORAL
  Filled 2017-11-12 (×2): qty 1

## 2017-11-12 MED ORDER — ENOXAPARIN SODIUM 40 MG/0.4ML ~~LOC~~ SOLN
40.0000 mg | SUBCUTANEOUS | Status: DC
  Administered 2017-11-12: 21:00:00 40 mg via SUBCUTANEOUS
  Filled 2017-11-12: qty 0.4

## 2017-11-12 MED ORDER — STROKE: EARLY STAGES OF RECOVERY BOOK
Freq: Once | Status: AC
  Administered 2017-11-12: 18:00:00

## 2017-11-12 MED ORDER — METFORMIN HCL 500 MG PO TABS
1000.0000 mg | ORAL_TABLET | Freq: Two times a day (BID) | ORAL | Status: DC
  Administered 2017-11-12 – 2017-11-13 (×2): 1000 mg via ORAL
  Filled 2017-11-12 (×3): qty 2

## 2017-11-12 MED ORDER — ASPIRIN EC 81 MG PO TBEC
81.0000 mg | DELAYED_RELEASE_TABLET | Freq: Every day | ORAL | Status: DC
  Administered 2017-11-12 – 2017-11-13 (×2): 81 mg via ORAL
  Filled 2017-11-12 (×2): qty 1

## 2017-11-12 MED ORDER — SIMVASTATIN 20 MG PO TABS
20.0000 mg | ORAL_TABLET | Freq: Every evening | ORAL | Status: DC
  Administered 2017-11-12: 17:00:00 20 mg via ORAL
  Filled 2017-11-12: qty 1

## 2017-11-12 NOTE — ED Notes (Signed)
Pt being transported to rm 118 at this time by this tech and Vet,EDT.

## 2017-11-12 NOTE — Consult Note (Addendum)
[facility] TeleNeurology Consult Services  Impression:  Acute Stroke  Likely in the left MCA territory and embolic in origin. Given recent stroke as well, favor stroke w/u now as clinical syndrome in not in keeping with his h/o lacunar strokes and is more likely embolic.  Not a tpa candidate due to: outside treatment window, recent stroke Meets LVO screening criteria (aphasia), therefore advanced imaging is indicated to r/o LVO.    Comments:   Door Time: 617 [facility] contacted: [phone] [facility] at bedside: 640 NIHSS assessment time: 19 [place] known well LKW: 2300  Recommendations:   Continue ASA an dPlavix as prior Stroke protocol admission/ orderset suggested with placement on stroke floor  CTA now to r/o LVO - if this is positive, he may be candidate for intervention.  tele monitoring Bedside swallow evaluation HOB less than 30 degrees IV Fluid hydration with NS Euglycemia avoid hyperthermia, PRN acetaminophen dvt ppx Consider neurology consult Discussed with ED MD Please call with questions  -----------------------------------------------------------------------------------------  CC stroke alert  History of Present Illness   Patient is a 84 yo M with h/o HTN , recent stroke in June which caused transient left sided weakness, multiple prior TIAs HLD DM pw confusion, word finding difficulty noted this morning upon awakening by his wife.  LKW 2300 last night prior to bed Symptoms c/w aphasia and mild right sided weakness He is on asa and plavix daily His last stroke in June had symptoms lasting only 2-3 hours.    Diagnostic: HCT is negative for acute findings today MRI 10/18/17: IMPRESSION: 1. Acute small right parieto-occipital white matter infarct. 2. Old small right frontal lobe/MCA territory infarct. Multiple old small vessel infarcts. 3. Moderate chronic small vessel ischemic changes. 4. Moderate to severe chronic small vessel ischemic changes. 5.  These results will be called to the ordering clinician or representative by the Radiologist Assistant, and communication documented in the PACS or zVision Dashboard.  Exam: Patient is in no apparent distress. Patient appears as stated age. No obvious acute respiratory or cardiac distress. Patient is well groomed and well-nourished.   NIHSS score:8  1A: Level of Consciousness - Alert; keenly responsive 0 1B: Ask Month and Age - 0 Questions Right +2 1C: 'Blink Eyes' & 'Squeeze Hands' - Performs 1 Task +1 2: Test Horizontal Extraocular Movements - Normal 0 3: Test Visual Fields - No Visual Loss 0 4: Test Facial Palsy - Minor paralysis (flat nasolabial fold, smile asymetry) +1 5A: Test Left Arm Motor Drift - No Drift for 10 Seconds 0 5B: Test Right Arm Motor Drift - Drift, but doesn't hit bed +1 6A: Test Left Leg Motor Drift - No Drift for 5 Seconds 0 6B: Test Right Leg Motor Drift - No Drift for 5 Seconds 0 7: Test Limb Ataxia - No Ataxia 0 8: Test Sensation - Normal; No sensory loss 0 9: Test Language/Aphasia- Severe Aphasia: Fragmentary Expression, Inference Needed, Cannot Identify Materials +2 10: Test Dysarthria - Mild-Moderate Dysarthria: Slurring but can be understood +1 11: Test Extinction/Inattention - No abnormality 0    Medical Decision Making:  - Extensive number of diagnosis or management options are considered above.   - Extensive amount of complex data reviewed.   - High risk of complication and/or morbidity or mortality are associated with differential diagnostic considerations above.  - There may be Uncertain outcome and increased probability of prolonged functional impairment or high probability of severe prolonged functional impairment associated with some of these differential diagnosis.  Medical Data Reviewed:  1.Data reviewed include clinical labs, radiology,  Medical Tests;   2.Tests results discussed w/performing or interpreting physician;   3.Obtaining/reviewing  old medical records;  4.Obtaining case history from another source;  5.Independent review of image, tracing or specimen.    Patient was informed the Neurology Consult would happen via telehealth (remote video) and consented to receiving care in this manner.      ADD:  Pts CTA and P reviewed by me. Rads report pending RAPID scan is negative for penumbra. Perfusion appears symmetric No obvious proximal LVO is seen therefore nIR would not be indicated.  Medical management as above.

## 2017-11-12 NOTE — ED Provider Notes (Signed)
[facility] Emergency Department Provider Note   ____________________________________________   First MD Initiated Contact with Patient 11/12/17 [phone]     (approximate)  I have reviewed the triage vital signs and the nursing notes.   HISTORY  Chief Complaint Altered Mental Status  History obtained from patient's wife and EMS.  HPI Brian Barry is a 84 y.o. male who comes into the hospital today with altered mental status.  The patient's wife states that he woke up and she states that he did not seem right.  She had to help him to the bathroom and when she tried to get him to talk he could not speak and could not follow commands.  He has a history of strokes and TIAs.  The family tried to give him some orange juice and they state that he swallowed it without difficulty.  The patient's wife states that he had a bleed in his brain on the left side 2 weeks ago.  The patient has some significant difficulty with finding words when asked his birthdate.  He does intermittently follow commands.  I am unable to determine if he has any chest pain or any other complaints as he stares off and will answer some questions.  He is here for evaluation.   Past Medical History:  Diagnosis Date  . Arthritis   . BPH (benign prostatic hyperplasia)   . DM (diabetes mellitus) ([place])   . Hyperlipidemia   . Hypertension   . Hypothyroidism   . Osteoporosis   . Pelvic fracture ([place])   . TIA (transient ischemic attack) 1998, 2014    Patient Active Problem List   Diagnosis Date Noted  . TIA (transient ischemic attack) 07/16/2016  . Confusion   . Ascending aortic aneurysm ([place]) 05/19/2016  . Hyponatremia 05/19/2016  . Generalized weakness 05/19/2016  . Sepsis due to pneumonia ([place]) 05/18/2016  . Hip fracture ([place]) 09/10/2015    Past Surgical History:  Procedure Laterality Date  . bilateral hip replacement Bilateral 1998  . Left knee replacement Left 2007  . LUMBAR  LAMINECTOMY  2010  . right hip revision Right 2008    Prior to Admission medications   Medication Sig Start Date End Date Taking? Authorizing Provider  ACCU-CHEK SOFTCLIX LANCETS lancets Use as instructed twice daily 12/16/15  Yes [provider]  Alpha-Lipoic Acid 600 MG CAPS Take 600 mg by mouth daily. 10/09/17 12/09/17 Yes [provider]  aspirin EC 81 MG tablet Take 81 mg by mouth daily.   Yes [provider]  calcium-vitamin D (OSCAL WITH D) 500-200 MG-UNIT tablet Take 1 tablet by mouth daily.   Yes [provider]  clopidogrel (PLAVIX) 75 MG tablet Take 75 mg by mouth daily. 07/11/15  Yes [provider]  feeding supplement, GLUCERNA SHAKE, (GLUCERNA SHAKE) LIQD Take 237 mLs by mouth 2 (two) times daily between meals. 05/19/16  Yes Theodoro Grist, MD  finasteride (PROSCAR) 5 MG tablet Take 5 mg by mouth daily.   Yes [provider]  gabapentin (NEURONTIN) 300 MG capsule Take 300 mg by mouth at bedtime.  07/10/16 12/09/17 Yes [provider]  galantamine (RAZADYNE ER) 8 MG 24 hr capsule Take 8 mg by mouth daily with breakfast.   Yes [provider]  LANTUS SOLOSTAR 100 UNIT/ML Solostar Pen Inject 18 Units into the skin every morning.  06/13/15  Yes [provider]  levothyroxine (SYNTHROID, LEVOTHROID) 25 MCG tablet Take 1 tablet by mouth daily. 04/26/16  Yes  [provider]  lisinopril (PRINIVIL,ZESTRIL) 10 MG tablet Take 10 mg by mouth daily.  07/11/15  Yes [provider]  metFORMIN (GLUCOPHAGE) 500 MG tablet Take 1,000 mg by mouth 2 (two) times daily.  08/12/15  Yes [provider]  polyethylene glycol (MIRALAX / GLYCOLAX) packet Take 17 g by mouth daily. Patient taking differently: Take 17 g daily as needed by mouth.  09/12/15  Yes Wieting, Richard, MD  PROLIA 60 MG/ML SOSY injection Inject 60 mg into the skin every 6 (six) months. 09/13/17  Yes [provider]  senna-docusate  (SENOKOT-S) 8.6-50 MG tablet Take 1 tablet by mouth at bedtime as needed for mild constipation. 09/12/15  Yes Wieting, Richard, MD  simvastatin (ZOCOR) 20 MG tablet Take 20 mg by mouth every evening.  07/11/15  Yes [provider]  tamsulosin (FLOMAX) 0.4 MG CAPS capsule Take 0.4 mg by mouth 2 (two) times daily. 08/08/15  Yes [provider]  vitamin B-12 (CYANOCOBALAMIN) 1000 MCG tablet Take 1,000 mcg daily by mouth.   Yes [provider]  dextromethorphan (DELSYM) 30 MG/5ML liquid Take 5 mLs (30 mg total) by mouth 2 (two) times daily. Patient not taking: Reported on 07/12/2016 05/19/16   Theodoro Grist, MD  insulin aspart (NOVOLOG) 100 UNIT/ML FlexPen Inject 5 Units into the skin 3 (three) times daily with meals. Patient not taking: Reported on 07/12/2016 05/19/16   Theodoro Grist, MD  meclizine (ANTIVERT) 25 MG tablet Take 1 tablet (25 mg total) by mouth 3 (three) times daily as needed. Patient not taking: Reported on 07/12/2016 05/30/16   Merlyn Lot, MD    Allergies Patient has no known allergies.  Family History  Problem Relation Age of Onset  . CVA Father 77  . Prostate cancer Neg Hx   . Kidney cancer Neg Hx   . Bladder Cancer Neg Hx     Social History Social History   Tobacco Use  . Smoking status: Never Smoker  . Smokeless tobacco: Never Used  Substance Use Topics  . Alcohol use: No    Alcohol/week: 0.0 oz  . Drug use: No    Review of Systems  Constitutional: No fever/chills Eyes: No visual changes. ENT: No sore throat. Cardiovascular: Denies chest pain. Respiratory: Denies shortness of breath. Gastrointestinal: No abdominal pain.  No nausea, no vomiting.  No diarrhea.  No constipation. Genitourinary: Negative for dysuria. Musculoskeletal: Negative for back pain. Skin: Negative for rash. Neurological: aphasia   ____________________________________________   PHYSICAL EXAM:  VITAL SIGNS: ED Triage Vitals  Enc Vitals Group     BP  11/12/17 0700 (!) 141/68     Pulse Rate 11/12/17 0700 61     Resp 11/12/17 0700 16     Temp 11/12/17 0627 98 F (36.7 C)     Temp Source 11/12/17 0627 Oral     SpO2 11/12/17 0700 96 %     Weight --      Height --      Head Circumference --      Peak Flow --      Pain Score 11/12/17 0627 0     Pain Loc --      Pain Edu? --      Excl. in [place]? --     Constitutional: Alert oriented to person, well appearing and in moderate distress. Eyes: Conjunctivae are normal. PERRL. EOMI. Head: Atraumatic. Nose: No congestion/rhinnorhea. Mouth/Throat: Mucous membranes are moist.  Oropharynx non-erythematous. Cardiovascular: Normal rate, regular rhythm. Grossly normal heart sounds.  Good peripheral circulation. Respiratory: Normal respiratory effort.  No retractions. Lungs CTAB. Gastrointestinal: Soft and nontender. No distention.  Positive bowel sounds Musculoskeletal: No lower extremity tenderness nor edema.   Neurologic: Patient with some mild right-sided facial droop, a aphasia and word finding difficulties, mild right-sided pronator drift mild right-sided upper extremity weakness sensation intact throughout. Skin:  Skin is warm, dry and intact.  Psychiatric: Mood and affect are normal.   ____________________________________________   LABS (all labs ordered are listed, but only abnormal results are displayed)  Labs Reviewed  CBC - Abnormal; Notable for the following components:      Result Value   RBC 3.62 (*)    Hemoglobin 11.9 (*)    HCT 34.4 (*)    All other components within normal limits  DIFFERENTIAL - Abnormal; Notable for the following components:   Lymphs Abs 0.7 (*)    All other components within normal limits  COMPREHENSIVE METABOLIC PANEL - Abnormal; Notable for the following components:   Sodium 130 (*)    Chloride 93 (*)    Glucose, Bld 174 (*)    All other components within normal limits  ETHANOL  PROTIME-INR  APTT  TROPONIN I  URINE DRUG SCREEN, QUALITATIVE  ([facility] ONLY)  URINALYSIS, ROUTINE W REFLEX MICROSCOPIC   ____________________________________________  EKG  ED ECG REPORT I, Loney Hering, the attending physician, personally viewed and interpreted this ECG.   Date: 11/12/2017  EKG Time: 612  Rate: 63  Rhythm: normal sinus rhythm  Axis: normal  Intervals:none  ST&T Change: none  ____________________________________________  RADIOLOGY  ED MD interpretation:    CT head: No hemorrhage or mass effect, multiple old infarcts, chronic small vessel disease and generalized atrophy.  CT angio head and neck, perfusion: Negative perfusion examination, atherosclerotic change at the carotid bifurcations but no significant stenosis  Official radiology report(s): Ct Angio Head W Or Wo Contrast  Result Date: 11/12/2017 CLINICAL DATA:  Mental status changes beginning a acutely. No acute findings by head CT. Extensive old ischemic changes. EXAM: CT ANGIOGRAPHY HEAD AND NECK CT PERFUSION BRAIN TECHNIQUE: Multidetector CT imaging of the head and neck was performed using the standard protocol during bolus administration of intravenous contrast. Multiplanar CT image reconstructions and MIPs were obtained to evaluate the vascular anatomy. Carotid stenosis measurements (when applicable) are obtained utilizing NASCET criteria, using the distal internal carotid diameter as the denominator. Multiphase CT imaging of the brain was performed following IV bolus contrast injection. Subsequent parametric perfusion maps were calculated using RAPID software. CONTRAST:  11mL ISOVUE-370 IOPAMIDOL (ISOVUE-370) INJECTION 76% COMPARISON:  CT earlier same day.  MRI 10/18/2017. FINDINGS: CTA NECK FINDINGS Aortic arch: Aortic atherosclerosis. No aneurysm or dissection. Branching pattern of the brachiocephalic vessels from the arch is normal without origin stenosis. Right carotid system: Common carotid artery widely patent to the bifurcation region. Atherosclerotic plaque  at the carotid bifurcation and ICA bulb but no stenosis. Cervical ICA widely patent. Left carotid system: Common carotid artery widely patent to the bifurcation region. Atherosclerotic plaque at the carotid bifurcation and ICA bulb. Minimal diameter of the distal bulb is 4 mm. Compared to a more distal cervical ICA diameter of 5 mm, this indicates a 20% stenosis. Vertebral arteries: Right vertebral artery origin widely patent. Right vertebral artery is widely patent through the cervical region to the foramen magnum. Calcified plaque at the left vertebral artery origin. Stenosis estimated at 30-50%. Beyond that, the vessel is tortuous but widely patent to the foramen magnum. Skeleton: Degenerative  cervical spondylosis and facet arthropathy. Other neck: No mass or lymphadenopathy. Upper chest: Negative Review of the MIP images confirms the above findings CTA HEAD FINDINGS Anterior circulation: Both internal carotid arteries are patent through the skull base and siphon regions. There is atherosclerotic calcification in both carotid siphon regions with narrowing estimated at 30%. Anterior and middle cerebral vessels are patent without proximal stenosis, aneurysm or vascular malformation. No missing vessels identified. Posterior circulation: Both vertebral arteries widely patent to the basilar. No basilar stenosis. Posterior circulation branch vessels appear normal. Venous sinuses: Patent and normal. Anatomic variants: None significant. Delayed phase: No abnormal enhancement. Review of the MIP images confirms the above findings CT Brain Perfusion Findings: CBF (<30%) Volume: 41mL Perfusion (Tmax>6.0s) volume: 45mL Mismatch Volume: 77mL Infarction Location:None Infarct Core: 0 mL Infarction Location:None IMPRESSION: Negative perfusion examination. CT angiography shows atherosclerotic change at the carotid bifurcations but no significant stenosis. 20% stenosis of the distal left ICA bulb. 30% narrowing in both carotid siphon  regions. 30-50% stenosis of the left vertebral artery origin, but wide patency beyond that. These results were called by telephone at the time of interpretation on 11/12/2017 at 8:00 am to Dr. Dahlia Client , who verbally acknowledged these results. Electronically Signed   By: Nelson Chimes M.D.   On: 11/12/2017 08:03   Ct Angio Neck W Or Wo Contrast  Result Date: 11/12/2017 CLINICAL DATA:  Mental status changes beginning a acutely. No acute findings by head CT. Extensive old ischemic changes. EXAM: CT ANGIOGRAPHY HEAD AND NECK CT PERFUSION BRAIN TECHNIQUE: Multidetector CT imaging of the head and neck was performed using the standard protocol during bolus administration of intravenous contrast. Multiplanar CT image reconstructions and MIPs were obtained to evaluate the vascular anatomy. Carotid stenosis measurements (when applicable) are obtained utilizing NASCET criteria, using the distal internal carotid diameter as the denominator. Multiphase CT imaging of the brain was performed following IV bolus contrast injection. Subsequent parametric perfusion maps were calculated using RAPID software. CONTRAST:  159mL ISOVUE-370 IOPAMIDOL (ISOVUE-370) INJECTION 76% COMPARISON:  CT earlier same day.  MRI 10/18/2017. FINDINGS: CTA NECK FINDINGS Aortic arch: Aortic atherosclerosis. No aneurysm or dissection. Branching pattern of the brachiocephalic vessels from the arch is normal without origin stenosis. Right carotid system: Common carotid artery widely patent to the bifurcation region. Atherosclerotic plaque at the carotid bifurcation and ICA bulb but no stenosis. Cervical ICA widely patent. Left carotid system: Common carotid artery widely patent to the bifurcation region. Atherosclerotic plaque at the carotid bifurcation and ICA bulb. Minimal diameter of the distal bulb is 4 mm. Compared to a more distal cervical ICA diameter of 5 mm, this indicates a 20% stenosis. Vertebral arteries: Right vertebral artery origin widely  patent. Right vertebral artery is widely patent through the cervical region to the foramen magnum. Calcified plaque at the left vertebral artery origin. Stenosis estimated at 30-50%. Beyond that, the vessel is tortuous but widely patent to the foramen magnum. Skeleton: Degenerative cervical spondylosis and facet arthropathy. Other neck: No mass or lymphadenopathy. Upper chest: Negative Review of the MIP images confirms the above findings CTA HEAD FINDINGS Anterior circulation: Both internal carotid arteries are patent through the skull base and siphon regions. There is atherosclerotic calcification in both carotid siphon regions with narrowing estimated at 30%. Anterior and middle cerebral vessels are patent without proximal stenosis, aneurysm or vascular malformation. No missing vessels identified. Posterior circulation: Both vertebral arteries widely patent to the basilar. No basilar stenosis. Posterior circulation branch vessels appear normal. Venous sinuses:  Patent and normal. Anatomic variants: None significant. Delayed phase: No abnormal enhancement. Review of the MIP images confirms the above findings CT Brain Perfusion Findings: CBF (<30%) Volume: 48mL Perfusion (Tmax>6.0s) volume: 43mL Mismatch Volume: 92mL Infarction Location:None Infarct Core: 0 mL Infarction Location:None IMPRESSION: Negative perfusion examination. CT angiography shows atherosclerotic change at the carotid bifurcations but no significant stenosis. 20% stenosis of the distal left ICA bulb. 30% narrowing in both carotid siphon regions. 30-50% stenosis of the left vertebral artery origin, but wide patency beyond that. These results were called by telephone at the time of interpretation on 11/12/2017 at 8:00 am to Dr. Dahlia Client , who verbally acknowledged these results. Electronically Signed   By: Nelson Chimes M.D.   On: 11/12/2017 08:03   Ct Cerebral Perfusion W Contrast  Result Date: 11/12/2017 CLINICAL DATA:  Mental status changes beginning a  acutely. No acute findings by head CT. Extensive old ischemic changes. EXAM: CT ANGIOGRAPHY HEAD AND NECK CT PERFUSION BRAIN TECHNIQUE: Multidetector CT imaging of the head and neck was performed using the standard protocol during bolus administration of intravenous contrast. Multiplanar CT image reconstructions and MIPs were obtained to evaluate the vascular anatomy. Carotid stenosis measurements (when applicable) are obtained utilizing NASCET criteria, using the distal internal carotid diameter as the denominator. Multiphase CT imaging of the brain was performed following IV bolus contrast injection. Subsequent parametric perfusion maps were calculated using RAPID software. CONTRAST:  174mL ISOVUE-370 IOPAMIDOL (ISOVUE-370) INJECTION 76% COMPARISON:  CT earlier same day.  MRI 10/18/2017. FINDINGS: CTA NECK FINDINGS Aortic arch: Aortic atherosclerosis. No aneurysm or dissection. Branching pattern of the brachiocephalic vessels from the arch is normal without origin stenosis. Right carotid system: Common carotid artery widely patent to the bifurcation region. Atherosclerotic plaque at the carotid bifurcation and ICA bulb but no stenosis. Cervical ICA widely patent. Left carotid system: Common carotid artery widely patent to the bifurcation region. Atherosclerotic plaque at the carotid bifurcation and ICA bulb. Minimal diameter of the distal bulb is 4 mm. Compared to a more distal cervical ICA diameter of 5 mm, this indicates a 20% stenosis. Vertebral arteries: Right vertebral artery origin widely patent. Right vertebral artery is widely patent through the cervical region to the foramen magnum. Calcified plaque at the left vertebral artery origin. Stenosis estimated at 30-50%. Beyond that, the vessel is tortuous but widely patent to the foramen magnum. Skeleton: Degenerative cervical spondylosis and facet arthropathy. Other neck: No mass or lymphadenopathy. Upper chest: Negative Review of the MIP images confirms the  above findings CTA HEAD FINDINGS Anterior circulation: Both internal carotid arteries are patent through the skull base and siphon regions. There is atherosclerotic calcification in both carotid siphon regions with narrowing estimated at 30%. Anterior and middle cerebral vessels are patent without proximal stenosis, aneurysm or vascular malformation. No missing vessels identified. Posterior circulation: Both vertebral arteries widely patent to the basilar. No basilar stenosis. Posterior circulation branch vessels appear normal. Venous sinuses: Patent and normal. Anatomic variants: None significant. Delayed phase: No abnormal enhancement. Review of the MIP images confirms the above findings CT Brain Perfusion Findings: CBF (<30%) Volume: 74mL Perfusion (Tmax>6.0s) volume: 14mL Mismatch Volume: 83mL Infarction Location:None Infarct Core: 0 mL Infarction Location:None IMPRESSION: Negative perfusion examination. CT angiography shows atherosclerotic change at the carotid bifurcations but no significant stenosis. 20% stenosis of the distal left ICA bulb. 30% narrowing in both carotid siphon regions. 30-50% stenosis of the left vertebral artery origin, but wide patency beyond that. These results were called by telephone at  the time of interpretation on 11/12/2017 at 8:00 am to Dr. Dahlia Client , who verbally acknowledged these results. Electronically Signed   By: Nelson Chimes M.D.   On: 11/12/2017 08:03   Ct Head Code Stroke Wo Contrast  Result Date: 11/12/2017 CLINICAL DATA:  Code stroke.  Speech disturbance. EXAM: CT HEAD WITHOUT CONTRAST TECHNIQUE: Contiguous axial images were obtained from the base of the skull through the vertex without intravenous contrast. COMPARISON:  Head CT 03/29/2017 FINDINGS: Brain: There is no mass, hemorrhage or extra-axial collection. There is generalized atrophy without lobar predilection. Old infarcts of the right frontal lobe, bilateral centrum semiovale and bilateral basal ganglia. There is  hypoattenuation of the periventricular white matter, most commonly indicating chronic ischemic microangiopathy. Vascular: Atherosclerotic calcification of the internal carotid arteries at the skull base. No abnormal hyperdensity of the major intracranial arteries or dural venous sinuses. Skull: The visualized skull base, calvarium and extracranial soft tissues are normal. Sinuses/Orbits: No fluid levels or advanced mucosal thickening of the visualized paranasal sinuses. No mastoid or middle ear effusion. The orbits are normal. ASPECTS [facility] Stroke Program Early CT Score) - Ganglionic level infarction (caudate, lentiform nuclei, internal capsule, insula, M1-M3 cortex): 7 - Supraganglionic infarction (M4-M6 cortex): 3 Total score (0-10 with 10 being normal): 10 IMPRESSION: 1. No hemorrhage or mass effect. 2. Multiple old infarcts, chronic small vessel disease and generalized atrophy. 3. ASPECTS is 10. 4. These results were called by telephone at the time of interpretation on 11/12/2017 at 6:38 am to Dr. Charlesetta Ivory , who verbally acknowledged these results. Electronically Signed   By: Ulyses Jarred M.D.   On: 11/12/2017 06:39    ____________________________________________   PROCEDURES  Procedure(s) performed: please, see procedure note(s).  .Critical Care Performed by: Loney Hering, MD Authorized by: Loney Hering, MD   Critical care provider statement:    Critical care time (minutes):  30   Critical care start time:  11/12/2017 6:07 AM   Critical care end time:  11/12/2017 6:37 AM   Critical care time was exclusive of:  Separately billable procedures and treating other patients   Critical care was necessary to treat or prevent imminent or life-threatening deterioration of the following conditions:  CNS failure or compromise   Critical care was time spent personally by me on the following activities:  Development of treatment plan with patient or surrogate, discussions with  consultants, evaluation of patient's response to treatment, examination of patient, obtaining history from patient or surrogate, ordering and performing treatments and interventions, ordering and review of laboratory studies, ordering and review of radiographic studies, pulse oximetry, re-evaluation of patient's condition and review of old charts   I assumed direction of critical care for this patient from another provider in my specialty: no      Critical Care performed: Yes, see critical care note(s)  ____________________________________________   INITIAL IMPRESSION / ASSESSMENT AND PLAN / ED COURSE  As part of my medical decision making, I reviewed the following data within the electronic MEDICAL RECORD NUMBER Notes from prior ED visits and [place] Controlled Substance Database   This is an 84 year old male who comes into the hospital today with some altered mental status.  The patient is having some difficulty answering questions and speaking clearly.  The patient has a history of a stroke approximately 3 weeks ago without any hemorrhagic insult.  After examining the patient I do feel that he has an NIH of stroke scale of 5, but the patient is not eligible  for TPA as his symptom onset was outside the window of 4 hours and he has a recent stroke approximately 3 weeks ago.  We did  call a code stroke though and had the patient evaluated by tele-neurology.  He did receive some CT studies include a regular CT as well as a CTA head and neck and a CT perfusion.  The patient's CT scans were unremarkable.  He also failed his stroke swallow study.   Since his CT scans were unremarkable the neurologist did recommend admitting the patient for hospitalist evaluation and medical management.  The patient will be admitted to the hospitalist service.  He has no further questions or concerns.      ____________________________________________   FINAL CLINICAL IMPRESSION(S) / ED DIAGNOSES  Final diagnoses:    Cerebrovascular accident (CVA), unspecified mechanism ([place])  Aphasia     ED Discharge Orders    None       Note:  This document was prepared using Dragon voice recognition software and may include unintentional dictation errors.    Loney Hering, MD 11/12/17 [phone]

## 2017-11-12 NOTE — Progress Notes (Signed)
   11/12/17 0640  Clinical Encounter Type  Visited With Patient not available  Visit Type Initial   Code Stroke.  Arrived on unit, checked in with staff. Patient, family member, and nurse were engaged in videoconference with neurologist.

## 2017-11-12 NOTE — Progress Notes (Signed)
*  PRELIMINARY RESULTS* Echocardiogram 2D Echocardiogram has been performed.  Brian Barry 11/12/2017, 1:54 PM

## 2017-11-12 NOTE — H&P (Signed)
[facility] NAME: Brian Barry    MR#:  [id number]  DATE OF BIRTH:  [birth year]  DATE OF ADMISSION:  11/12/2017  PRIMARY CARE PHYSICIAN: Derinda Late, MD   REQUESTING/REFERRING PHYSICIAN: Charlesetta Ivory MD  CHIEF COMPLAINT:   Chief Complaint  Patient presents with  . Altered Mental Status    HISTORY OF PRESENT ILLNESS: Brian Barry  is a 84 y.o. male with a known history of osteoarthritis, BPH, diabetes, hyperlipidemia, essential hypertension, hypothyroidism, previous TIA who is presenting to the hospital with altered mental status.  Patient was in his usual state of health last night however when he woke up this morning he was confused according to his wife.  And not able to answer any questions.  Even when he arrived in the ED patient was really not responding to any questions.  Now he is doing better.  According to his wife he uses a walker to walk.  He has a chronic left-sided foot drop.  Patient does have some dementia.  But is able to carry on his daily activities.  Currently denies any other weakness or difficulty with swallowing.  Patient's wife states that his blood sugar was 79 this morning which is low for him. PAST MEDICAL HISTORY:   Past Medical History:  Diagnosis Date  . Arthritis   . BPH (benign prostatic hyperplasia)   . DM (diabetes mellitus) ([place])   . Hyperlipidemia   . Hypertension   . Hypothyroidism   . Osteoporosis   . Pelvic fracture ([place])   . TIA (transient ischemic attack) 1998, 2014    PAST SURGICAL HISTORY:  Past Surgical History:  Procedure Laterality Date  . bilateral hip replacement Bilateral 1998  . Left knee replacement Left 2007  . LUMBAR LAMINECTOMY  2010  . right hip revision Right 2008    SOCIAL HISTORY:  Social History   Tobacco Use  . Smoking status: Never Smoker  . Smokeless tobacco: Never Used  Substance Use Topics  . Alcohol use: No    Alcohol/week: 0.0 oz    FAMILY  HISTORY:  Family History  Problem Relation Age of Onset  . CVA Father 19  . Prostate cancer Neg Hx   . Kidney cancer Neg Hx   . Bladder Cancer Neg Hx     DRUG ALLERGIES: No Known Allergies  REVIEW OF SYSTEMS:   CONSTITUTIONAL:  Limited due to patient's dementia  MEDICATIONS AT HOME:  Prior to Admission medications   Medication Sig Start Date End Date Taking? Authorizing Provider  ACCU-CHEK SOFTCLIX LANCETS lancets Use as instructed twice daily 12/16/15  Yes [provider]  Alpha-Lipoic Acid 600 MG CAPS Take 600 mg by mouth daily. 10/09/17 12/09/17 Yes [provider]  aspirin EC 81 MG tablet Take 81 mg by mouth daily.   Yes [provider]  calcium-vitamin D (OSCAL WITH D) 500-200 MG-UNIT tablet Take 1 tablet by mouth daily.   Yes [provider]  clopidogrel (PLAVIX) 75 MG tablet Take 75 mg by mouth daily. 07/11/15  Yes [provider]  feeding supplement, GLUCERNA SHAKE, (GLUCERNA SHAKE) LIQD Take 237 mLs by mouth 2 (two) times daily between meals. 05/19/16  Yes Theodoro Grist, MD  finasteride (PROSCAR) 5 MG tablet Take 5 mg by mouth daily.   Yes [provider]  gabapentin (NEURONTIN) 300 MG capsule Take 300 mg by mouth at bedtime.  07/10/16 12/09/17 Yes [provider]  galantamine (RAZADYNE ER) 8 MG 24  hr capsule Take 8 mg by mouth daily with breakfast.   Yes [provider]  LANTUS SOLOSTAR 100 UNIT/ML Solostar Pen Inject 18 Units into the skin every morning.  06/13/15  Yes [provider]  levothyroxine (SYNTHROID, LEVOTHROID) 25 MCG tablet Take 1 tablet by mouth daily. 04/26/16  Yes [provider]  lisinopril (PRINIVIL,ZESTRIL) 10 MG tablet Take 10 mg by mouth daily.  07/11/15  Yes [provider]  metFORMIN (GLUCOPHAGE) 500 MG tablet Take 1,000 mg by mouth 2 (two) times daily.  08/12/15  Yes [provider]  polyethylene glycol (MIRALAX / GLYCOLAX) packet Take 17 g by mouth  daily. Patient taking differently: Take 17 g daily as needed by mouth.  09/12/15  Yes Wieting, Richard, MD  PROLIA 60 MG/ML SOSY injection Inject 60 mg into the skin every 6 (six) months. 09/13/17  Yes [provider]  senna-docusate (SENOKOT-S) 8.6-50 MG tablet Take 1 tablet by mouth at bedtime as needed for mild constipation. 09/12/15  Yes Wieting, Richard, MD  simvastatin (ZOCOR) 20 MG tablet Take 20 mg by mouth every evening.  07/11/15  Yes [provider]  tamsulosin (FLOMAX) 0.4 MG CAPS capsule Take 0.4 mg by mouth 2 (two) times daily. 08/08/15  Yes [provider]  vitamin B-12 (CYANOCOBALAMIN) 1000 MCG tablet Take 1,000 mcg daily by mouth.   Yes [provider]  dextromethorphan (DELSYM) 30 MG/5ML liquid Take 5 mLs (30 mg total) by mouth 2 (two) times daily. Patient not taking: Reported on 07/12/2016 05/19/16   Theodoro Grist, MD  insulin aspart (NOVOLOG) 100 UNIT/ML FlexPen Inject 5 Units into the skin 3 (three) times daily with meals. Patient not taking: Reported on 07/12/2016 05/19/16   Theodoro Grist, MD  meclizine (ANTIVERT) 25 MG tablet Take 1 tablet (25 mg total) by mouth 3 (three) times daily as needed. Patient not taking: Reported on 07/12/2016 05/30/16   Merlyn Lot, MD      PHYSICAL EXAMINATION:   VITAL SIGNS: Blood pressure (!) 141/68, pulse 61, temperature 98 F (36.7 C), resp. rate 16, SpO2 96 %.  GENERAL:  84 y.o.-year-old patient lying in the bed with no acute distress.  EYES: Pupils equal, round, reactive to light and accommodation. No scleral icterus. Extraocular muscles intact.  HEENT: Head atraumatic, normocephalic. Oropharynx and nasopharynx clear.  NECK:  Supple, no jugular venous distention. No thyroid enlargement, no tenderness.  LUNGS: Normal breath sounds bilaterally, no wheezing, rales,rhonchi or crepitation. No use of accessory muscles of respiration.  CARDIOVASCULAR: S1, S2 normal. No murmurs, rubs, or gallops.  ABDOMEN: Soft,  nontender, nondistended. Bowel sounds present. No organomegaly or mass.  EXTREMITIES: No pedal edema, cyanosis, or clubbing.  NEUROLOGIC: Cranial nerves II through XII are intact. Muscle strength 5/5 in all extremities. Sensation intact. Gait not checked.  PSYCHIATRIC: The patient is alert and oriented person not time or place SKIN: No obvious rash, lesion, or ulcer.   LABORATORY PANEL:   CBC Recent Labs  Lab 11/12/17 0611  WBC 5.5  HGB 11.9*  HCT 34.4*  PLT 183  MCV 95.0  MCH 33.0  MCHC 34.7  RDW 13.3  LYMPHSABS 0.7*  MONOABS 0.6  EOSABS 0.1  BASOSABS 0.0   ------------------------------------------------------------------------------------------------------------------  Chemistries  Recent Labs  Lab 11/12/17 0611  NA 130*  K 4.7  CL 93*  CO2 28  GLUCOSE 174*  BUN 15  CREATININE 0.72  CALCIUM 8.9  AST 32  ALT 15  ALKPHOS 44  BILITOT 1.0   ------------------------------------------------------------------------------------------------------------------ CrCl  cannot be calculated (Unknown ideal weight.). ------------------------------------------------------------------------------------------------------------------ No results for input(s): TSH, T4TOTAL, T3FREE, THYROIDAB in the last 72 hours.  Invalid input(s): FREET3   Coagulation profile Recent Labs  Lab 11/12/17 0611  INR 0.93   ------------------------------------------------------------------------------------------------------------------- No results for input(s): DDIMER in the last 72 hours. -------------------------------------------------------------------------------------------------------------------  Cardiac Enzymes Recent Labs  Lab 11/12/17 0611  TROPONINI <0.03   ------------------------------------------------------------------------------------------------------------------ Invalid input(s):  POCBNP  ---------------------------------------------------------------------------------------------------------------  Urinalysis    Component Value Date/Time   COLORURINE YELLOW (A) 03/29/2017 1009   APPEARANCEUR CLEAR (A) 03/29/2017 1009   APPEARANCEUR Clear 07/12/2016 1458   LABSPEC 1.013 03/29/2017 1009   PHURINE 7.0 03/29/2017 1009   GLUCOSEU NEGATIVE 03/29/2017 1009   HGBUR NEGATIVE 03/29/2017 1009   BILIRUBINUR NEGATIVE 03/29/2017 1009   BILIRUBINUR Negative 07/12/2016 1458   KETONESUR NEGATIVE 03/29/2017 1009   PROTEINUR NEGATIVE 03/29/2017 1009   NITRITE NEGATIVE 03/29/2017 1009   LEUKOCYTESUR NEGATIVE 03/29/2017 1009   LEUKOCYTESUR Negative 07/12/2016 1458     RADIOLOGY: Ct Angio Head W Or Wo Contrast  Result Date: 11/12/2017 CLINICAL DATA:  Mental status changes beginning a acutely. No acute findings by head CT. Extensive old ischemic changes. EXAM: CT ANGIOGRAPHY HEAD AND NECK CT PERFUSION BRAIN TECHNIQUE: Multidetector CT imaging of the head and neck was performed using the standard protocol during bolus administration of intravenous contrast. Multiplanar CT image reconstructions and MIPs were obtained to evaluate the vascular anatomy. Carotid stenosis measurements (when applicable) are obtained utilizing NASCET criteria, using the distal internal carotid diameter as the denominator. Multiphase CT imaging of the brain was performed following IV bolus contrast injection. Subsequent parametric perfusion maps were calculated using RAPID software. CONTRAST:  110mL ISOVUE-370 IOPAMIDOL (ISOVUE-370) INJECTION 76% COMPARISON:  CT earlier same day.  MRI 10/18/2017. FINDINGS: CTA NECK FINDINGS Aortic arch: Aortic atherosclerosis. No aneurysm or dissection. Branching pattern of the brachiocephalic vessels from the arch is normal without origin stenosis. Right carotid system: Common carotid artery widely patent to the bifurcation region. Atherosclerotic plaque at the carotid bifurcation  and ICA bulb but no stenosis. Cervical ICA widely patent. Left carotid system: Common carotid artery widely patent to the bifurcation region. Atherosclerotic plaque at the carotid bifurcation and ICA bulb. Minimal diameter of the distal bulb is 4 mm. Compared to a more distal cervical ICA diameter of 5 mm, this indicates a 20% stenosis. Vertebral arteries: Right vertebral artery origin widely patent. Right vertebral artery is widely patent through the cervical region to the foramen magnum. Calcified plaque at the left vertebral artery origin. Stenosis estimated at 30-50%. Beyond that, the vessel is tortuous but widely patent to the foramen magnum. Skeleton: Degenerative cervical spondylosis and facet arthropathy. Other neck: No mass or lymphadenopathy. Upper chest: Negative Review of the MIP images confirms the above findings CTA HEAD FINDINGS Anterior circulation: Both internal carotid arteries are patent through the skull base and siphon regions. There is atherosclerotic calcification in both carotid siphon regions with narrowing estimated at 30%. Anterior and middle cerebral vessels are patent without proximal stenosis, aneurysm or vascular malformation. No missing vessels identified. Posterior circulation: Both vertebral arteries widely patent to the basilar. No basilar stenosis. Posterior circulation branch vessels appear normal. Venous sinuses: Patent and normal. Anatomic variants: None significant. Delayed phase: No abnormal enhancement. Review of the MIP images confirms the above findings CT Brain Perfusion Findings: CBF (<30%) Volume: 78mL Perfusion (Tmax>6.0s) volume: 42mL Mismatch Volume: 40mL Infarction Location:None Infarct Core: 0 mL Infarction Location:None IMPRESSION: Negative perfusion examination. CT angiography shows atherosclerotic change at  the carotid bifurcations but no significant stenosis. 20% stenosis of the distal left ICA bulb. 30% narrowing in both carotid siphon regions. 30-50% stenosis of  the left vertebral artery origin, but wide patency beyond that. These results were called by telephone at the time of interpretation on 11/12/2017 at 8:00 am to Dr. Dahlia Client , who verbally acknowledged these results. Electronically Signed   By: Nelson Chimes M.D.   On: 11/12/2017 08:03   Ct Angio Neck W Or Wo Contrast  Result Date: 11/12/2017 CLINICAL DATA:  Mental status changes beginning a acutely. No acute findings by head CT. Extensive old ischemic changes. EXAM: CT ANGIOGRAPHY HEAD AND NECK CT PERFUSION BRAIN TECHNIQUE: Multidetector CT imaging of the head and neck was performed using the standard protocol during bolus administration of intravenous contrast. Multiplanar CT image reconstructions and MIPs were obtained to evaluate the vascular anatomy. Carotid stenosis measurements (when applicable) are obtained utilizing NASCET criteria, using the distal internal carotid diameter as the denominator. Multiphase CT imaging of the brain was performed following IV bolus contrast injection. Subsequent parametric perfusion maps were calculated using RAPID software. CONTRAST:  15mL ISOVUE-370 IOPAMIDOL (ISOVUE-370) INJECTION 76% COMPARISON:  CT earlier same day.  MRI 10/18/2017. FINDINGS: CTA NECK FINDINGS Aortic arch: Aortic atherosclerosis. No aneurysm or dissection. Branching pattern of the brachiocephalic vessels from the arch is normal without origin stenosis. Right carotid system: Common carotid artery widely patent to the bifurcation region. Atherosclerotic plaque at the carotid bifurcation and ICA bulb but no stenosis. Cervical ICA widely patent. Left carotid system: Common carotid artery widely patent to the bifurcation region. Atherosclerotic plaque at the carotid bifurcation and ICA bulb. Minimal diameter of the distal bulb is 4 mm. Compared to a more distal cervical ICA diameter of 5 mm, this indicates a 20% stenosis. Vertebral arteries: Right vertebral artery origin widely patent. Right vertebral artery is  widely patent through the cervical region to the foramen magnum. Calcified plaque at the left vertebral artery origin. Stenosis estimated at 30-50%. Beyond that, the vessel is tortuous but widely patent to the foramen magnum. Skeleton: Degenerative cervical spondylosis and facet arthropathy. Other neck: No mass or lymphadenopathy. Upper chest: Negative Review of the MIP images confirms the above findings CTA HEAD FINDINGS Anterior circulation: Both internal carotid arteries are patent through the skull base and siphon regions. There is atherosclerotic calcification in both carotid siphon regions with narrowing estimated at 30%. Anterior and middle cerebral vessels are patent without proximal stenosis, aneurysm or vascular malformation. No missing vessels identified. Posterior circulation: Both vertebral arteries widely patent to the basilar. No basilar stenosis. Posterior circulation branch vessels appear normal. Venous sinuses: Patent and normal. Anatomic variants: None significant. Delayed phase: No abnormal enhancement. Review of the MIP images confirms the above findings CT Brain Perfusion Findings: CBF (<30%) Volume: 9mL Perfusion (Tmax>6.0s) volume: 8mL Mismatch Volume: 44mL Infarction Location:None Infarct Core: 0 mL Infarction Location:None IMPRESSION: Negative perfusion examination. CT angiography shows atherosclerotic change at the carotid bifurcations but no significant stenosis. 20% stenosis of the distal left ICA bulb. 30% narrowing in both carotid siphon regions. 30-50% stenosis of the left vertebral artery origin, but wide patency beyond that. These results were called by telephone at the time of interpretation on 11/12/2017 at 8:00 am to Dr. Dahlia Client , who verbally acknowledged these results. Electronically Signed   By: Nelson Chimes M.D.   On: 11/12/2017 08:03   Ct Cerebral Perfusion W Contrast  Result Date: 11/12/2017 CLINICAL DATA:  Mental status changes beginning a acutely.  No acute findings by  head CT. Extensive old ischemic changes. EXAM: CT ANGIOGRAPHY HEAD AND NECK CT PERFUSION BRAIN TECHNIQUE: Multidetector CT imaging of the head and neck was performed using the standard protocol during bolus administration of intravenous contrast. Multiplanar CT image reconstructions and MIPs were obtained to evaluate the vascular anatomy. Carotid stenosis measurements (when applicable) are obtained utilizing NASCET criteria, using the distal internal carotid diameter as the denominator. Multiphase CT imaging of the brain was performed following IV bolus contrast injection. Subsequent parametric perfusion maps were calculated using RAPID software. CONTRAST:  162mL ISOVUE-370 IOPAMIDOL (ISOVUE-370) INJECTION 76% COMPARISON:  CT earlier same day.  MRI 10/18/2017. FINDINGS: CTA NECK FINDINGS Aortic arch: Aortic atherosclerosis. No aneurysm or dissection. Branching pattern of the brachiocephalic vessels from the arch is normal without origin stenosis. Right carotid system: Common carotid artery widely patent to the bifurcation region. Atherosclerotic plaque at the carotid bifurcation and ICA bulb but no stenosis. Cervical ICA widely patent. Left carotid system: Common carotid artery widely patent to the bifurcation region. Atherosclerotic plaque at the carotid bifurcation and ICA bulb. Minimal diameter of the distal bulb is 4 mm. Compared to a more distal cervical ICA diameter of 5 mm, this indicates a 20% stenosis. Vertebral arteries: Right vertebral artery origin widely patent. Right vertebral artery is widely patent through the cervical region to the foramen magnum. Calcified plaque at the left vertebral artery origin. Stenosis estimated at 30-50%. Beyond that, the vessel is tortuous but widely patent to the foramen magnum. Skeleton: Degenerative cervical spondylosis and facet arthropathy. Other neck: No mass or lymphadenopathy. Upper chest: Negative Review of the MIP images confirms the above findings CTA HEAD  FINDINGS Anterior circulation: Both internal carotid arteries are patent through the skull base and siphon regions. There is atherosclerotic calcification in both carotid siphon regions with narrowing estimated at 30%. Anterior and middle cerebral vessels are patent without proximal stenosis, aneurysm or vascular malformation. No missing vessels identified. Posterior circulation: Both vertebral arteries widely patent to the basilar. No basilar stenosis. Posterior circulation branch vessels appear normal. Venous sinuses: Patent and normal. Anatomic variants: None significant. Delayed phase: No abnormal enhancement. Review of the MIP images confirms the above findings CT Brain Perfusion Findings: CBF (<30%) Volume: 14mL Perfusion (Tmax>6.0s) volume: 85mL Mismatch Volume: 53mL Infarction Location:None Infarct Core: 0 mL Infarction Location:None IMPRESSION: Negative perfusion examination. CT angiography shows atherosclerotic change at the carotid bifurcations but no significant stenosis. 20% stenosis of the distal left ICA bulb. 30% narrowing in both carotid siphon regions. 30-50% stenosis of the left vertebral artery origin, but wide patency beyond that. These results were called by telephone at the time of interpretation on 11/12/2017 at 8:00 am to Dr. Dahlia Client , who verbally acknowledged these results. Electronically Signed   By: Nelson Chimes M.D.   On: 11/12/2017 08:03   Ct Head Code Stroke Wo Contrast  Result Date: 11/12/2017 CLINICAL DATA:  Code stroke.  Speech disturbance. EXAM: CT HEAD WITHOUT CONTRAST TECHNIQUE: Contiguous axial images were obtained from the base of the skull through the vertex without intravenous contrast. COMPARISON:  Head CT 03/29/2017 FINDINGS: Brain: There is no mass, hemorrhage or extra-axial collection. There is generalized atrophy without lobar predilection. Old infarcts of the right frontal lobe, bilateral centrum semiovale and bilateral basal ganglia. There is hypoattenuation of the  periventricular white matter, most commonly indicating chronic ischemic microangiopathy. Vascular: Atherosclerotic calcification of the internal carotid arteries at the skull base. No abnormal hyperdensity of the major intracranial arteries or dural  venous sinuses. Skull: The visualized skull base, calvarium and extracranial soft tissues are normal. Sinuses/Orbits: No fluid levels or advanced mucosal thickening of the visualized paranasal sinuses. No mastoid or middle ear effusion. The orbits are normal. ASPECTS [facility] Stroke Program Early CT Score) - Ganglionic level infarction (caudate, lentiform nuclei, internal capsule, insula, M1-M3 cortex): 7 - Supraganglionic infarction (M4-M6 cortex): 3 Total score (0-10 with 10 being normal): 10 IMPRESSION: 1. No hemorrhage or mass effect. 2. Multiple old infarcts, chronic small vessel disease and generalized atrophy. 3. ASPECTS is 10. 4. These results were called by telephone at the time of interpretation on 11/12/2017 at 6:38 am to Dr. Charlesetta Ivory , who verbally acknowledged these results. Electronically Signed   By: Ulyses Jarred M.D.   On: 11/12/2017 06:39    EKG: Orders placed or performed during the hospital encounter of 11/12/17  . EKG 12-Lead  . EKG 12-Lead  . EKG 12-Lead  . EKG 12-Lead  . ED EKG  . ED EKG    IMPRESSION AND PLAN: Patient is 84 year old brought in from the hospital due to altered mental status recently had a stroke but was not admitted  1.  Altered mental status with decrease in responsiveness  possibly due to stroke We will obtain MRI of the brain Neurology consult Continue aspirin Plavix Swallow screen PT evaluation Echocardiogram of the heart  2.  Diabetes type 2 with low blood sugars earlier this morning hold Lantus continue metformin monitor blood sugars patient may need low-dose Lantus  3.  Hypothyroidism continue Synthroid  4.  Essential hypertension continue lisinopril  5.  Diabetic neuropathy continue  gabapentin  6.  Hyperlipidemia continue simvastatin  7.  BPH continue Flomax   All the records are reviewed and case discussed with ED provider. Management plans discussed with the patient, family and they are in agreement.  CODE STATUS: Code Status History    Date Active Date Inactive Code Status Order ID Comments User Context   07/17/2016 1144 07/17/2016 1641 DNR [id number]  Fritzi Mandes, MD Inpatient   07/16/2016 0900 07/17/2016 1144 Full Code [id number]  Saundra Shelling, MD Inpatient   05/18/2016 0357 05/19/2016 1438 Full Code [id number]  Harvie Bridge, DO Inpatient   09/10/2015 1604 09/12/2015 1946 Full Code [id number]  Nicholes Mango, MD Inpatient    Questions for Most Recent Historical Code Status (Order [id number])    Question Answer Comment   In the event of cardiac or respiratory ARREST Do not call a "code blue"    In the event of cardiac or respiratory ARREST Do not perform Intubation, CPR, defibrillation or ACLS    In the event of cardiac or respiratory ARREST Use medication by any route, position, wound care, and other measures to relive pain and suffering. May use oxygen, suction and manual treatment of airway obstruction as needed for comfort.        TOTAL TIME TAKING CARE OF THIS PATIENT: 55 minutes.    Dustin Flock M.D on 11/12/2017 at 8:20 AM  Between 7am to 6pm - Pager - [phone]  After 6pm go to [email or web address] - password [email or web address] [facility]  [phone]  CC: Primary care physician; Derinda Late, MD

## 2017-11-12 NOTE — ED Notes (Signed)
This RN to bedside at this time to introduce self to patient and wife. Pt is alert and tracking this RN with eyes across room. Pt answering with 1 word answers appropriately but slowly. Pt's wife remains at bedside at this time, explained delay in waiting for CT results. Will continue to monitor for further patient needs.

## 2017-11-12 NOTE — ED Notes (Signed)
Dr. Patel at bedside at this time.

## 2017-11-12 NOTE — Progress Notes (Signed)
CODE STROKE- PHARMACY COMMUNICATION   Time CODE STROKE called/page received: [id number]  Time response to CODE STROKE was made (in person or via phone):   Time Stroke Kit retrieved from [place] (only if needed): n/a  Name of Provider/Nurse contacted:   Past Medical History:  Diagnosis Date  . Arthritis   . BPH (benign prostatic hyperplasia)   . DM (diabetes mellitus) ([place])   . Hyperlipidemia   . Hypertension   . Hypothyroidism   . Osteoporosis   . Pelvic fracture ([place])   . TIA (transient ischemic attack) 1998, 2014   Prior to Admission medications   Medication Sig Start Date End Date Taking? Authorizing Provider  ACCU-CHEK SOFTCLIX LANCETS lancets Use as instructed twice daily 12/16/15   [provider]  aspirin EC 81 MG tablet Take 81 mg by mouth daily.    [provider]  calcium gluconate 500 MG tablet Take 2 tablets 2 (two) times daily by mouth.    [provider]  calcium-vitamin D (OSCAL WITH D) 500-200 MG-UNIT tablet Take 1 tablet by mouth daily.    [provider]  clopidogrel (PLAVIX) 75 MG tablet Take 75 mg by mouth daily. 07/11/15   [provider]  dextromethorphan (DELSYM) 30 MG/5ML liquid Take 5 mLs (30 mg total) by mouth 2 (two) times daily. Patient not taking: Reported on 07/12/2016 05/19/16   Theodoro Grist, MD  donepezil (ARICEPT) 5 MG tablet Take 5 mg at bedtime by mouth.    [provider]  feeding supplement, GLUCERNA SHAKE, (GLUCERNA SHAKE) LIQD Take 237 mLs by mouth 2 (two) times daily between meals. 05/19/16   Theodoro Grist, MD  finasteride (PROSCAR) 5 MG tablet Take 5 mg by mouth daily.    [provider]  gabapentin (NEURONTIN) 300 MG capsule Take by mouth. 07/10/16 07/10/17  [provider]  hydrocortisone (ANUSOL-HC) 25 MG suppository Place rectally.    [provider]  insulin aspart (NOVOLOG) 100 UNIT/ML FlexPen Inject 5 Units into the skin 3 (three) times daily with meals. Patient not  taking: Reported on 07/12/2016 05/19/16   Theodoro Grist, MD  LANTUS SOLOSTAR 100 UNIT/ML Solostar Pen Inject 18 Units into the skin every morning.  06/13/15   [provider]  levothyroxine (SYNTHROID, LEVOTHROID) 25 MCG tablet Take 1 tablet by mouth daily. 04/26/16   [provider]  lisinopril (PRINIVIL,ZESTRIL) 20 MG tablet Take 10 mg by mouth daily.  07/11/15   [provider]  meclizine (ANTIVERT) 25 MG tablet Take 1 tablet (25 mg total) by mouth 3 (three) times daily as needed. Patient not taking: Reported on 07/12/2016 05/30/16   Merlyn Lot, MD  metFORMIN (GLUCOPHAGE) 500 MG tablet Take 1,000 mg by mouth 2 (two) times daily.  08/12/15   [provider]  pioglitazone (ACTOS) 15 MG tablet Take by mouth. 07/09/16 07/09/17  [provider]  polyethylene glycol (MIRALAX / GLYCOLAX) packet Take 17 g by mouth daily. Patient taking differently: Take 17 g daily as needed by mouth.  09/12/15   Loletha Grayer, MD  senna-docusate (SENOKOT-S) 8.6-50 MG tablet Take 1 tablet by mouth at bedtime as needed for mild constipation. 09/12/15   Loletha Grayer, MD  simvastatin (ZOCOR) 20 MG tablet Take 20 mg by mouth at bedtime.  07/11/15   [provider]  tamsulosin (FLOMAX) 0.4 MG CAPS capsule Take 0.4 mg by mouth 2 (two) times daily. 08/08/15   [provider]  vitamin B-12 (CYANOCOBALAMIN) 1000 MCG tablet Take 1,000 mcg daily by  mouth.    [provider]    Tobie Lords ,PharmD Clinical Pharmacist  11/12/2017  6:45 AM

## 2017-11-12 NOTE — ED Notes (Signed)
Pt returned from CT at this time.  

## 2017-11-12 NOTE — Evaluation (Signed)
Physical Therapy Evaluation Patient Details Name: Brian Barry MRN: [id number] DOB: 25-Mar-1930 Today's Date: 11/12/2017   History of Present Illness  84 y.o. male with a known history of osteoarthritis, BPH, diabetes, hyperlipidemia, essential hypertension, hypothyroidism, dementia previous TIA who is presenting to the hospital with altered mental status.  Clinical Impression  Pt did relatively well with PTexam and additional 10 minutes of gait training.  Pt's CT was negative for acute infarct, pt to have MRI.  He was able to get himself sitting to EOB, and get to standing w/o direct assist.  He had poor posture and needed consistent cuing during ambulation and did have inconsistent ability to maintain cadence but did have to alter pattern secondary to R foot "drop" issues.  Pt was having HHPT prior to admission due to recent TIA, continued HHPT indicated for pt on d/c.     Follow Up Recommendations Home health PT    Equipment Recommendations  None recommended by PT    Recommendations for Other Services       Precautions / Restrictions Precautions Precautions: Fall Restrictions Weight Bearing Restrictions: No      Mobility  Bed Mobility Overal bed mobility: Modified Independent             General bed mobility comments: Pt able to get himself to EOB, maintain balance and don sock w/o assist  Transfers Overall transfer level: Modified independent Equipment used: Rolling walker (2 wheeled)             General transfer comment: Pt is able to rise to standing with light cues for hand placement, able to rise to standing.  Ambulation/Gait Ambulation/Gait assistance: Supervision Gait Distance (Feet): 200 Feet Assistive device: Rolling walker (2 wheeled)       General Gait Details: Pt with slow but safe cadence for prolonged bout of ambulation.  He did need consistent cuing for posture, head up and appropriate walker use but ultimately did not have any LOBs and is not  too far from his baseline (per daughter)  [employer] [place] (Stroke Patients Only)       Balance Overall balance assessment: Modified Independent(reliant on walker, no LOBs or overt unsteadiness in standing)                                           Pertinent Vitals/Pain Pain Assessment: No/denies pain    Home Living Family/patient expects to be discharged to:: ([place]) Living Arrangements: Spouse/significant other                    Prior Function Level of Independence: Independent with assistive device(s)         Comments: Pt does need safety/directional assist 2/2 dementia but can dress, walk, use bathroom, etc     Hand Dominance        Extremity/Trunk Assessment   Upper Extremity Assessment Upper Extremity Assessment: Overall WFL for tasks assessed    Lower Extremity Assessment Lower Extremity Assessment: Overall WFL for tasks assessed(R ankle DF to neutral, weaker than L)       Communication   Communication: No difficulties  Cognition Arousal/Alertness: Awake/alert Behavior During Therapy: Flat affect Overall Cognitive Status: History of cognitive impairments - at baseline  General Comments: daughter reports that he is not following instructions quite as well as normally.      General Comments      Exercises     Assessment/Plan    PT Assessment Patient needs continued PT services  PT Problem List Decreased strength;Decreased range of motion;Decreased activity tolerance;Decreased balance;Decreased mobility;Decreased coordination;Decreased knowledge of use of DME;Decreased safety awareness       PT Treatment Interventions DME instruction;Gait training;Stair training;Functional mobility training;Therapeutic exercise;Therapeutic activities;Balance training;Neuromuscular re-education;Cognitive  remediation;Patient/family education    PT Goals (Current goals can be found in the Care Plan section)  Acute Rehab PT Goals Patient Stated Goal: none states, daughter hopes to see him go back to ILF soon PT Goal Formulation: With patient Time For Goal Achievement: 11/26/17 Potential to Achieve Goals: Good    Frequency Min 2X/week   Barriers to discharge        Co-evaluation               AM-PAC PT "6 Clicks" Daily Activity  Outcome Measure Difficulty turning over in bed (including adjusting bedclothes, sheets and blankets)?: None Difficulty moving from lying on back to sitting on the side of the bed? : A Little Difficulty sitting down on and standing up from a chair with arms (e.g., wheelchair, bedside commode, etc,.)?: A Little Help needed moving to and from a bed to chair (including a wheelchair)?: None Help needed walking in hospital room?: None Help needed climbing 3-5 steps with a railing? : A Little 6 Click Score: 21    End of Session Equipment Utilized During Treatment: Gait belt Activity Tolerance: Patient tolerated treatment well Patient left: with call bell/phone within reach;with bed alarm set;with family/visitor present   PT Visit Diagnosis: Muscle weakness (generalized) (M62.81);Difficulty in walking, not elsewhere classified (R26.2)    Time: 5809-9833 PT Time Calculation (min) (ACUTE ONLY): 27 min   Charges:   PT Evaluation $PT Eval Low Complexity: 1 Low PT Treatments $Gait Training: 8-22 mins   PT G Codes:        Kreg Shropshire, DPT 11/12/2017, 1:36 PM

## 2017-11-12 NOTE — Progress Notes (Signed)
Notified unit secretary of need for low bed.

## 2017-11-12 NOTE — Progress Notes (Signed)
OT Cancellation Note  Patient Details Name: Brian Barry MRN: [id number] DOB: 03/20/1930   Cancelled Treatment:    Reason Eval/Treat Not Completed: Patient at procedure or test/ unavailable. Order received, chart reviewed. Pt receiving echocardiogram upon initial attempt. Will re-attempt OT evaluation at later date/time as pt is available and medically appropriate.   Jeni Salles, MPH, MS, OTR/L ascom [phone] 11/12/17, 1:36 PM

## 2017-11-12 NOTE — ED Triage Notes (Signed)
Pt bib [facility] from home ([facility]) where wife called stating pt starting acting inappropriately 45 minutes prior to EMS arrival. After speaking with wife, she states last known normal was approx 11pm last night before bed. Wife states hx TIAs, currently taking plavix and asa and mri 2 weeks, with + bleeding on left side of brain-wife unsure details. Pt unable to follow most commands and only oriented to self. VSS, equal unlabored respirations

## 2017-11-12 NOTE — Evaluation (Signed)
Occupational Therapy Evaluation Patient Details Name: Brian Barry MRN: [id number] DOB: 08/25/1929 Today's Date: 11/12/2017    History of Present Illness 84 y.o. male with a known history of osteoarthritis, BPH, diabetes, hyperlipidemia, essential hypertension, hypothyroidism, dementia previous TIA who is presenting to the hospital with altered mental status.   Clinical Impression   Pt seen for OT evaluation this date. Prior to hospital admission, pt was supervision level with verbal cues for safety/directional assist for basic ADL and mobility using a rollator.  Pt lives with his spouse at [facility]. She provides supervision assist for pt and medication mgt, meals, IADL assist as needed. Currently pt demonstrates mild  impairments in balance and safety awareness requiring SBA to CGA assist for basic ADL and CGA for mobility with RW as well as occasional verbal cues for safety.  Pt would benefit from skilled OT services in the home to address noted impairments and functional limitations (see below for any additional details) in order to maximize safety and independence while minimizing falls risk and caregiver burden, including home safety, caregiver training/education, and maximizing pt's return to daily habits and routines with spouse.  Upon hospital discharge, recommend pt discharge to home with [facility] services.     Follow Up Recommendations  Home health OT    Equipment Recommendations  None recommended by OT    Recommendations for Other Services       Precautions / Restrictions Precautions Precautions: Fall Restrictions Weight Bearing Restrictions: No      Mobility Bed Mobility Overal bed mobility: Modified Independent             General bed mobility comments: Pt able to get himself to EOB, maintain balance and adjust socks w/o assist or initiation cues from OT  Transfers Overall transfer level: Needs assistance   Transfers: Sit to/from Stand Sit to Stand:  Min guard;Min assist         General transfer comment: verbal cues for hand/foot placement, attempted reaching for the walker requiring redirection, ultimately required min assist on initial attempt, decreasing to CGA with 2nd attempt and a little forward momentum from pt    Balance Overall balance assessment: Mild deficits observed, not formally tested                                         ADL either performed or assessed with clinical judgement   ADL Overall ADL's : Needs assistance/impaired                                       General ADL Comments: at baseline is supervision level, currently SBA to CGA during transitional movements/standing     Vision Baseline Vision/History: Wears glasses Wears Glasses: Reading only Patient Visual Report: No change from baseline Vision Assessment?: No apparent visual deficits     Perception     Praxis      Pertinent Vitals/Pain Pain Assessment: No/denies pain     Hand Dominance     Extremity/Trunk Assessment Upper Extremity Assessment Upper Extremity Assessment: Overall WFL for tasks assessed   Lower Extremity Assessment Lower Extremity Assessment: Overall WFL for tasks assessed;Defer to PT evaluation(R ankle DF to neutral, weaker than L)   Cervical / Trunk Assessment Cervical / Trunk Assessment: Normal   Communication Communication Communication: No difficulties  Cognition Arousal/Alertness: Awake/alert Behavior During Therapy: Flat affect Overall Cognitive Status: History of cognitive impairments - at baseline                                 General Comments: follows all commands with slightly additional time, cues for safety/sequencing, alert and oriented to self, place, family   General Comments       Exercises     Shoulder Instructions      Home Living Family/patient expects to be discharged to:: Other (Comment)([facility] [place]) Living Arrangements:  Spouse/significant other                                      Prior Functioning/Environment Level of Independence: Independent with assistive device(s);Needs assistance  Gait / Transfers Assistance Needed: ambulates with rollator with PRN VC from spouse for safety/directional assist 2/2 dementia ADL's / Homemaking Assistance Needed: Pt does need safety/directional assist 2/2 dementia but can perform basic ADL tasks with remote supervision/PRN verbal cues from spouse.   Comments: no falls in past 12 months        OT Problem List: Decreased cognition;Impaired balance (sitting and/or standing);Decreased safety awareness      OT Treatment/Interventions: Self-care/ADL training;Balance training;Therapeutic exercise;Therapeutic activities;DME and/or AE instruction;Cognitive remediation/compensation;Patient/family education    OT Goals(Current goals can be found in the care plan section) Acute Rehab OT Goals Patient Stated Goal: none states, spouse and daughter hopes to see him go back to ILF soon OT Goal Formulation: With patient/family Time For Goal Achievement: 11/26/17 Potential to Achieve Goals: Good ADL Goals Pt Will Perform Lower Body Dressing: with supervision;sit to/from stand(min verbal cues from caregiver for safety) Pt Will Transfer to Toilet: with supervision;ambulating(min verbal cues from caregiver for safety, LRAD for amb)  OT Frequency: Min 1X/week   Barriers to D/C:            Co-evaluation              AM-PAC PT "6 Clicks" Daily Activity     Outcome Measure Help from another person eating meals?: None Help from another person taking care of personal grooming?: None Help from another person toileting, which includes using toliet, bedpan, or urinal?: A Little Help from another person bathing (including washing, rinsing, drying)?: A Little Help from another person to put on and taking off regular upper body clothing?: None Help from another  person to put on and taking off regular lower body clothing?: A Little 6 Click Score: 21   End of Session Equipment Utilized During Treatment: Gait belt;Rolling walker  Activity Tolerance: Patient tolerated treatment well Patient left: in bed;with call bell/phone within reach;with bed alarm set;with family/visitor present  OT Visit Diagnosis: Other abnormalities of gait and mobility (R26.89)                Time: 8016-5537 OT Time Calculation (min): 22 min Charges:  OT General Charges $OT Visit: 1 Visit OT Evaluation $OT Eval Low Complexity: 1 Low  Jeni Salles, MPH, MS, OTR/L ascom [phone] 11/12/17, 5:57 PM

## 2017-11-12 NOTE — Evaluation (Addendum)
Clinical/Bedside Swallow Evaluation Patient Details  Name: Brian Barry MRN: [id number] Date of Birth: 02/09/1930  Today's Date: 11/12/2017 Time: SLP Start Time (ACUTE ONLY): 47 SLP Stop Time (ACUTE ONLY): 1344 SLP Time Calculation (min) (ACUTE ONLY): 46 min  Past Medical History:  Past Medical History:  Diagnosis Date  . Arthritis   . BPH (benign prostatic hyperplasia)   . DM (diabetes mellitus) ([place])   . Hyperlipidemia   . Hypertension   . Hypothyroidism   . Osteoporosis   . Pelvic fracture ([place])   . TIA (transient ischemic attack) 1998, 2014   Past Surgical History:  Past Surgical History:  Procedure Laterality Date  . bilateral hip replacement Bilateral 1998  . Left knee replacement Left 2007  . LUMBAR LAMINECTOMY  2010  . right hip revision Right 2008   HPI:  Pt is a 84 y.o. male with a known history of Dementia per MD/chart, osteoarthritis, BPH, diabetes, hyperlipidemia, essential hypertension, hypothyroidism, previous TIAs who is presenting to the hospital with altered mental status.  Patient was in his usual state of health last night however when he woke up this morning he was more confused according to his wife and having difficulty answering questions.  Even when he arrived in the ED, patient was not really responding to any questions.  Now, he is doing better and talking, answering questions, following commands.  According to his wife he uses a walker to walk.  He has a chronic left-sided foot drop.  MRI last admission revealed Right frontal lobe encephalomalacia; Moderate to Severe parenchymal brain volume loss; multiple areas of infarct in the pontine, basal ganglia, parieto-occipital area.  Pt's wife has noted pt's verbal engagement to be less and taking more time in the past few months.  Wife denied any difficulty swallowing at home.    Assessment / Plan / Recommendation Clinical Impression  Pt appears to present w/ adequate oropharyngeal phase swallow function w/  reduced risk for aspiration when following general aspiration precautions. Pt positioned himself to sit EOB then consumed po trials of thin liquids and purees. No overt s/s of aspiration were noted; clear vocal quality and no decline in respiratory status noted during/post trials. Pt exhibited adequate timing of the swallowing and clearing w/ these trials. Oral phase appeared [facility] for bolus management and oral clearing w/ these consistencies; timely bolus management and A-P transfer was apparent. However, pt was unable to place his Upper Partial plate therefore did not eat any solid food trials d/t reduced ability to effectively masticate solids w/out full dentition. Pt and Wife denied any difficulty chewing/masticating a regular diet at home just prior to admission; endorsed that pt does wear full Partial Plates at home. OM exam revealed slight-min LU lip decreased tone but this did not appear to impact his labial seal during drinking liquids via straw, or pulling foods from utensil cleanly. Pt fed self w/ min setup given as support.  Pt has a baseline of Dementia and multiple infarcts per MRI/Dtr. He appears to present w/ reduced verbal engagement w/ slower response time, however, speech is Clear. Suspect pt's Cognitive decline is impacting his Language skills w/ impact from multiple infarcts as well. Pt is able to follow through w/ general ADL tasks re: his dentition and self-feeding at meal. Pt could benefit from a formal Cognitive/linguistic evaluation at his next setting post discharge - when pt is in a more structured, quiet setting w/ less distractions to allow him to better focus on the tasks. Family/pt could  benefit from Education as well as Education on strategies to enhance his language in his home setting.  Recommend continue w/ current Regular diet w/ thin liquids; general aspiration precautions. Pills w/ liquids or in Puree as needed for safe swallowing d/t Cognitive decline. NSG updated and agreed.  CM updated. No further skilled ST services indicated for swallowing at this time. NSG to reconsult if decline in status while admitted. Family agreed. SLP Visit Diagnosis: Dysphagia, unspecified (R13.10)    Aspiration Risk  (reduced following general precautions d/t baseline Dementia)    Diet Recommendation  continue w/ Regular diet w/ thin liquids; general aspiration precautions; reduce distractions at meals  Medication Administration: Whole meds with liquid(or whole in puree if needed for safer swallowing d/t Cog.)    Other  Recommendations Recommended Consults: (n/a) Oral Care Recommendations: Oral care BID;Patient independent with oral care Other Recommendations: (n/a)   Follow up Recommendations None      Frequency and Duration (n/a)  (n/a)       Prognosis Prognosis for Safe Diet Advancement: Good Barriers to Reach Goals: Cognitive deficits(baseline)      Swallow Study   General Date of Onset: 11/12/17 HPI: Pt is a 84 y.o. male with a known history of Dementia per MD/chart, osteoarthritis, BPH, diabetes, hyperlipidemia, essential hypertension, hypothyroidism, previous TIAs who is presenting to the hospital with altered mental status.  Patient was in his usual state of health last night however when he woke up this morning he was more confused according to his wife and having difficulty answering questions.  Even when he arrived in the ED, patient was not really responding to any questions.  Now, he is doing better and talking, answering questions, following commands.  According to his wife he uses a walker to walk.  He has a chronic left-sided foot drop.  MRI last admission revealed Right frontal lobe encephalomalacia; Moderate to Severe parenchymal brain volume loss; multiple areas of infarct in the pontine, basal ganglia, parieto-occipital area.  Pt's wife has noted pt's verbal engagement to be less and taking more time in the past few months.  Wife denied any difficulty  swallowing at home.  Type of Study: Bedside Swallow Evaluation Previous Swallow Assessment: ~1 year ago during admission Diet Prior to this Study: Regular;Thin liquids Temperature Spikes Noted: No(wbc 5.5) Respiratory Status: Room air History of Recent Intubation: No Behavior/Cognition: Alert;Cooperative;Pleasant mood;Confused;Distractible;Requires cueing(min+) Oral Cavity Assessment: Within Functional Limits Oral Care Completed by SLP: No Oral Cavity - Dentition: (Top/Bottom partial denture plates) Vision: Functional for self-feeding Self-Feeding Abilities: Able to feed self;Needs set up Patient Positioning: Upright in bed(EOB) Baseline Vocal Quality: Normal(few words spoken) Volitional Cough: Strong Volitional Swallow: Able to elicit    Oral/Motor/Sensory Function Overall Oral Motor/Sensory Function: Mild impairment(slight decreased tone in Left upper lip) Facial ROM: Reduced left(slight) Facial Symmetry: Within Functional Limits(grossly) Facial Strength: Reduced left(slight) Facial Sensation: Within Functional Limits Lingual ROM: Within Functional Limits Lingual Symmetry: Within Functional Limits Lingual Strength: Within Functional Limits Lingual Sensation: Within Functional Limits Velum: Within Functional Limits Mandible: Within Functional Limits   Ice Chips Ice chips: Not tested   Thin Liquid Thin Liquid: Within functional limits Presentation: Self Fed;Straw(6+ sips/swallows)    Nectar Thick Nectar Thick Liquid: Not tested   Honey Thick Honey Thick Liquid: Not tested   Puree Puree: Within functional limits Presentation: Self Fed;Spoon(~3 ozs total)   Solid   GO   Solid: Not tested Other Comments: d/t not being able to place upper partial plate yet  Orinda Kenner, MS, CCC-SLP [place] 11/12/2017,1:52 PM

## 2017-11-12 NOTE — Progress Notes (Signed)
Advanced care plan.  Purpose of the Encounter: CODE STATUS  Parties in Attendance: Patient's wife and patient  Patient's Decision Capacity: Not intact  Subjective/Patient's story: Brian Barry  is a 84 y.o. male with a known history of osteoarthritis, BPH, diabetes, hyperlipidemia, essential hypertension, hypothyroidism, previous TIA who is presenting to the hospital with altered mental status     Objective/Medical story I discussed with the patient and his wife regarding CODE STATUS.  He does not wish to be resuscitated or intubated.   Goals of care determination:  DNR   CODE STATUS: DNR   Time spent discussing advanced care planning: 16 minutes

## 2017-11-13 DIAGNOSIS — E785 Hyperlipidemia, unspecified: Secondary | ICD-10-CM | POA: Diagnosis not present

## 2017-11-13 DIAGNOSIS — E039 Hypothyroidism, unspecified: Secondary | ICD-10-CM | POA: Diagnosis not present

## 2017-11-13 DIAGNOSIS — Z96643 Presence of artificial hip joint, bilateral: Secondary | ICD-10-CM | POA: Diagnosis not present

## 2017-11-13 DIAGNOSIS — G9340 Encephalopathy, unspecified: Secondary | ICD-10-CM | POA: Diagnosis not present

## 2017-11-13 DIAGNOSIS — E119 Type 2 diabetes mellitus without complications: Secondary | ICD-10-CM | POA: Diagnosis not present

## 2017-11-13 DIAGNOSIS — I672 Cerebral atherosclerosis: Secondary | ICD-10-CM | POA: Diagnosis not present

## 2017-11-13 DIAGNOSIS — I10 Essential (primary) hypertension: Secondary | ICD-10-CM | POA: Diagnosis not present

## 2017-11-13 DIAGNOSIS — Z7982 Long term (current) use of aspirin: Secondary | ICD-10-CM | POA: Diagnosis not present

## 2017-11-13 DIAGNOSIS — R4701 Aphasia: Secondary | ICD-10-CM | POA: Diagnosis not present

## 2017-11-13 DIAGNOSIS — G9349 Other encephalopathy: Secondary | ICD-10-CM | POA: Diagnosis not present

## 2017-11-13 DIAGNOSIS — E1140 Type 2 diabetes mellitus with diabetic neuropathy, unspecified: Secondary | ICD-10-CM | POA: Diagnosis not present

## 2017-11-13 DIAGNOSIS — N400 Enlarged prostate without lower urinary tract symptoms: Secondary | ICD-10-CM | POA: Diagnosis not present

## 2017-11-13 DIAGNOSIS — G459 Transient cerebral ischemic attack, unspecified: Secondary | ICD-10-CM | POA: Diagnosis not present

## 2017-11-13 LAB — LIPID PANEL
CHOL/HDL RATIO: 3.3 ratio
CHOLESTEROL: 105 mg/dL (ref 0–200)
HDL: 32 mg/dL — ABNORMAL LOW (ref >40)
LDL Cholesterol: 49 mg/dL (ref 0–99)
TRIGLYCERIDES: 119 mg/dL (ref <150)
VLDL: 24 mg/dL (ref 0–40)

## 2017-11-13 LAB — HEMOGLOBIN A1C
Hgb A1c MFr Bld: 7.8 % — ABNORMAL HIGH (ref 4.8–5.6)
Mean Plasma Glucose: 177.16 mg/dL

## 2017-11-13 LAB — GLUCOSE, CAPILLARY: GLUCOSE-CAPILLARY: 121 mg/dL — AB (ref 70–99)

## 2017-11-13 NOTE — Care Management (Signed)
Discharge to home today per Dr. Posey Pronto. Requested resumption of home health services per Dr. Posey Pronto. Followed by [facility].  Family will transport. Shelbie Ammons RN MSN CCM Care Management [phone]

## 2017-11-13 NOTE — Discharge Summary (Signed)
Brian Barry at [facility], 84 y.o., DOB [birth year], MRN [id number]. Admission date: 11/12/2017 Discharge Date 11/13/2017 Primary MD Derinda Late, MD Admitting Physician Dustin Flock, MD  Admission Diagnosis  Aphasia [R47.01] Cerebrovascular accident (CVA), unspecified mechanism [facility]) [I63.9]  Discharge Diagnosis   Active Problems: Acute encephalopathy due to TIA Diabetes type 2 Hypothyroid Essential hypertension Diabetic neuropathy Hyperlipidemia BPH  Hospital Course  Patient is 84 year old brought in from the hospital due to altered mental status recently had a stroke but was not admitted.  Patient was not able to speak and communicate.  Initial evaluation in the ER was negative.  There was some concern for stroke and/or TIA.  Patient was admitted to the hospital his mental status returned back to baseline.  He underwent a work-up which showed old stroke.  Patient's symptoms were consistent with a TIA.  Patient is already on aspirin Plavix which she needs to continue.  Echocardiogram showed no evidence of thrombus.             Consults  None  Significant Tests:  See full reports for all details     Ct Angio Head W Or Wo Contrast  Result Date: 11/12/2017 CLINICAL DATA:  Mental status changes beginning a acutely. No acute findings by head CT. Extensive old ischemic changes. EXAM: CT ANGIOGRAPHY HEAD AND NECK CT PERFUSION BRAIN TECHNIQUE: Multidetector CT imaging of the head and neck was performed using the standard protocol during bolus administration of intravenous contrast. Multiplanar CT image reconstructions and MIPs were obtained to evaluate the vascular anatomy. Carotid stenosis measurements (when applicable) are obtained utilizing NASCET criteria, using the distal internal carotid diameter as the denominator. Multiphase CT imaging of the brain was performed following IV bolus contrast injection. Subsequent parametric perfusion  maps were calculated using RAPID software. CONTRAST:  137mL ISOVUE-370 IOPAMIDOL (ISOVUE-370) INJECTION 76% COMPARISON:  CT earlier same day.  MRI 10/18/2017. FINDINGS: CTA NECK FINDINGS Aortic arch: Aortic atherosclerosis. No aneurysm or dissection. Branching pattern of the brachiocephalic vessels from the arch is normal without origin stenosis. Right carotid system: Common carotid artery widely patent to the bifurcation region. Atherosclerotic plaque at the carotid bifurcation and ICA bulb but no stenosis. Cervical ICA widely patent. Left carotid system: Common carotid artery widely patent to the bifurcation region. Atherosclerotic plaque at the carotid bifurcation and ICA bulb. Minimal diameter of the distal bulb is 4 mm. Compared to a more distal cervical ICA diameter of 5 mm, this indicates a 20% stenosis. Vertebral arteries: Right vertebral artery origin widely patent. Right vertebral artery is widely patent through the cervical region to the foramen magnum. Calcified plaque at the left vertebral artery origin. Stenosis estimated at 30-50%. Beyond that, the vessel is tortuous but widely patent to the foramen magnum. Skeleton: Degenerative cervical spondylosis and facet arthropathy. Other neck: No mass or lymphadenopathy. Upper chest: Negative Review of the MIP images confirms the above findings CTA HEAD FINDINGS Anterior circulation: Both internal carotid arteries are patent through the skull base and siphon regions. There is atherosclerotic calcification in both carotid siphon regions with narrowing estimated at 30%. Anterior and middle cerebral vessels are patent without proximal stenosis, aneurysm or vascular malformation. No missing vessels identified. Posterior circulation: Both vertebral arteries widely patent to the basilar. No basilar stenosis. Posterior circulation branch vessels appear normal. Venous sinuses: Patent and normal. Anatomic variants: None significant. Delayed phase: No abnormal  enhancement. Review of the MIP images confirms the above findings CT Brain Perfusion Findings:  CBF (<30%) Volume: 5mL Perfusion (Tmax>6.0s) volume: 30mL Mismatch Volume: 35mL Infarction Location:None Infarct Core: 0 mL Infarction Location:None IMPRESSION: Negative perfusion examination. CT angiography shows atherosclerotic change at the carotid bifurcations but no significant stenosis. 20% stenosis of the distal left ICA bulb. 30% narrowing in both carotid siphon regions. 30-50% stenosis of the left vertebral artery origin, but wide patency beyond that. These results were called by telephone at the time of interpretation on 11/12/2017 at 8:00 am to Dr. Dahlia Client , who verbally acknowledged these results. Electronically Signed   By: Nelson Chimes M.D.   On: 11/12/2017 08:03   Ct Angio Neck W Or Wo Contrast  Result Date: 11/12/2017 CLINICAL DATA:  Mental status changes beginning a acutely. No acute findings by head CT. Extensive old ischemic changes. EXAM: CT ANGIOGRAPHY HEAD AND NECK CT PERFUSION BRAIN TECHNIQUE: Multidetector CT imaging of the head and neck was performed using the standard protocol during bolus administration of intravenous contrast. Multiplanar CT image reconstructions and MIPs were obtained to evaluate the vascular anatomy. Carotid stenosis measurements (when applicable) are obtained utilizing NASCET criteria, using the distal internal carotid diameter as the denominator. Multiphase CT imaging of the brain was performed following IV bolus contrast injection. Subsequent parametric perfusion maps were calculated using RAPID software. CONTRAST:  145mL ISOVUE-370 IOPAMIDOL (ISOVUE-370) INJECTION 76% COMPARISON:  CT earlier same day.  MRI 10/18/2017. FINDINGS: CTA NECK FINDINGS Aortic arch: Aortic atherosclerosis. No aneurysm or dissection. Branching pattern of the brachiocephalic vessels from the arch is normal without origin stenosis. Right carotid system: Common carotid artery widely patent to the  bifurcation region. Atherosclerotic plaque at the carotid bifurcation and ICA bulb but no stenosis. Cervical ICA widely patent. Left carotid system: Common carotid artery widely patent to the bifurcation region. Atherosclerotic plaque at the carotid bifurcation and ICA bulb. Minimal diameter of the distal bulb is 4 mm. Compared to a more distal cervical ICA diameter of 5 mm, this indicates a 20% stenosis. Vertebral arteries: Right vertebral artery origin widely patent. Right vertebral artery is widely patent through the cervical region to the foramen magnum. Calcified plaque at the left vertebral artery origin. Stenosis estimated at 30-50%. Beyond that, the vessel is tortuous but widely patent to the foramen magnum. Skeleton: Degenerative cervical spondylosis and facet arthropathy. Other neck: No mass or lymphadenopathy. Upper chest: Negative Review of the MIP images confirms the above findings CTA HEAD FINDINGS Anterior circulation: Both internal carotid arteries are patent through the skull base and siphon regions. There is atherosclerotic calcification in both carotid siphon regions with narrowing estimated at 30%. Anterior and middle cerebral vessels are patent without proximal stenosis, aneurysm or vascular malformation. No missing vessels identified. Posterior circulation: Both vertebral arteries widely patent to the basilar. No basilar stenosis. Posterior circulation branch vessels appear normal. Venous sinuses: Patent and normal. Anatomic variants: None significant. Delayed phase: No abnormal enhancement. Review of the MIP images confirms the above findings CT Brain Perfusion Findings: CBF (<30%) Volume: 70mL Perfusion (Tmax>6.0s) volume: 52mL Mismatch Volume: 65mL Infarction Location:None Infarct Core: 0 mL Infarction Location:None IMPRESSION: Negative perfusion examination. CT angiography shows atherosclerotic change at the carotid bifurcations but no significant stenosis. 20% stenosis of the distal left ICA  bulb. 30% narrowing in both carotid siphon regions. 30-50% stenosis of the left vertebral artery origin, but wide patency beyond that. These results were called by telephone at the time of interpretation on 11/12/2017 at 8:00 am to Dr. Dahlia Client , who verbally acknowledged these results. Electronically Signed   By:  Nelson Chimes M.D.   On: 11/12/2017 08:03   Mr Brain Wo Contrast  Result Date: 11/12/2017 CLINICAL DATA:  Initial evaluation for acute altered mental status. EXAM: MRI HEAD WITHOUT CONTRAST MRA HEAD WITHOUT CONTRAST TECHNIQUE: Multiplanar, multiecho pulse sequences of the brain and surrounding structures were obtained without intravenous contrast. Angiographic images of the head were obtained using MRA technique without contrast. COMPARISON:  Prior CT from earlier the same day as well as previous MRI from 10/18/2017. FINDINGS: MRI HEAD FINDINGS Brain: Examination mildly degraded by motion artifact. Diffuse prominence of the CSF containing spaces compatible with generalized cerebral atrophy. Moderate chronic small vessel ischemic disease again noted. Multiple remote lacunar infarcts present within the bilateral basal ganglia, thalami, and periventricular white matter. Remote right frontal infarct with associated encephalomalacia and gliosis noted. Scattered remote lacunar infarcts noted within the pons. There has been interval evolution of previously identified small right parieto-occipital white matter infarct, which now demonstrates residual diffusion abnormality but resolved ADC signal loss. No associated hemorrhage or findings to suggest worsening or interval infarction. No other evidence for acute or subacute ischemia. Gray-white matter differentiation otherwise maintained. No acute intracranial hemorrhage. No mass lesion, midline shift or mass effect. Stable ventricular prominence related to global parenchymal volume loss of hydrocephalus. No extra-axial fluid collection. Normal pituitary gland.  Vascular: Major intracranial vascular flow voids are maintained. Skull and upper cervical spine: Craniocervical junction within normal limits. Upper cervical spine normal. Bone marrow signal intensity within normal limits. Small lipoma noted at the right frontal scalp. Sinuses/Orbits: Globes and orbital soft tissues demonstrate no acute finding. Patient status post ocular lens replacement bilaterally. Paranasal sinuses are clear. Small left mastoid effusion. Inner ear structures normal. Other: None. MRA HEAD FINDINGS ANTERIOR CIRCULATION: Distal cervical segments of the internal carotid arteries are patent with antegrade flow. Petrous segments widely patent. Atheromatous irregularity with mild multifocal narrowing through the cavernous siphons without high-grade stenosis. ICA termini widely patent. A1 segments, anterior communicating artery common anterior cerebral arteries widely patent bilaterally. M1 segments patent without stenosis. No proximal M2 occlusion. Distal MCA branches well perfused and symmetric. POSTERIOR CIRCULATION: Vertebral arteries widely patent to the vertebrobasilar junction without stenosis. Left vertebral artery slightly dominant. Patent left PICA. Dominant right AICA. Basilar widely patent to its distal aspect. Short-segment severe left SCA stenosis (series 107, image 12). Both PCA supplied via the basilar. Short-segment severe mid left P2 stenosis (series 106, image 6). PCAs otherwise widely patent to their distal aspects. No intracranial aneurysm. IMPRESSION: MRI HEAD IMPRESSION: 1. No acute intracranial abnormality. 2. Normal expected interval evolution of subacute right parieto-occipital white matter infarct without complication. 3. Chronic right frontal lobe/MCA territory infarct. 4. Moderate to severe chronic small vessel ischemic disease with multiple remote lacunar infarcts as above. MRA HEAD IMPRESSION: 1. Stable intracranial MRA as compared to prior CTA from earlier the same day.  No large vessel occlusion. 2. Short-segment severe left SCA and P2 stenoses. 3. Mild atherosclerotic change within the carotid siphons without high-grade stenosis. Electronically Signed   By: Jeannine Boga M.D.   On: 11/12/2017 15:18   Mr Jeri Cos OE Contrast  Result Date: 10/18/2017 CLINICAL DATA:  Confusion for 3 weeks. History of vascular dementia, hypertension, hyperlipidemia, diabetes. EXAM: MRI HEAD WITHOUT AND WITH CONTRAST TECHNIQUE: Multiplanar, multiecho pulse sequences of the brain and surrounding structures were obtained without and with intravenous contrast. CONTRAST:  29mL MULTIHANCE GADOBENATE DIMEGLUMINE 529 MG/ML IV SOLN COMPARISON:  MRI of the head July 16, 2016 FINDINGS: INTRACRANIAL CONTENTS:  10 x 8 mm reduced diffusion right parieto-occipital white matter with low ADC values. Chronic microhemorrhage left temporal lobe and bilateral thalami. Old pontine, bilateral basal ganglia and bilateral thalami small infarcts. Right frontal lobe encephalomalacia. Moderate to severe parenchymal brain volume loss. Patchy to confluent supratentorial white matter FLAIR T2 hyperintensities. No hydrocephalus. No midline shift, mass effect or masses. No abnormal extra-axial fluid collections. Similar mildly prominent bifrontal extra-axial fluid spaces. No abnormal intraparenchymal or extra-axial enhancement. VASCULAR: Normal major intracranial vascular flow voids present at skull base. SKULL AND UPPER CERVICAL SPINE: No abnormal sellar expansion. No suspicious calvarial bone marrow signal. Craniocervical junction maintained. Small right frontal scalp lipoma. SINUSES/ORBITS: Minimal left mastoid effusion. Trace paranasal sinus mucosal thickening.The included ocular globes and orbital contents are non-suspicious. Status post bilateral ocular lens implants. OTHER: None. IMPRESSION: 1. Acute small right parieto-occipital white matter infarct. 2. Old small right frontal lobe/MCA territory infarct. Multiple old  small vessel infarcts. 3. Moderate chronic small vessel ischemic changes. 4. Moderate to severe chronic small vessel ischemic changes. 5. These results will be called to the ordering clinician or representative by the Radiologist Assistant, and communication documented in the PACS or zVision Dashboard. Electronically Signed   By: Elon Alas M.D.   On: 10/18/2017 14:45   Ct Cerebral Perfusion W Contrast  Result Date: 11/12/2017 CLINICAL DATA:  Mental status changes beginning a acutely. No acute findings by head CT. Extensive old ischemic changes. EXAM: CT ANGIOGRAPHY HEAD AND NECK CT PERFUSION BRAIN TECHNIQUE: Multidetector CT imaging of the head and neck was performed using the standard protocol during bolus administration of intravenous contrast. Multiplanar CT image reconstructions and MIPs were obtained to evaluate the vascular anatomy. Carotid stenosis measurements (when applicable) are obtained utilizing NASCET criteria, using the distal internal carotid diameter as the denominator. Multiphase CT imaging of the brain was performed following IV bolus contrast injection. Subsequent parametric perfusion maps were calculated using RAPID software. CONTRAST:  136mL ISOVUE-370 IOPAMIDOL (ISOVUE-370) INJECTION 76% COMPARISON:  CT earlier same day.  MRI 10/18/2017. FINDINGS: CTA NECK FINDINGS Aortic arch: Aortic atherosclerosis. No aneurysm or dissection. Branching pattern of the brachiocephalic vessels from the arch is normal without origin stenosis. Right carotid system: Common carotid artery widely patent to the bifurcation region. Atherosclerotic plaque at the carotid bifurcation and ICA bulb but no stenosis. Cervical ICA widely patent. Left carotid system: Common carotid artery widely patent to the bifurcation region. Atherosclerotic plaque at the carotid bifurcation and ICA bulb. Minimal diameter of the distal bulb is 4 mm. Compared to a more distal cervical ICA diameter of 5 mm, this indicates a 20%  stenosis. Vertebral arteries: Right vertebral artery origin widely patent. Right vertebral artery is widely patent through the cervical region to the foramen magnum. Calcified plaque at the left vertebral artery origin. Stenosis estimated at 30-50%. Beyond that, the vessel is tortuous but widely patent to the foramen magnum. Skeleton: Degenerative cervical spondylosis and facet arthropathy. Other neck: No mass or lymphadenopathy. Upper chest: Negative Review of the MIP images confirms the above findings CTA HEAD FINDINGS Anterior circulation: Both internal carotid arteries are patent through the skull base and siphon regions. There is atherosclerotic calcification in both carotid siphon regions with narrowing estimated at 30%. Anterior and middle cerebral vessels are patent without proximal stenosis, aneurysm or vascular malformation. No missing vessels identified. Posterior circulation: Both vertebral arteries widely patent to the basilar. No basilar stenosis. Posterior circulation branch vessels appear normal. Venous sinuses: Patent and normal. Anatomic variants: None significant. Delayed  phase: No abnormal enhancement. Review of the MIP images confirms the above findings CT Brain Perfusion Findings: CBF (<30%) Volume: 62mL Perfusion (Tmax>6.0s) volume: 86mL Mismatch Volume: 79mL Infarction Location:None Infarct Core: 0 mL Infarction Location:None IMPRESSION: Negative perfusion examination. CT angiography shows atherosclerotic change at the carotid bifurcations but no significant stenosis. 20% stenosis of the distal left ICA bulb. 30% narrowing in both carotid siphon regions. 30-50% stenosis of the left vertebral artery origin, but wide patency beyond that. These results were called by telephone at the time of interpretation on 11/12/2017 at 8:00 am to Dr. Dahlia Client , who verbally acknowledged these results. Electronically Signed   By: Nelson Chimes M.D.   On: 11/12/2017 08:03   Mr Jodene Nam Head/brain HB Cm  Result Date:  11/12/2017 CLINICAL DATA:  Initial evaluation for acute altered mental status. EXAM: MRI HEAD WITHOUT CONTRAST MRA HEAD WITHOUT CONTRAST TECHNIQUE: Multiplanar, multiecho pulse sequences of the brain and surrounding structures were obtained without intravenous contrast. Angiographic images of the head were obtained using MRA technique without contrast. COMPARISON:  Prior CT from earlier the same day as well as previous MRI from 10/18/2017. FINDINGS: MRI HEAD FINDINGS Brain: Examination mildly degraded by motion artifact. Diffuse prominence of the CSF containing spaces compatible with generalized cerebral atrophy. Moderate chronic small vessel ischemic disease again noted. Multiple remote lacunar infarcts present within the bilateral basal ganglia, thalami, and periventricular white matter. Remote right frontal infarct with associated encephalomalacia and gliosis noted. Scattered remote lacunar infarcts noted within the pons. There has been interval evolution of previously identified small right parieto-occipital white matter infarct, which now demonstrates residual diffusion abnormality but resolved ADC signal loss. No associated hemorrhage or findings to suggest worsening or interval infarction. No other evidence for acute or subacute ischemia. Gray-white matter differentiation otherwise maintained. No acute intracranial hemorrhage. No mass lesion, midline shift or mass effect. Stable ventricular prominence related to global parenchymal volume loss of hydrocephalus. No extra-axial fluid collection. Normal pituitary gland. Vascular: Major intracranial vascular flow voids are maintained. Skull and upper cervical spine: Craniocervical junction within normal limits. Upper cervical spine normal. Bone marrow signal intensity within normal limits. Small lipoma noted at the right frontal scalp. Sinuses/Orbits: Globes and orbital soft tissues demonstrate no acute finding. Patient status post ocular lens replacement  bilaterally. Paranasal sinuses are clear. Small left mastoid effusion. Inner ear structures normal. Other: None. MRA HEAD FINDINGS ANTERIOR CIRCULATION: Distal cervical segments of the internal carotid arteries are patent with antegrade flow. Petrous segments widely patent. Atheromatous irregularity with mild multifocal narrowing through the cavernous siphons without high-grade stenosis. ICA termini widely patent. A1 segments, anterior communicating artery common anterior cerebral arteries widely patent bilaterally. M1 segments patent without stenosis. No proximal M2 occlusion. Distal MCA branches well perfused and symmetric. POSTERIOR CIRCULATION: Vertebral arteries widely patent to the vertebrobasilar junction without stenosis. Left vertebral artery slightly dominant. Patent left PICA. Dominant right AICA. Basilar widely patent to its distal aspect. Short-segment severe left SCA stenosis (series 107, image 12). Both PCA supplied via the basilar. Short-segment severe mid left P2 stenosis (series 106, image 6). PCAs otherwise widely patent to their distal aspects. No intracranial aneurysm. IMPRESSION: MRI HEAD IMPRESSION: 1. No acute intracranial abnormality. 2. Normal expected interval evolution of subacute right parieto-occipital white matter infarct without complication. 3. Chronic right frontal lobe/MCA territory infarct. 4. Moderate to severe chronic small vessel ischemic disease with multiple remote lacunar infarcts as above. MRA HEAD IMPRESSION: 1. Stable intracranial MRA as compared to prior CTA from earlier  the same day. No large vessel occlusion. 2. Short-segment severe left SCA and P2 stenoses. 3. Mild atherosclerotic change within the carotid siphons without high-grade stenosis. Electronically Signed   By: Jeannine Boga M.D.   On: 11/12/2017 15:18   Ct Head Code Stroke Wo Contrast  Result Date: 11/12/2017 CLINICAL DATA:  Code stroke.  Speech disturbance. EXAM: CT HEAD WITHOUT CONTRAST  TECHNIQUE: Contiguous axial images were obtained from the base of the skull through the vertex without intravenous contrast. COMPARISON:  Head CT 03/29/2017 FINDINGS: Brain: There is no mass, hemorrhage or extra-axial collection. There is generalized atrophy without lobar predilection. Old infarcts of the right frontal lobe, bilateral centrum semiovale and bilateral basal ganglia. There is hypoattenuation of the periventricular white matter, most commonly indicating chronic ischemic microangiopathy. Vascular: Atherosclerotic calcification of the internal carotid arteries at the skull base. No abnormal hyperdensity of the major intracranial arteries or dural venous sinuses. Skull: The visualized skull base, calvarium and extracranial soft tissues are normal. Sinuses/Orbits: No fluid levels or advanced mucosal thickening of the visualized paranasal sinuses. No mastoid or middle ear effusion. The orbits are normal. ASPECTS [facility] Stroke Program Early CT Score) - Ganglionic level infarction (caudate, lentiform nuclei, internal capsule, insula, M1-M3 cortex): 7 - Supraganglionic infarction (M4-M6 cortex): 3 Total score (0-10 with 10 being normal): 10 IMPRESSION: 1. No hemorrhage or mass effect. 2. Multiple old infarcts, chronic small vessel disease and generalized atrophy. 3. ASPECTS is 10. 4. These results were called by telephone at the time of interpretation on 11/12/2017 at 6:38 am to Dr. Charlesetta Ivory , who verbally acknowledged these results. Electronically Signed   By: Ulyses Jarred M.D.   On: 11/12/2017 06:39       Today   Subjective:   Barrie Dunker patient feeling back to baseline Objective:   Blood pressure 131/60, pulse (!) 55, temperature 97.6 F (36.4 C), temperature source Oral, resp. rate 16, height 5\' 8"  (1.727 m), weight 76 kg (167 lb 8.8 oz), SpO2 96 %.  .  Intake/Output Summary (Last 24 hours) at 11/13/2017 1348 Last data filed at 11/13/2017 1031 Gross per 24 hour  Intake 360 ml   Output -  Net 360 ml    Exam VITAL SIGNS: Blood pressure 131/60, pulse (!) 55, temperature 97.6 F (36.4 C), temperature source Oral, resp. rate 16, height 5\' 8"  (1.727 m), weight 76 kg (167 lb 8.8 oz), SpO2 96 %.  GENERAL:  84 y.o.-year-old patient lying in the bed with no acute distress.  EYES: Pupils equal, round, reactive to light and accommodation. No scleral icterus. Extraocular muscles intact.  HEENT: Head atraumatic, normocephalic. Oropharynx and nasopharynx clear.  NECK:  Supple, no jugular venous distention. No thyroid enlargement, no tenderness.  LUNGS: Normal breath sounds bilaterally, no wheezing, rales,rhonchi or crepitation. No use of accessory muscles of respiration.  CARDIOVASCULAR: S1, S2 normal. No murmurs, rubs, or gallops.  ABDOMEN: Soft, nontender, nondistended. Bowel sounds present. No organomegaly or mass.  EXTREMITIES: No pedal edema, cyanosis, or clubbing.  NEUROLOGIC: Cranial nerves II through XII are intact. Muscle strength 5/5 in all extremities. Sensation intact. Gait not checked.  PSYCHIATRIC: The patient is alert and oriented x 3.  SKIN: No obvious rash, lesion, or ulcer.   Data Review     CBC w Diff:  Lab Results  Component Value Date   WBC 5.5 11/12/2017   HGB 11.9 (L) 11/12/2017   HGB 12.7 (L) 09/20/2011   HCT 34.4 (L) 11/12/2017   HCT 37.0 (L) 09/20/2011  PLT 183 11/12/2017   PLT 159 09/20/2011   LYMPHOPCT 13 11/12/2017   MONOPCT 10 11/12/2017   EOSPCT 2 11/12/2017   BASOPCT 1 11/12/2017   CMP:  Lab Results  Component Value Date   NA 130 (L) 11/12/2017   NA 136 09/20/2011   K 4.7 11/12/2017   K 5.0 09/20/2011   CL 93 (L) 11/12/2017   CL 99 09/20/2011   CO2 28 11/12/2017   CO2 31 09/20/2011   BUN 15 11/12/2017   BUN 15 09/20/2011   CREATININE 0.72 11/12/2017   CREATININE 0.77 09/20/2011   PROT 6.7 11/12/2017   ALBUMIN 4.0 11/12/2017   BILITOT 1.0 11/12/2017   ALKPHOS 44 11/12/2017   AST 32 11/12/2017   ALT 15 11/12/2017   .  Micro Results No results found for this or any previous visit (from the past 240 hour(s)).      Code Status Orders  (From admission, onward)        Start     Ordered   11/12/17 1008  Do not attempt resuscitation (DNR)  Continuous    Question Answer Comment  In the event of cardiac or respiratory ARREST Do not call a "code blue"   In the event of cardiac or respiratory ARREST Do not perform Intubation, CPR, defibrillation or ACLS   In the event of cardiac or respiratory ARREST Use medication by any route, position, wound care, and other measures to relive pain and suffering. May use oxygen, suction and manual treatment of airway obstruction as needed for comfort.      11/12/17 1007    Code Status History    Date Active Date Inactive Code Status Order ID Comments User Context   07/17/2016 1144 07/17/2016 1641 DNR [id number]  Fritzi Mandes, MD Inpatient   07/16/2016 0900 07/17/2016 1144 Full Code [id number]  Saundra Shelling, MD Inpatient   05/18/2016 0357 05/19/2016 1438 Full Code [id number]  Harvie Bridge, DO Inpatient   09/10/2015 1604 09/12/2015 1946 Full Code [id number]  Nicholes Mango, MD Inpatient    Advance Directive Documentation     Most Recent Value  Type of Advance Directive  Healthcare Power of Attorney, Living will, Out of facility DNR (pink MOST or yellow form)  Pre-existing out of facility DNR order (yellow form or pink MOST form)  Physician notified to receive inpatient order  "MOST" Form in Place?  -            Discharge Medications   Allergies as of 11/13/2017   No Known Allergies     Medication List    STOP taking these medications   meclizine 25 MG tablet Commonly known as:  ANTIVERT     TAKE these medications   ACCU-CHEK SOFTCLIX LANCETS lancets Use as instructed twice daily   Alpha-Lipoic Acid 600 MG Caps Take 600 mg by mouth daily.   aspirin EC 81 MG tablet Take 81 mg by mouth daily.   calcium-vitamin D 500-200 MG-UNIT tablet Commonly known as:   OSCAL WITH D Take 1 tablet by mouth daily.   clopidogrel 75 MG tablet Commonly known as:  PLAVIX Take 75 mg by mouth daily.   dextromethorphan 30 MG/5ML liquid Commonly known as:  DELSYM Take 5 mLs (30 mg total) by mouth 2 (two) times daily.   feeding supplement (GLUCERNA SHAKE) Liqd Take 237 mLs by mouth 2 (two) times daily between meals.   finasteride 5 MG tablet Commonly known as:  PROSCAR Take 5 mg by mouth daily.   gabapentin  300 MG capsule Commonly known as:  NEURONTIN Take 300 mg by mouth at bedtime.   galantamine 8 MG 24 hr capsule Commonly known as:  RAZADYNE ER Take 8 mg by mouth daily with breakfast.   insulin aspart 100 UNIT/ML FlexPen Commonly known as:  NOVOLOG Inject 5 Units into the skin 3 (three) times daily with meals.   LANTUS SOLOSTAR 100 UNIT/ML Solostar Pen Generic drug:  Insulin Glargine Inject 18 Units into the skin every morning.   levothyroxine 25 MCG tablet Commonly known as:  SYNTHROID, LEVOTHROID Take 1 tablet by mouth daily.   lisinopril 10 MG tablet Commonly known as:  PRINIVIL,ZESTRIL Take 10 mg by mouth daily.   metFORMIN 500 MG tablet Commonly known as:  GLUCOPHAGE Take 1,000 mg by mouth 2 (two) times daily.   polyethylene glycol packet Commonly known as:  MIRALAX / GLYCOLAX Take 17 g by mouth daily. What changed:    when to take this  reasons to take this   PROLIA 60 MG/ML Sosy injection Generic drug:  denosumab Inject 60 mg into the skin every 6 (six) months.   senna-docusate 8.6-50 MG tablet Commonly known as:  Senokot-S Take 1 tablet by mouth at bedtime as needed for mild constipation.   simvastatin 20 MG tablet Commonly known as:  ZOCOR Take 20 mg by mouth every evening.   tamsulosin 0.4 MG Caps capsule Commonly known as:  FLOMAX Take 0.4 mg by mouth 2 (two) times daily.   vitamin B-12 1000 MCG tablet Commonly known as:  CYANOCOBALAMIN Take 1,000 mcg daily by mouth.          Total Time in preparing  paper work, data evaluation and todays exam - 8 minutes  Dustin Flock M.D on 11/13/2017 at 1:48 PM [place]  [phone]

## 2017-11-13 NOTE — Progress Notes (Signed)
Pt A&Ox2 with confusion. Wife/care giver at bedside. Pt and wife given discharge information. Medications reviewed. TIA/Stroke information given. Pt and wife denied questions. Pt to be transported home via wife. IVs removed. Pt monitored until discharge.  Kyla Balzarine, LPN

## 2017-11-15 DIAGNOSIS — G5603 Carpal tunnel syndrome, bilateral upper limbs: Secondary | ICD-10-CM | POA: Diagnosis not present

## 2017-11-15 DIAGNOSIS — Z9181 History of falling: Secondary | ICD-10-CM | POA: Diagnosis not present

## 2017-11-15 DIAGNOSIS — I10 Essential (primary) hypertension: Secondary | ICD-10-CM | POA: Diagnosis not present

## 2017-11-15 DIAGNOSIS — Z8673 Personal history of transient ischemic attack (TIA), and cerebral infarction without residual deficits: Secondary | ICD-10-CM | POA: Diagnosis not present

## 2017-11-15 DIAGNOSIS — I6782 Cerebral ischemia: Secondary | ICD-10-CM | POA: Diagnosis not present

## 2017-11-15 DIAGNOSIS — G6289 Other specified polyneuropathies: Secondary | ICD-10-CM | POA: Diagnosis not present

## 2017-11-15 DIAGNOSIS — F0390 Unspecified dementia without behavioral disturbance: Secondary | ICD-10-CM | POA: Diagnosis not present

## 2017-11-15 DIAGNOSIS — G9389 Other specified disorders of brain: Secondary | ICD-10-CM | POA: Diagnosis not present

## 2017-11-15 DIAGNOSIS — E1165 Type 2 diabetes mellitus with hyperglycemia: Secondary | ICD-10-CM | POA: Diagnosis not present

## 2017-11-19 ENCOUNTER — Other Ambulatory Visit: Payer: Self-pay

## 2017-11-19 DIAGNOSIS — Z9181 History of falling: Secondary | ICD-10-CM | POA: Diagnosis not present

## 2017-11-19 DIAGNOSIS — I6782 Cerebral ischemia: Secondary | ICD-10-CM | POA: Diagnosis not present

## 2017-11-19 DIAGNOSIS — F0390 Unspecified dementia without behavioral disturbance: Secondary | ICD-10-CM | POA: Diagnosis not present

## 2017-11-19 DIAGNOSIS — G6289 Other specified polyneuropathies: Secondary | ICD-10-CM | POA: Diagnosis not present

## 2017-11-19 DIAGNOSIS — G9389 Other specified disorders of brain: Secondary | ICD-10-CM | POA: Diagnosis not present

## 2017-11-19 DIAGNOSIS — Z8673 Personal history of transient ischemic attack (TIA), and cerebral infarction without residual deficits: Secondary | ICD-10-CM | POA: Diagnosis not present

## 2017-11-19 DIAGNOSIS — I10 Essential (primary) hypertension: Secondary | ICD-10-CM | POA: Diagnosis not present

## 2017-11-19 DIAGNOSIS — E1165 Type 2 diabetes mellitus with hyperglycemia: Secondary | ICD-10-CM | POA: Diagnosis not present

## 2017-11-19 DIAGNOSIS — G5603 Carpal tunnel syndrome, bilateral upper limbs: Secondary | ICD-10-CM | POA: Diagnosis not present

## 2017-11-19 NOTE — Patient Outreach (Signed)
Brian Barry) Care Management  11/19/2017  KAYLIN SCHELLENBERG 1929/11/21 [id number]  EMMI: general discharge red alert Referral date: 11/19/17 Referral reason: sad/ hopeless/ anxious / empty: yes Insurance: [employer] Day # 4  Telephone call to patient regarding EMMI general discharge. Spoke with patients wife/ designated party release, Campbell Riches.  HIPAA verified for patient by wife. Explained reason for call.  Wife states, " I think he may have a little anxiety.  Wife states patient is on medication for anxiety. Wife states patient has appointment with neurologist in August 2019. She states patients has an appointment with his primary MD 02/03/18.  RNCM advised wife and /or patient to contact patients primary MD to schedule post Barry discharge follow up appointment.  RNCM explained importance of patient having earlier follow up after Barry discharge. Wife verbalized understanding. . Wife states patient has all of his medications and takes them as prescribed. States patient has transportation to his appointments.  W[facility] states [facility] providing patient physical and occupational therapy Wife denies patient having any new onset of symptoms. RNCM reviewed signs/ symptoms of stroke. Advised to call 911 for stroke for stroke like symptoms.  RNCM discussed and offered [facility] care management services. Wife declined.  Wife states she and patient living at [facility].  States the [facility] has nursing on staff.  RNCM advised patient to notify MD of any changes in condition prior to scheduled appointment. RNCM provided contact name and number: [phone] or main office number [phone] and 24 hour nurse advise line [phone].  RNCM verified patient aware of 911 services for urgent/ emergent needs.  PLAN: RNCM will close patient due to patient being assessed and having no further needs.  RNCM will send patient [place] [facility] care management brochure/ magnet/Know before you go  sheet RNCM will send closure notification to patients primary MD.   Quinn Plowman RN,BSN,CCM [facility] Telephonic  [phone]

## 2017-11-22 DIAGNOSIS — G6289 Other specified polyneuropathies: Secondary | ICD-10-CM | POA: Diagnosis not present

## 2017-11-22 DIAGNOSIS — G5603 Carpal tunnel syndrome, bilateral upper limbs: Secondary | ICD-10-CM | POA: Diagnosis not present

## 2017-11-22 DIAGNOSIS — G9389 Other specified disorders of brain: Secondary | ICD-10-CM | POA: Diagnosis not present

## 2017-11-22 DIAGNOSIS — I6782 Cerebral ischemia: Secondary | ICD-10-CM | POA: Diagnosis not present

## 2017-11-22 DIAGNOSIS — Z8781 Personal history of (healed) traumatic fracture: Secondary | ICD-10-CM | POA: Diagnosis not present

## 2017-11-22 DIAGNOSIS — I10 Essential (primary) hypertension: Secondary | ICD-10-CM | POA: Diagnosis not present

## 2017-11-22 DIAGNOSIS — Z9181 History of falling: Secondary | ICD-10-CM | POA: Diagnosis not present

## 2017-11-22 DIAGNOSIS — E1165 Type 2 diabetes mellitus with hyperglycemia: Secondary | ICD-10-CM | POA: Diagnosis not present

## 2017-11-22 DIAGNOSIS — Z8673 Personal history of transient ischemic attack (TIA), and cerebral infarction without residual deficits: Secondary | ICD-10-CM | POA: Diagnosis not present

## 2017-11-22 DIAGNOSIS — M810 Age-related osteoporosis without current pathological fracture: Secondary | ICD-10-CM | POA: Diagnosis not present

## 2017-11-22 DIAGNOSIS — E1142 Type 2 diabetes mellitus with diabetic polyneuropathy: Secondary | ICD-10-CM | POA: Diagnosis not present

## 2017-11-22 DIAGNOSIS — F0390 Unspecified dementia without behavioral disturbance: Secondary | ICD-10-CM | POA: Diagnosis not present

## 2017-11-25 DIAGNOSIS — F0390 Unspecified dementia without behavioral disturbance: Secondary | ICD-10-CM | POA: Diagnosis not present

## 2017-11-25 DIAGNOSIS — G9389 Other specified disorders of brain: Secondary | ICD-10-CM | POA: Diagnosis not present

## 2017-11-25 DIAGNOSIS — G6289 Other specified polyneuropathies: Secondary | ICD-10-CM | POA: Diagnosis not present

## 2017-11-25 DIAGNOSIS — Z8673 Personal history of transient ischemic attack (TIA), and cerebral infarction without residual deficits: Secondary | ICD-10-CM | POA: Diagnosis not present

## 2017-11-25 DIAGNOSIS — I10 Essential (primary) hypertension: Secondary | ICD-10-CM | POA: Diagnosis not present

## 2017-11-25 DIAGNOSIS — Z9181 History of falling: Secondary | ICD-10-CM | POA: Diagnosis not present

## 2017-11-25 DIAGNOSIS — E1165 Type 2 diabetes mellitus with hyperglycemia: Secondary | ICD-10-CM | POA: Diagnosis not present

## 2017-11-25 DIAGNOSIS — I6782 Cerebral ischemia: Secondary | ICD-10-CM | POA: Diagnosis not present

## 2017-11-25 DIAGNOSIS — G5603 Carpal tunnel syndrome, bilateral upper limbs: Secondary | ICD-10-CM | POA: Diagnosis not present

## 2017-12-27 DIAGNOSIS — R410 Disorientation, unspecified: Secondary | ICD-10-CM | POA: Diagnosis not present

## 2018-02-03 DIAGNOSIS — Z79899 Other long term (current) drug therapy: Secondary | ICD-10-CM | POA: Diagnosis not present

## 2018-02-03 DIAGNOSIS — E538 Deficiency of other specified B group vitamins: Secondary | ICD-10-CM | POA: Diagnosis not present

## 2018-02-03 DIAGNOSIS — E039 Hypothyroidism, unspecified: Secondary | ICD-10-CM | POA: Diagnosis not present

## 2018-02-03 DIAGNOSIS — E119 Type 2 diabetes mellitus without complications: Secondary | ICD-10-CM | POA: Diagnosis not present

## 2018-02-07 DIAGNOSIS — R9401 Abnormal electroencephalogram [EEG]: Secondary | ICD-10-CM | POA: Diagnosis not present

## 2018-02-07 DIAGNOSIS — R410 Disorientation, unspecified: Secondary | ICD-10-CM | POA: Diagnosis not present

## 2018-02-08 DIAGNOSIS — R410 Disorientation, unspecified: Secondary | ICD-10-CM | POA: Diagnosis not present

## 2018-02-08 DIAGNOSIS — R9401 Abnormal electroencephalogram [EEG]: Secondary | ICD-10-CM | POA: Diagnosis not present

## 2018-02-09 DIAGNOSIS — R9401 Abnormal electroencephalogram [EEG]: Secondary | ICD-10-CM | POA: Diagnosis not present

## 2018-02-09 DIAGNOSIS — R410 Disorientation, unspecified: Secondary | ICD-10-CM | POA: Diagnosis not present

## 2018-02-10 DIAGNOSIS — E7800 Pure hypercholesterolemia, unspecified: Secondary | ICD-10-CM | POA: Diagnosis not present

## 2018-02-10 DIAGNOSIS — I10 Essential (primary) hypertension: Secondary | ICD-10-CM | POA: Diagnosis not present

## 2018-02-10 DIAGNOSIS — E1165 Type 2 diabetes mellitus with hyperglycemia: Secondary | ICD-10-CM | POA: Diagnosis not present

## 2018-02-10 DIAGNOSIS — N401 Enlarged prostate with lower urinary tract symptoms: Secondary | ICD-10-CM | POA: Diagnosis not present

## 2018-02-10 DIAGNOSIS — E039 Hypothyroidism, unspecified: Secondary | ICD-10-CM | POA: Diagnosis not present

## 2018-02-10 DIAGNOSIS — Z79899 Other long term (current) drug therapy: Secondary | ICD-10-CM | POA: Diagnosis not present

## 2018-02-10 DIAGNOSIS — G309 Alzheimer's disease, unspecified: Secondary | ICD-10-CM | POA: Diagnosis not present

## 2018-02-10 DIAGNOSIS — I679 Cerebrovascular disease, unspecified: Secondary | ICD-10-CM | POA: Diagnosis not present

## 2018-02-10 DIAGNOSIS — E1142 Type 2 diabetes mellitus with diabetic polyneuropathy: Secondary | ICD-10-CM | POA: Diagnosis not present

## 2018-02-12 ENCOUNTER — Emergency Department: Payer: Medicare HMO

## 2018-02-12 ENCOUNTER — Other Ambulatory Visit: Payer: Self-pay

## 2018-02-12 ENCOUNTER — Emergency Department
Admission: EM | Admit: 2018-02-12 | Discharge: 2018-02-12 | Disposition: A | Discharge status: Home / Self Care | Payer: Medicare HMO | Attending: Emergency Medicine | Admitting: Emergency Medicine

## 2018-02-12 ENCOUNTER — Encounter: Payer: Self-pay | Admitting: Emergency Medicine

## 2018-02-12 DIAGNOSIS — E119 Type 2 diabetes mellitus without complications: Secondary | ICD-10-CM | POA: Diagnosis not present

## 2018-02-12 DIAGNOSIS — R4020 Unspecified coma: Secondary | ICD-10-CM | POA: Diagnosis not present

## 2018-02-12 DIAGNOSIS — I10 Essential (primary) hypertension: Secondary | ICD-10-CM | POA: Diagnosis not present

## 2018-02-12 DIAGNOSIS — F0390 Unspecified dementia without behavioral disturbance: Secondary | ICD-10-CM | POA: Insufficient documentation

## 2018-02-12 DIAGNOSIS — R4182 Altered mental status, unspecified: Secondary | ICD-10-CM | POA: Diagnosis not present

## 2018-02-12 DIAGNOSIS — E039 Hypothyroidism, unspecified: Secondary | ICD-10-CM | POA: Diagnosis not present

## 2018-02-12 LAB — URINALYSIS, COMPLETE (UACMP) WITH MICROSCOPIC
Bacteria, UA: NONE SEEN
Bilirubin Urine: NEGATIVE
GLUCOSE, UA: NEGATIVE mg/dL
HGB URINE DIPSTICK: NEGATIVE
Ketones, ur: NEGATIVE mg/dL
Leukocytes, UA: NEGATIVE
Nitrite: NEGATIVE
PH: 7 (ref 5.0–8.0)
Protein, ur: NEGATIVE mg/dL
SPECIFIC GRAVITY, URINE: 1.01 (ref 1.005–1.030)
Squamous Epithelial / LPF: NONE SEEN (ref 0–5)
WBC, UA: NONE SEEN WBC/hpf (ref 0–5)

## 2018-02-12 LAB — CBC WITH DIFFERENTIAL/PLATELET
Basophils Absolute: 0 10*3/uL (ref 0–0.1)
Basophils Relative: 1 %
EOS ABS: 0.2 10*3/uL (ref 0–0.7)
Eosinophils Relative: 3 %
HEMATOCRIT: 33.7 % — AB (ref 40.0–52.0)
HEMOGLOBIN: 12.2 g/dL — AB (ref 13.0–18.0)
LYMPHS ABS: 0.7 10*3/uL — AB (ref 1.0–3.6)
Lymphocytes Relative: 12 %
MCH: 34 pg (ref 26.0–34.0)
MCHC: 36.1 g/dL — ABNORMAL HIGH (ref 32.0–36.0)
MCV: 94.3 fL (ref 80.0–100.0)
MONO ABS: 0.6 10*3/uL (ref 0.2–1.0)
MONOS PCT: 9 %
NEUTROS PCT: 75 %
Neutro Abs: 4.9 10*3/uL (ref 1.4–6.5)
Platelets: 177 10*3/uL (ref 150–440)
RBC: 3.58 MIL/uL — ABNORMAL LOW (ref 4.40–5.90)
RDW: 13 % (ref 11.5–14.5)
WBC: 6.4 10*3/uL (ref 3.8–10.6)

## 2018-02-12 LAB — COMPREHENSIVE METABOLIC PANEL
ALK PHOS: 71 U/L (ref 38–126)
ALT: 14 U/L (ref 0–44)
AST: 23 U/L (ref 15–41)
Albumin: 4.1 g/dL (ref 3.5–5.0)
Anion gap: 12 (ref 5–15)
BILIRUBIN TOTAL: 0.9 mg/dL (ref 0.3–1.2)
BUN: 13 mg/dL (ref 8–23)
CALCIUM: 9 mg/dL (ref 8.9–10.3)
CO2: 27 mmol/L (ref 22–32)
Chloride: 95 mmol/L — ABNORMAL LOW (ref 98–111)
Creatinine, Ser: 0.72 mg/dL (ref 0.61–1.24)
GFR calc non Af Amer: >60 mL/min (ref >60)
Glucose, Bld: 102 mg/dL — ABNORMAL HIGH (ref 70–99)
POTASSIUM: 3.7 mmol/L (ref 3.5–5.1)
Sodium: 134 mmol/L — ABNORMAL LOW (ref 135–145)
TOTAL PROTEIN: 6.7 g/dL (ref 6.5–8.1)

## 2018-02-12 LAB — TROPONIN I

## 2018-02-12 NOTE — ED Triage Notes (Signed)
Patient coming from [facility] apt where he lives with wife. Patient has hx of dementia but family states that he he more altered than normal. Patient according to family is more confused and having trouble getting words out.

## 2018-02-12 NOTE — ED Provider Notes (Signed)
[facility] Emergency Department Provider Note       Time seen: ----------------------------------------- 7:19 AM on 02/12/2018 ----------------------------------------- Level V caveat: History/ROS limited by altered mental status  I have reviewed the triage vital signs and the nursing notes.  HISTORY   Chief Complaint Altered Mental Status    HPI Brian Barry is a 84 y.o. male with a history of arthritis, diabetes, hyperlipidemia, hypertension, TIA, CVA and dementia who presents to the ED for altered mental status.  Patient comes from [facility] where he lives with his wife.  Patient reports he has a history of dementia but family states he is more altered than normal.  According to family he is more confused and is having trouble getting words out.  He cannot give any further review of systems or report.  Past Medical History:  Diagnosis Date  . Arthritis   . BPH (benign prostatic hyperplasia)   . DM (diabetes mellitus) ([place])   . Hyperlipidemia   . Hypertension   . Hypothyroidism   . Osteoporosis   . Pelvic fracture ([place])   . TIA (transient ischemic attack) 1998, 2014    Patient Active Problem List   Diagnosis Date Noted  . CVA (cerebral vascular accident) ([place]) 11/12/2017  . TIA (transient ischemic attack) 07/16/2016  . Confusion   . Ascending aortic aneurysm ([place]) 05/19/2016  . Hyponatremia 05/19/2016  . Generalized weakness 05/19/2016  . Sepsis due to pneumonia ([place]) 05/18/2016  . Hip fracture ([place]) 09/10/2015    Past Surgical History:  Procedure Laterality Date  . bilateral hip replacement Bilateral 1998  . Left knee replacement Left 2007  . LUMBAR LAMINECTOMY  2010  . right hip revision Right 2008    Allergies Patient has no known allergies.  Social History Social History   Tobacco Use  . Smoking status: Never Smoker  . Smokeless tobacco: Never Used  Substance Use Topics  . Alcohol use: No    Alcohol/week: 0.0 standard  drinks  . Drug use: No   Review of Systems Unknown, reported increased confusion and altered mental status  All systems negative/normal/unremarkable except as stated in the HPI  ____________________________________________   PHYSICAL EXAM:  VITAL SIGNS: ED Triage Vitals [02/12/18 0705]  Enc Vitals Group     BP      Pulse Rate 60     Resp      Temp 97.9 F (36.6 C)     Temp Source Oral     SpO2 95 %     Weight      Height      Head Circumference      Peak Flow      Pain Score 0     Pain Loc      Pain Edu?      Excl. in [place]?    Constitutional: Alert. and in no distress. Eyes: Conjunctivae are normal.  ENT   Head: Normocephalic and atraumatic.   Nose: No congestion/rhinnorhea.   Mouth/Throat: Mucous membranes are moist.   Neck: No stridor. Cardiovascular: Normal rate, regular rhythm. No murmurs, rubs, or gallops. Respiratory: Normal respiratory effort without tachypnea nor retractions. Breath sounds are clear and equal bilaterally. No wheezes/rales/rhonchi. Gastrointestinal: Soft and nontender. Normal bowel sounds Musculoskeletal: Nontender with normal range of motion in extremities. No lower extremity tenderness nor edema. Neurologic:  No gross focal neurologic deficits are appreciated.  Patient appears nonverbal at this time Skin:  Skin is warm, dry and intact. No rash noted. Psychiatric: Mood and  affect are normal. ____________________________________________  EKG: Interpreted by me.  Sinus rhythm the rate is 61 bpm, prolonged PR interval, borderline wide QRS, PVC, leftward axis.  Normal QT  ____________________________________________  ED COURSE:  As part of my medical decision making, I reviewed the following data within the [place] History obtained from family if available, nursing notes, old chart and ekg, as well as notes from prior ED visits. Patient presented for altered mental status, we will assess with labs and imaging as  indicated at this time.   Procedures ____________________________________________   LABS (pertinent positives/negatives)  Labs Reviewed  CBC WITH DIFFERENTIAL/PLATELET - Abnormal; Notable for the following components:      Result Value   RBC 3.58 (*)    Hemoglobin 12.2 (*)    HCT 33.7 (*)    MCHC 36.1 (*)    Lymphs Abs 0.7 (*)    All other components within normal limits  COMPREHENSIVE METABOLIC PANEL - Abnormal; Notable for the following components:   Sodium 134 (*)    Chloride 95 (*)    Glucose, Bld 102 (*)    All other components within normal limits  URINALYSIS, COMPLETE (UACMP) WITH MICROSCOPIC - Abnormal; Notable for the following components:   Color, Urine STRAW (*)    APPearance CLEAR (*)    All other components within normal limits  TROPONIN I  CBG MONITORING, ED    RADIOLOGY  CT head Did not reveal any acute process ____________________________________________  DIFFERENTIAL DIAGNOSIS   CVA, TIA, seizure, dehydration, electrolyte abnormality, occult infection  FINAL ASSESSMENT AND PLAN  Altered mental status   Plan: The patient had presented for altered mental status. Patient's labs do not reveal any acute process. Patient's imaging was also negative.  I discussed with his neurologist he did not recommend any new MRI.  He recently completed a 72-hour EEG which showed some slowing but no other acute process.  He is likely maximally medically managed at this point.  He appears cleared for outpatient follow-up, family is understanding of the situation.   Laurence Aly, MD   Note: This note was generated in part or whole with voice recognition software. Voice recognition is usually quite accurate but there are transcription errors that can and very often do occur. I apologize for any typographical errors that were not detected and corrected.     Earleen Newport, MD 02/12/18 1038

## 2018-02-12 NOTE — ED Notes (Signed)
Pt dx of early dementia, per wife is able to do most ADLs. Pt oriented to name, DOB, states he is in a hospital, he is 84yr old and its 67.

## 2018-02-24 DIAGNOSIS — G309 Alzheimer's disease, unspecified: Secondary | ICD-10-CM | POA: Diagnosis not present

## 2018-02-24 DIAGNOSIS — F0150 Vascular dementia without behavioral disturbance: Secondary | ICD-10-CM | POA: Diagnosis not present

## 2018-02-24 DIAGNOSIS — M21371 Foot drop, right foot: Secondary | ICD-10-CM | POA: Diagnosis not present

## 2018-02-24 DIAGNOSIS — I6381 Other cerebral infarction due to occlusion or stenosis of small artery: Secondary | ICD-10-CM | POA: Diagnosis not present

## 2018-02-24 DIAGNOSIS — E538 Deficiency of other specified B group vitamins: Secondary | ICD-10-CM | POA: Diagnosis not present

## 2018-02-24 DIAGNOSIS — G4733 Obstructive sleep apnea (adult) (pediatric): Secondary | ICD-10-CM | POA: Diagnosis not present

## 2018-02-24 DIAGNOSIS — F0280 Dementia in other diseases classified elsewhere without behavioral disturbance: Secondary | ICD-10-CM | POA: Diagnosis not present

## 2018-02-24 DIAGNOSIS — G608 Other hereditary and idiopathic neuropathies: Secondary | ICD-10-CM | POA: Diagnosis not present

## 2018-02-24 DIAGNOSIS — R410 Disorientation, unspecified: Secondary | ICD-10-CM | POA: Diagnosis not present

## 2018-02-28 DIAGNOSIS — E1165 Type 2 diabetes mellitus with hyperglycemia: Secondary | ICD-10-CM | POA: Diagnosis not present

## 2018-02-28 DIAGNOSIS — Z8781 Personal history of (healed) traumatic fracture: Secondary | ICD-10-CM | POA: Diagnosis not present

## 2018-02-28 DIAGNOSIS — M810 Age-related osteoporosis without current pathological fracture: Secondary | ICD-10-CM | POA: Diagnosis not present

## 2018-02-28 DIAGNOSIS — E1142 Type 2 diabetes mellitus with diabetic polyneuropathy: Secondary | ICD-10-CM | POA: Diagnosis not present

## 2018-03-19 DIAGNOSIS — M810 Age-related osteoporosis without current pathological fracture: Secondary | ICD-10-CM | POA: Diagnosis not present

## 2018-03-21 DIAGNOSIS — M810 Age-related osteoporosis without current pathological fracture: Secondary | ICD-10-CM | POA: Diagnosis not present

## 2018-05-25 ENCOUNTER — Inpatient Hospital Stay: Payer: Medicare HMO

## 2018-05-25 ENCOUNTER — Emergency Department: Payer: Medicare HMO

## 2018-05-25 ENCOUNTER — Other Ambulatory Visit: Payer: Self-pay

## 2018-05-25 ENCOUNTER — Inpatient Hospital Stay
Admission: EM | Admit: 2018-05-25 | Discharge: 2018-05-28 | DRG: 066 | Disposition: A | Discharge status: Skilled Nursing Facility | Payer: Medicare HMO | Attending: Internal Medicine | Admitting: Internal Medicine

## 2018-05-25 DIAGNOSIS — I6330 Cerebral infarction due to thrombosis of unspecified cerebral artery: Principal | ICD-10-CM | POA: Diagnosis present

## 2018-05-25 DIAGNOSIS — E785 Hyperlipidemia, unspecified: Secondary | ICD-10-CM | POA: Diagnosis present

## 2018-05-25 DIAGNOSIS — M1990 Unspecified osteoarthritis, unspecified site: Secondary | ICD-10-CM | POA: Diagnosis present

## 2018-05-25 DIAGNOSIS — Z7902 Long term (current) use of antithrombotics/antiplatelets: Secondary | ICD-10-CM | POA: Diagnosis not present

## 2018-05-25 DIAGNOSIS — I119 Hypertensive heart disease without heart failure: Secondary | ICD-10-CM | POA: Diagnosis present

## 2018-05-25 DIAGNOSIS — I639 Cerebral infarction, unspecified: Secondary | ICD-10-CM | POA: Diagnosis not present

## 2018-05-25 DIAGNOSIS — G459 Transient cerebral ischemic attack, unspecified: Secondary | ICD-10-CM | POA: Diagnosis not present

## 2018-05-25 DIAGNOSIS — Z79899 Other long term (current) drug therapy: Secondary | ICD-10-CM | POA: Diagnosis not present

## 2018-05-25 DIAGNOSIS — R29702 NIHSS score 2: Secondary | ICD-10-CM | POA: Diagnosis present

## 2018-05-25 DIAGNOSIS — N400 Enlarged prostate without lower urinary tract symptoms: Secondary | ICD-10-CM | POA: Diagnosis present

## 2018-05-25 DIAGNOSIS — E039 Hypothyroidism, unspecified: Secondary | ICD-10-CM | POA: Diagnosis present

## 2018-05-25 DIAGNOSIS — Z7982 Long term (current) use of aspirin: Secondary | ICD-10-CM | POA: Diagnosis not present

## 2018-05-25 DIAGNOSIS — Z8673 Personal history of transient ischemic attack (TIA), and cerebral infarction without residual deficits: Secondary | ICD-10-CM

## 2018-05-25 DIAGNOSIS — R262 Difficulty in walking, not elsewhere classified: Secondary | ICD-10-CM | POA: Diagnosis present

## 2018-05-25 DIAGNOSIS — R451 Restlessness and agitation: Secondary | ICD-10-CM | POA: Diagnosis present

## 2018-05-25 DIAGNOSIS — Z794 Long term (current) use of insulin: Secondary | ICD-10-CM

## 2018-05-25 DIAGNOSIS — I10 Essential (primary) hypertension: Secondary | ICD-10-CM | POA: Diagnosis present

## 2018-05-25 DIAGNOSIS — Z96643 Presence of artificial hip joint, bilateral: Secondary | ICD-10-CM | POA: Diagnosis present

## 2018-05-25 DIAGNOSIS — R531 Weakness: Secondary | ICD-10-CM

## 2018-05-25 DIAGNOSIS — Z96652 Presence of left artificial knee joint: Secondary | ICD-10-CM | POA: Diagnosis present

## 2018-05-25 DIAGNOSIS — Z79890 Hormone replacement therapy (postmenopausal): Secondary | ICD-10-CM | POA: Diagnosis not present

## 2018-05-25 DIAGNOSIS — M810 Age-related osteoporosis without current pathological fracture: Secondary | ICD-10-CM | POA: Diagnosis present

## 2018-05-25 DIAGNOSIS — E11649 Type 2 diabetes mellitus with hypoglycemia without coma: Secondary | ICD-10-CM | POA: Diagnosis present

## 2018-05-25 DIAGNOSIS — Z66 Do not resuscitate: Secondary | ICD-10-CM | POA: Diagnosis present

## 2018-05-25 LAB — URINALYSIS, COMPLETE (UACMP) WITH MICROSCOPIC
Bacteria, UA: NONE SEEN
Bilirubin Urine: NEGATIVE
Glucose, UA: NEGATIVE mg/dL
Hgb urine dipstick: NEGATIVE
Ketones, ur: NEGATIVE mg/dL
Leukocytes, UA: NEGATIVE
Nitrite: NEGATIVE
Protein, ur: NEGATIVE mg/dL
Specific Gravity, Urine: 1.009 (ref 1.005–1.030)
Squamous Epithelial / HPF: NONE SEEN (ref 0–5)
pH: 8 (ref 5.0–8.0)

## 2018-05-25 LAB — URINE DRUG SCREEN, QUALITATIVE (ARMC ONLY)
Amphetamines, Ur Screen: NOT DETECTED
Barbiturates, Ur Screen: NOT DETECTED
Benzodiazepine, Ur Scrn: NOT DETECTED
Cannabinoid 50 Ng, Ur ~~LOC~~: NOT DETECTED
Cocaine Metabolite,Ur ~~LOC~~: NOT DETECTED
MDMA (Ecstasy)Ur Screen: NOT DETECTED
Methadone Scn, Ur: NOT DETECTED
Opiate, Ur Screen: NOT DETECTED
Phencyclidine (PCP) Ur S: NOT DETECTED
Tricyclic, Ur Screen: NOT DETECTED

## 2018-05-25 LAB — COMPREHENSIVE METABOLIC PANEL
ALBUMIN: 3.9 g/dL (ref 3.5–5.0)
ALT: 13 U/L (ref 0–44)
ANION GAP: 9 (ref 5–15)
AST: 24 U/L (ref 15–41)
Alkaline Phosphatase: 46 U/L (ref 38–126)
BUN: 12 mg/dL (ref 8–23)
CHLORIDE: 97 mmol/L — AB (ref 98–111)
CO2: 26 mmol/L (ref 22–32)
Calcium: 8.7 mg/dL — ABNORMAL LOW (ref 8.9–10.3)
Creatinine, Ser: 0.72 mg/dL (ref 0.61–1.24)
GFR calc Af Amer: >60 mL/min (ref >60)
GFR calc non Af Amer: >60 mL/min (ref >60)
GLUCOSE: 88 mg/dL (ref 70–99)
POTASSIUM: 4 mmol/L (ref 3.5–5.1)
SODIUM: 132 mmol/L — AB (ref 135–145)
Total Bilirubin: 0.8 mg/dL (ref 0.3–1.2)
Total Protein: 6.3 g/dL — ABNORMAL LOW (ref 6.5–8.1)

## 2018-05-25 LAB — CBC WITH DIFFERENTIAL/PLATELET
Abs Immature Granulocytes: 0.02 10*3/uL (ref 0.00–0.07)
Basophils Absolute: 0 10*3/uL (ref 0.0–0.1)
Basophils Relative: 1 %
Eosinophils Absolute: 0.2 10*3/uL (ref 0.0–0.5)
Eosinophils Relative: 4 %
HCT: 35.5 % — ABNORMAL LOW (ref 39.0–52.0)
Hemoglobin: 12.1 g/dL — ABNORMAL LOW (ref 13.0–17.0)
Immature Granulocytes: 0 %
Lymphocytes Relative: 16 %
Lymphs Abs: 0.8 10*3/uL (ref 0.7–4.0)
MCH: 32.2 pg (ref 26.0–34.0)
MCHC: 34.1 g/dL (ref 30.0–36.0)
MCV: 94.4 fL (ref 80.0–100.0)
Monocytes Absolute: 0.6 10*3/uL (ref 0.1–1.0)
Monocytes Relative: 12 %
Neutro Abs: 3.3 10*3/uL (ref 1.7–7.7)
Neutrophils Relative %: 67 %
Platelets: 177 10*3/uL (ref 150–400)
RBC: 3.76 MIL/uL — ABNORMAL LOW (ref 4.22–5.81)
RDW: 12.3 % (ref 11.5–15.5)
WBC: 4.9 10*3/uL (ref 4.0–10.5)
nRBC: 0 % (ref 0.0–0.2)

## 2018-05-25 LAB — MAGNESIUM: Magnesium: 2 mg/dL (ref 1.7–2.4)

## 2018-05-25 LAB — PROTIME-INR
INR: 0.99
Prothrombin Time: 13 s (ref 11.4–15.2)

## 2018-05-25 LAB — GLUCOSE, CAPILLARY
Glucose-Capillary: 261 mg/dL — ABNORMAL HIGH (ref 70–99)
Glucose-Capillary: 172 mg/dL — ABNORMAL HIGH (ref 70–99)

## 2018-05-25 LAB — TROPONIN I: Troponin I: <0.03 ng/mL

## 2018-05-25 MED ORDER — SIMVASTATIN 20 MG PO TABS
20.0000 mg | ORAL_TABLET | Freq: Every evening | ORAL | Status: DC
  Administered 2018-05-25 – 2018-05-27 (×3): 20 mg via ORAL
  Filled 2018-05-25 (×3): qty 1

## 2018-05-25 MED ORDER — GALANTAMINE HYDROBROMIDE ER 8 MG PO CP24
8.0000 mg | ORAL_CAPSULE | Freq: Every day | ORAL | Status: DC
  Administered 2018-05-26 – 2018-05-28 (×3): 8 mg via ORAL
  Filled 2018-05-25 (×3): qty 1

## 2018-05-25 MED ORDER — GABAPENTIN 300 MG PO CAPS
300.0000 mg | ORAL_CAPSULE | Freq: Every day | ORAL | Status: DC
  Administered 2018-05-25 – 2018-05-27 (×3): 300 mg via ORAL
  Filled 2018-05-25 (×3): qty 1

## 2018-05-25 MED ORDER — GLIPIZIDE 5 MG PO TABS
5.0000 mg | ORAL_TABLET | Freq: Every day | ORAL | Status: DC
  Filled 2018-05-25: qty 1

## 2018-05-25 MED ORDER — TAMSULOSIN HCL 0.4 MG PO CAPS
0.4000 mg | ORAL_CAPSULE | Freq: Two times a day (BID) | ORAL | Status: DC
  Administered 2018-05-25 – 2018-05-28 (×6): 0.4 mg via ORAL
  Filled 2018-05-25 (×6): qty 1

## 2018-05-25 MED ORDER — LEVOTHYROXINE SODIUM 25 MCG PO TABS
25.0000 ug | ORAL_TABLET | Freq: Every day | ORAL | Status: DC
  Administered 2018-05-25 – 2018-05-27 (×3): 25 ug via ORAL
  Filled 2018-05-25 (×3): qty 1

## 2018-05-25 MED ORDER — ONDANSETRON HCL 4 MG/2ML IJ SOLN: 4.0000 mg | Freq: Four times a day (QID) | INTRAMUSCULAR | Status: DC | PRN

## 2018-05-25 MED ORDER — VITAMIN B-12 1000 MCG PO TABS
1000.0000 ug | ORAL_TABLET | Freq: Every day | ORAL | Status: DC
  Administered 2018-05-25 – 2018-05-28 (×4): 1000 ug via ORAL
  Filled 2018-05-25 (×4): qty 1

## 2018-05-25 MED ORDER — POLYETHYLENE GLYCOL 3350 17 G PO PACK: 17.0000 g | PACK | Freq: Every day | ORAL | Status: DC | PRN

## 2018-05-25 MED ORDER — INSULIN ASPART 100 UNIT/ML ~~LOC~~ SOLN
0.0000 [IU] | Freq: Every day | SUBCUTANEOUS | Status: DC
  Administered 2018-05-27: 21:00:00 2 [IU] via SUBCUTANEOUS
  Filled 2018-05-25: qty 1

## 2018-05-25 MED ORDER — ASPIRIN 81 MG PO CHEW
81.0000 mg | CHEWABLE_TABLET | Freq: Every day | ORAL | Status: DC
  Administered 2018-05-25 – 2018-05-28 (×4): 81 mg via ORAL
  Filled 2018-05-25 (×4): qty 1

## 2018-05-25 MED ORDER — ONDANSETRON HCL 4 MG PO TABS: 4.0000 mg | ORAL_TABLET | Freq: Four times a day (QID) | ORAL | Status: DC | PRN

## 2018-05-25 MED ORDER — FINASTERIDE 5 MG PO TABS
5.0000 mg | ORAL_TABLET | Freq: Every day | ORAL | Status: DC
  Administered 2018-05-25 – 2018-05-28 (×4): 5 mg via ORAL
  Filled 2018-05-25 (×4): qty 1

## 2018-05-25 MED ORDER — SODIUM CHLORIDE 0.9 % IV BOLUS
500.0000 mL | Freq: Once | INTRAVENOUS | Status: AC
  Administered 2018-05-25: 500 mL via INTRAVENOUS

## 2018-05-25 MED ORDER — ACETAMINOPHEN 650 MG RE SUPP: 650.0000 mg | Freq: Four times a day (QID) | RECTAL | Status: DC | PRN

## 2018-05-25 MED ORDER — INSULIN ASPART 100 UNIT/ML ~~LOC~~ SOLN
0.0000 [IU] | Freq: Three times a day (TID) | SUBCUTANEOUS | Status: DC
  Administered 2018-05-25: 5 [IU] via SUBCUTANEOUS
  Administered 2018-05-26: 17:00:00 1 [IU] via SUBCUTANEOUS
  Administered 2018-05-27: 13:00:00 2 [IU] via SUBCUTANEOUS
  Administered 2018-05-27 – 2018-05-28 (×2): 1 [IU] via SUBCUTANEOUS
  Filled 2018-05-25 (×5): qty 1

## 2018-05-25 MED ORDER — SENNOSIDES-DOCUSATE SODIUM 8.6-50 MG PO TABS: 1.0000 | ORAL_TABLET | Freq: Every evening | ORAL | Status: DC | PRN

## 2018-05-25 MED ORDER — CLOPIDOGREL BISULFATE 75 MG PO TABS
75.0000 mg | ORAL_TABLET | Freq: Every day | ORAL | Status: DC
  Administered 2018-05-25 – 2018-05-28 (×4): 75 mg via ORAL
  Filled 2018-05-25 (×4): qty 1

## 2018-05-25 MED ORDER — ENOXAPARIN SODIUM 40 MG/0.4ML ~~LOC~~ SOLN
40.0000 mg | SUBCUTANEOUS | Status: DC
  Administered 2018-05-25 – 2018-05-27 (×3): 40 mg via SUBCUTANEOUS
  Filled 2018-05-25 (×3): qty 0.4

## 2018-05-25 MED ORDER — INSULIN GLARGINE 100 UNIT/ML ~~LOC~~ SOLN
20.0000 [IU] | Freq: Every day | SUBCUTANEOUS | Status: DC
  Administered 2018-05-25: 20 [IU] via SUBCUTANEOUS
  Filled 2018-05-25 (×2): qty 0.2

## 2018-05-25 MED ORDER — LISINOPRIL 10 MG PO TABS
10.0000 mg | ORAL_TABLET | Freq: Every day | ORAL | Status: DC
  Administered 2018-05-25 – 2018-05-28 (×4): 10 mg via ORAL
  Filled 2018-05-25 (×4): qty 1

## 2018-05-25 MED ORDER — ACETAMINOPHEN 325 MG PO TABS: 650.0000 mg | ORAL_TABLET | Freq: Four times a day (QID) | ORAL | Status: DC | PRN

## 2018-05-25 MED ORDER — METFORMIN HCL 500 MG PO TABS
1000.0000 mg | ORAL_TABLET | Freq: Two times a day (BID) | ORAL | Status: DC
  Administered 2018-05-25 – 2018-05-28 (×6): 1000 mg via ORAL
  Filled 2018-05-25 (×7): qty 2

## 2018-05-25 MED ORDER — CALCIUM CARBONATE-VITAMIN D 500-200 MG-UNIT PO TABS
1.0000 | ORAL_TABLET | Freq: Two times a day (BID) | ORAL | Status: DC
  Administered 2018-05-25 – 2018-05-28 (×6): 1 via ORAL
  Filled 2018-05-25 (×6): qty 1

## 2018-05-25 NOTE — H&P (Signed)
[place] [facility] [place] NAME: Brian Barry    MR#:  [id number]  DATE OF BIRTH:  04-15-1930  DATE OF ADMISSION:  05/25/2018  PRIMARY CARE PHYSICIAN: Derinda Late, MD   REQUESTING/REFERRING PHYSICIAN: Dr. Charlotte Crumb  CHIEF COMPLAINT:   Chief Complaint  Patient presents with  . Weakness    HISTORY OF PRESENT ILLNESS:  Brian Barry  is a 85 y.o. male with a known history of prior history of strokes, hypertension, hyperlipidemia, arthritis, diabetes, BPH who is from [facility] independent facility presents to hospital secondary to worsening dizziness and weakness. Patient states he woke up yesterday with significant vertigo and dizziness.  Symptoms did not improve all through the day.  This morning when he woke up he could not get out of the bed and was noted to be weaker on his right side according to wife.  Patient however complains of generalized weakness and also diplopia that started this morning.  CT of the head showing multiple old infarction no acute findings.  He is getting admitted for further stroke work-up.  PAST MEDICAL HISTORY:   Past Medical History:  Diagnosis Date  . Arthritis   . BPH (benign prostatic hyperplasia)   . DM (diabetes mellitus) ([place])   . Hyperlipidemia   . Hypertension   . Hypothyroidism   . Osteoporosis   . Pelvic fracture ([place])   . TIA (transient ischemic attack) 1998, 2014    PAST SURGICAL HISTORY:   Past Surgical History:  Procedure Laterality Date  . bilateral hip replacement Bilateral 1998  . Left knee replacement Left 2007  . LUMBAR LAMINECTOMY  2010  . right hip revision Right 2008    SOCIAL HISTORY:   Social History   Tobacco Use  . Smoking status: Never Smoker  . Smokeless tobacco: Never Used  Substance Use Topics  . Alcohol use: No    Alcohol/week: 0.0 standard drinks    FAMILY HISTORY:   Family History  Problem Relation Age of Onset  . CVA Father 33  . Prostate  cancer Neg Hx   . Kidney cancer Neg Hx   . Bladder Cancer Neg Hx     DRUG ALLERGIES:  No Known Allergies  REVIEW OF SYSTEMS:   Review of Systems  Constitutional: Positive for malaise/fatigue. Negative for chills, fever and weight loss.  HENT: Negative for ear discharge, ear pain, hearing loss and nosebleeds.   Eyes: Positive for double vision. Negative for blurred vision and photophobia.  Respiratory: Negative for cough, hemoptysis, shortness of breath and wheezing.   Cardiovascular: Negative for chest pain, palpitations, orthopnea and leg swelling.  Gastrointestinal: Negative for abdominal pain, constipation, diarrhea, heartburn, melena, nausea and vomiting.  Genitourinary: Negative for dysuria, frequency, hematuria and urgency.  Musculoskeletal: Negative for back pain, myalgias and neck pain.  Skin: Negative for rash.  Neurological: Positive for dizziness and weakness. Negative for tingling, tremors, sensory change, speech change, focal weakness and headaches.  Endo/Heme/Allergies: Does not bruise/bleed easily.  Psychiatric/Behavioral: Negative for depression.    MEDICATIONS AT HOME:   Prior to Admission medications   Medication Sig Start Date End Date Taking? Authorizing Provider  Calcium Carbonate-Vitamin D 600-400 MG-UNIT tablet Take 1 tablet by mouth 2 (two) times daily.   Yes [provider]  clopidogrel (PLAVIX) 75 MG tablet Take 75 mg by mouth daily. 07/11/15  Yes [provider]  finasteride (PROSCAR) 5 MG tablet Take 5 mg by mouth daily.   Yes [provider]  gabapentin (NEURONTIN) 300 MG capsule Take 300 mg by mouth at bedtime.   Yes [provider]  galantamine (RAZADYNE ER) 8 MG 24 hr capsule Take 8 mg by mouth daily with breakfast.   Yes [provider]  glipiZIDE (GLUCOTROL) 5 MG tablet Take 5 mg by mouth daily before breakfast.   Yes [provider]  LANTUS SOLOSTAR 100 UNIT/ML Solostar Pen Inject 20 Units into  the skin daily.    Yes [provider]  levothyroxine (SYNTHROID, LEVOTHROID) 25 MCG tablet Take 25 mcg by mouth at bedtime.    Yes [provider]  lisinopril (PRINIVIL,ZESTRIL) 10 MG tablet Take 10 mg by mouth daily.  07/11/15  Yes [provider]  metFORMIN (GLUCOPHAGE) 500 MG tablet Take 1,000 mg by mouth 2 (two) times daily.  08/12/15  Yes [provider]  polyethylene glycol (MIRALAX / GLYCOLAX) packet Take 17 g by mouth daily. Patient taking differently: Take 17 g by mouth daily as needed for moderate constipation.  09/12/15  Yes Wieting, Richard, MD  PROLIA 60 MG/ML SOSY injection Inject 60 mg into the skin every 6 (six) months. 09/13/17  Yes [provider]  senna-docusate (SENOKOT-S) 8.6-50 MG tablet Take 1 tablet by mouth at bedtime as needed for mild constipation. 09/12/15  Yes Wieting, Richard, MD  simvastatin (ZOCOR) 20 MG tablet Take 20 mg by mouth every evening.  07/11/15  Yes [provider]  tamsulosin (FLOMAX) 0.4 MG CAPS capsule Take 0.4 mg by mouth 2 (two) times daily. 08/08/15  Yes [provider]  vitamin B-12 (CYANOCOBALAMIN) 1000 MCG tablet Take 1,000 mcg daily by mouth.   Yes [provider]  insulin aspart (NOVOLOG) 100 UNIT/ML FlexPen Inject 5 Units into the skin 3 (three) times daily with meals. Patient not taking: Reported on 07/12/2016 05/19/16   Theodoro Grist, MD      VITAL SIGNS:  Blood pressure 114/84, pulse (!) 55, temperature 97.6 F (36.4 C), temperature source Oral, resp. rate 11, height 5\' 8"  (1.727 m), weight 72.6 kg, SpO2 97 %.  PHYSICAL EXAMINATION:   Physical Exam  GENERAL:  85 y.o.-year-old patient lying in the bed with no acute distress.  EYES: Pupils equal, round, reactive to light and accommodation. No scleral icterus. Extraocular muscles intact.  HEENT: Head atraumatic, normocephalic. Oropharynx and nasopharynx clear.  NECK:  Supple, no jugular venous distention. No thyroid  enlargement, no tenderness.  LUNGS: Normal breath sounds bilaterally, no wheezing, rales,rhonchi or crepitation. No use of accessory muscles of respiration. Decreased bibasilar breath sounds CARDIOVASCULAR: S1, S2 normal. No rubs, or gallops. 3/6 systolic murmur present. ABDOMEN: Soft, nontender, nondistended. Bowel sounds present. No organomegaly or mass.  EXTREMITIES: No pedal edema, cyanosis, or clubbing.  NEUROLOGIC: Cranial nerves II through XII are intact except left eye adduction is weaker.  Muscle strength 5/5 in all extremities. 2+ deep tendon reflexes in both upper and lower extremities.  Sensation intact. Gait not checked.  PSYCHIATRIC: The patient is alert and oriented x 3.  SKIN: No obvious rash, lesion, or ulcer.   LABORATORY PANEL:   CBC Recent Labs  Lab 05/25/18 0913  WBC 4.9  HGB 12.1*  HCT 35.5*  PLT 177   ------------------------------------------------------------------------------------------------------------------  Chemistries  Recent Labs  Lab 05/25/18 0913  NA 132*  K 4.0  CL 97*  CO2 26  GLUCOSE 88  BUN 12  CREATININE 0.72  CALCIUM 8.7*  MG 2.0  AST 24  ALT 13  ALKPHOS 46  BILITOT 0.8   ------------------------------------------------------------------------------------------------------------------  Cardiac Enzymes Recent Labs  Lab 05/25/18 0913  TROPONINI <0.03   ------------------------------------------------------------------------------------------------------------------  RADIOLOGY:  Ct Head Wo Contrast  Result Date: 05/25/2018 CLINICAL DATA:  Patient with dizziness. EXAM: CT HEAD WITHOUT CONTRAST TECHNIQUE: Contiguous axial images were obtained from the base of the skull through the vertex without intravenous contrast. COMPARISON:  Brain CT 02/12/2018 FINDINGS: Brain: Ventricles and sulci are prominent compatible with atrophy. Chronic bilateral basal ganglia lacunar infarcts. Bilateral periventricular and subcortical white matter  hypodensities most compatible with chronic microvascular ischemic changes. No evidence for acute cortically based infarct, intracranial hemorrhage, mass lesion or mass-effect. Chronic left midbrain infarct. Old right frontal lobe infarct. Old right thalamic infarct. Vascular: Unremarkable Skull: Intact. Sinuses/Orbits: Paranasal sinuses are well aerated. Mastoid air cells are unremarkable. Orbits unremarkable. Other: None. IMPRESSION: No acute intracranial process. Atrophy and chronic microvascular ischemic changes. Electronically Signed   By: Lovey Newcomer M.D.   On: 05/25/2018 10:09   Dg Chest Port 1 View  Result Date: 05/25/2018 CLINICAL DATA:  Patient with generalized weakness. EXAM: PORTABLE CHEST 1 VIEW COMPARISON:  Chest radiograph 03/29/2017 FINDINGS: Monitoring leads overlie the patient. Stable cardiomegaly. Tortuosity of the thoracic aorta. No consolidative pulmonary opacities. No pleural effusion or pneumothorax. IMPRESSION: No acute cardiopulmonary process. Electronically Signed   By: Lovey Newcomer M.D.   On: 05/25/2018 10:11    EKG:   Orders placed or performed during the hospital encounter of 02/12/18  . EKG 12-Lead  . EKG 12-Lead  . EKG    IMPRESSION AND PLAN:   Tailor Westfall  is a 85 y.o. male with a known history of prior history of strokes, hypertension, hyperlipidemia, arthritis, diabetes, BPH who is from [facility] independent facility presents to hospital secondary to worsening dizziness and weakness.  1.  TIA-presenting with right-sided weakness which resolved, also still has some diplopia -Admit, neurology consult.  MRI of the brain, carotid Dopplers and echocardiogram ordered -Already on  Plavix and statin which will continue, add aspirin for now -PT/OT and speech consults  2.  Diabetes mellitus-continue Lantus, glipizide and metformin.  Add sliding scale insulin.  3.  BPH-on Flomax and finasteride  4.  Hypertension-on lisinopril  5.  DVT prophylaxis-Lovenox   All  the records are reviewed and case discussed with ED provider. Management plans discussed with the patient, family and they are in agreement.  CODE STATUS: DNR, has a signed DNR in the chart  [place] THIS PATIENT: 51 minutes.    Gladstone Lighter M.D on 05/25/2018 at 2:04 PM  Between 7am to 6pm - Pager - [phone]  After 6pm go to [email or web address] - password [email or web address] [facility] [facility]  Office  [phone]  CC: Primary care physician; Derinda Late, MD

## 2018-05-25 NOTE — ED Provider Notes (Addendum)
[facility] Emergency Department Provider Note  ____________________________________________   I have reviewed the triage vital signs and the nursing notes. Where available I have reviewed prior notes and, if possible and indicated, outside hospital notes.    HISTORY  Chief Complaint Weakness    HPI Brian Barry is a 85 y.o. male with a history of TIAs in the past diabetes mellitus hyperlipidemia hypertension, pneumonia in the past hyponatremia in the past, presents with generalized weakness which began yesterday when he woke up around 73.  He has not had any focal complaints.  In fact his review of systems is negative.  History as per EMS and per patient.  He states that he just feels generally weak.  He does not have any fever chills nausea vomiting diarrhea he does not feel confused over his baseline, he does not have any chest pain shortness of breath abdominal pain or fever, he denies any focal numbness or weakness he denies any headache he denies any stiff neck he denies any fall, he denies any difficulty talking or understanding speech.  He does states he feels generally weak.    Past Medical History:  Diagnosis Date  . Arthritis   . BPH (benign prostatic hyperplasia)   . DM (diabetes mellitus) ([place])   . Hyperlipidemia   . Hypertension   . Hypothyroidism   . Osteoporosis   . Pelvic fracture ([place])   . TIA (transient ischemic attack) 1998, 2014    Patient Active Problem List   Diagnosis Date Noted  . CVA (cerebral vascular accident) ([place]) 11/12/2017  . TIA (transient ischemic attack) 07/16/2016  . Confusion   . Ascending aortic aneurysm ([place]) 05/19/2016  . Hyponatremia 05/19/2016  . Generalized weakness 05/19/2016  . Sepsis due to pneumonia ([place]) 05/18/2016  . Hip fracture ([place]) 09/10/2015    Past Surgical History:  Procedure Laterality Date  . bilateral hip replacement Bilateral 1998  . Left knee replacement Left 2007  . LUMBAR  LAMINECTOMY  2010  . right hip revision Right 2008    Prior to Admission medications   Medication Sig Start Date End Date Taking? Authorizing Provider  Calcium Carbonate-Vitamin D 600-400 MG-UNIT tablet Take 1 tablet by mouth 2 (two) times daily.    [provider]  clopidogrel (PLAVIX) 75 MG tablet Take 75 mg by mouth daily. 07/11/15   [provider]  finasteride (PROSCAR) 5 MG tablet Take 5 mg by mouth daily.    [provider]  gabapentin (NEURONTIN) 100 MG capsule Take 200 mg by mouth at bedtime.    [provider]  gabapentin (NEURONTIN) 300 MG capsule Take 300 mg by mouth at bedtime.  07/10/16 02/12/18  [provider]  galantamine (RAZADYNE ER) 8 MG 24 hr capsule Take 8 mg by mouth daily with breakfast.    [provider]  insulin aspart (NOVOLOG) 100 UNIT/ML FlexPen Inject 5 Units into the skin 3 (three) times daily with meals. Patient not taking: Reported on 07/12/2016 05/19/16   Theodoro Grist, MD  LANTUS SOLOSTAR 100 UNIT/ML Solostar Pen Inject 20 Units into the skin daily.     [provider]  levothyroxine (SYNTHROID, LEVOTHROID) 25 MCG tablet Take 25 mcg by mouth at bedtime.     [provider]  lisinopril (PRINIVIL,ZESTRIL) 10 MG tablet Take 10 mg by mouth daily.  07/11/15   [provider]  metFORMIN (GLUCOPHAGE) 500 MG tablet Take 1,000 mg by mouth 2 (two) times daily.  08/12/15   [provider]  polyethylene glycol (MIRALAX / GLYCOLAX) packet Take 17 g by mouth daily. Patient taking differently: Take 17 g by mouth daily as needed for moderate constipation.  09/12/15   Loletha Grayer, MD  PROLIA 60 MG/ML SOSY injection Inject 60 mg into the skin every 6 (six) months. 09/13/17   [provider]  senna-docusate (SENOKOT-S) 8.6-50 MG tablet Take 1 tablet by mouth at bedtime as needed for mild constipation. 09/12/15   Loletha Grayer, MD  simvastatin (ZOCOR) 20 MG tablet Take 20 mg by mouth  every evening.  07/11/15   [provider]  tamsulosin (FLOMAX) 0.4 MG CAPS capsule Take 0.4 mg by mouth 2 (two) times daily. 08/08/15   [provider]  vitamin B-12 (CYANOCOBALAMIN) 1000 MCG tablet Take 1,000 mcg daily by mouth.    [provider]    Allergies Patient has no known allergies.  Family History  Problem Relation Age of Onset  . CVA Father 10  . Prostate cancer Neg Hx   . Kidney cancer Neg Hx   . Bladder Cancer Neg Hx     Social History Social History   Tobacco Use  . Smoking status: Never Smoker  . Smokeless tobacco: Never Used  Substance Use Topics  . Alcohol use: No    Alcohol/week: 0.0 standard drinks  . Drug use: No    Review of Systems Constitutional: No fever/chills Eyes: No visual changes. ENT: No sore throat. No stiff neck no neck pain Cardiovascular: Denies chest pain. Respiratory: Denies shortness of breath. Gastrointestinal:   no vomiting.  No diarrhea.  No constipation. Genitourinary: Negative for dysuria. Musculoskeletal: Negative lower extremity swelling Skin: Negative for rash. Neurological: Negative for severe headaches, focal weakness or numbness.   ____________________________________________   PHYSICAL EXAM:  VITAL SIGNS: ED Triage Vitals  Enc Vitals Group     BP 05/25/18 0912 (!) 148/58     Pulse Rate 05/25/18 0910 (!) 54     Resp 05/25/18 0910 14     Temp 05/25/18 0912 97.6 F (36.4 C)     Temp Source 05/25/18 0912 Oral     SpO2 05/25/18 0910 93 %     Weight 05/25/18 0908 160 lb (72.6 kg)     Height 05/25/18 0908 5\' 8"  (1.727 m)     Head Circumference --      Peak Flow --      Pain Score 05/25/18 0908 0     Pain Loc --      Pain Edu? --      Excl. in [place]? --     Constitutional: Awake and oriented to name and place unsure of the exact date, knows that Sunday unsure of the year. Well appearing and in no acute distress. Eyes: Conjunctivae are normal Head: Atraumatic HEENT: No  congestion/rhinnorhea. Mucous membranes are moist.  Oropharynx non-erythematous Neck:   Nontender with no meningismus, no masses, no stridor Cardiovascular: Normal rate, regular rhythm. Grossly normal heart sounds.  Good peripheral circulation. Respiratory: Normal respiratory effort.  No retractions. Lungs CTAB. Abdominal: Soft and nontender. No distention. No guarding no rebound Back:  There is no focal tenderness or step off.  there is no midline tenderness there are no lesions noted. there is no CVA tenderness Musculoskeletal: No lower extremity tenderness, no upper extremity tenderness. No joint effusions, no DVT signs strong distal pulses no edema Neurologic:  Normal speech and language. No gross focal neurologic deficits are appreciated.  Skin:  Skin is warm, dry and intact.  No rash noted. Psychiatric: Mood and affect are normal. Speech and behavior are normal.  ____________________________________________   LABS (all labs ordered are listed, but only abnormal results are displayed)  Labs Reviewed - No data to display  Pertinent labs  results that were available during my care of the patient were reviewed by me and considered in my medical decision making (see chart for details). ____________________________________________  EKG  I personally interpreted any EKGs ordered by me or triage Sinus rhythm rate 58 bpm, bradycardia noted, normal axis no acute ST elevation depression or acute ischemic changes ____________________________________________  RADIOLOGY  Pertinent labs & imaging results that were available during my care of the patient were reviewed by me and considered in my medical decision making (see chart for details). If possible, patient and/or family made aware of any abnormal findings.  No results found. ____________________________________________    PROCEDURES  Procedure(s) performed: None  Procedures  Critical Care performed:  None  ____________________________________________   INITIAL IMPRESSION / ASSESSMENT AND PLAN / ED COURSE  Pertinent labs & imaging results that were available during my care of the patient were reviewed by me and considered in my medical decision making (see chart for details).  Patient is here from home for generalized weakness, undifferentiated essentially.  He is DNR.  His CODE STATUS is with him.  He does not seem to have any focality and his stroke scale is negative for me and for EMS.  Nonetheless given his weakness and possibly poor reporting of possible trauma we will obtain CT scan of his head.  He does have a history of hyponatremia we will check electrolytes we will look for infection and chest x-ray and urine and we will reassess.  ----------------------------------------- 12:10 PM on 05/25/2018 -----------------------------------------  Patient is more alert now, he was somewhat somnolent upon arrival.  Able now to better participate in the exam.  I did do extraocular muscles with patient better participating, he does seem to be unable to move the left eye medially, I do not detect any other deficits at this time.  Talk to Dr. Doy Mince about this.  She advised further work-up in the hospital.    ____________________________________________   FINAL CLINICAL IMPRESSION(S) / ED DIAGNOSES  Final diagnoses:  None      This chart was dictated using voice recognition software.  Despite best efforts to proofread,  errors can occur which can change meaning.      Schuyler Amor, MD 05/25/18 9179    Schuyler Amor, MD 05/25/18 [phone]

## 2018-05-25 NOTE — ED Notes (Signed)
Pt given PB and crackers and water.

## 2018-05-25 NOTE — ED Notes (Signed)
Pt unable to urinate at this time.  

## 2018-05-25 NOTE — ED Notes (Signed)
Patient transported to CT 

## 2018-05-25 NOTE — Consult Note (Signed)
Referring Physician: Burlene Arnt    Chief Complaint: Diplopia  HPI: Brian Barry is an 85 y.o. male with a history of TIA on Plavix who presents with complaints of dizziness and diplopia.  Patient reports going to bed Friday night at baseline.  Awakened on yesterday and felt vertiginous.  Wife felt like his left eye was deviated outward.  Started to complain of diplopia today and weakness.  Is off balance when walking.  Was brought in for evaluation.  Initial NIHSS of 2. Family also reports a 1-2 day episode of confusion around the beginning of the year.  Date last known well: Date: 05/23/2018 Time last known well: Time: 21:00 tPA Given: No: Outside time window  Past Medical History:  Diagnosis Date  . Arthritis   . BPH (benign prostatic hyperplasia)   . DM (diabetes mellitus) ([place])   . Hyperlipidemia   . Hypertension   . Hypothyroidism   . Osteoporosis   . Pelvic fracture ([place])   . TIA (transient ischemic attack) 1998, 2014    Past Surgical History:  Procedure Laterality Date  . bilateral hip replacement Bilateral 1998  . Left knee replacement Left 2007  . LUMBAR LAMINECTOMY  2010  . right hip revision Right 2008    Family History  Problem Relation Age of Onset  . CVA Father 92  . Prostate cancer Neg Hx   . Kidney cancer Neg Hx   . Bladder Cancer Neg Hx    Social History:  reports that he has never smoked. He has never used smokeless tobacco. He reports that he does not drink alcohol or use drugs.  Allergies: No Known Allergies  Medications: I have reviewed the patient's current medications. Prior to Admission:  Prior to Admission medications   Medication Sig Start Date End Date Taking? Authorizing Provider  Calcium Carbonate-Vitamin D 600-400 MG-UNIT tablet Take 1 tablet by mouth 2 (two) times daily.   Yes [provider]  clopidogrel (PLAVIX) 75 MG tablet Take 75 mg by mouth daily. 07/11/15  Yes [provider]  finasteride (PROSCAR) 5 MG tablet Take  5 mg by mouth daily.   Yes [provider]  gabapentin (NEURONTIN) 300 MG capsule Take 300 mg by mouth at bedtime.   Yes [provider]  galantamine (RAZADYNE ER) 8 MG 24 hr capsule Take 8 mg by mouth daily with breakfast.   Yes [provider]  glipiZIDE (GLUCOTROL) 5 MG tablet Take 5 mg by mouth daily before breakfast.   Yes [provider]  LANTUS SOLOSTAR 100 UNIT/ML Solostar Pen Inject 20 Units into the skin daily.    Yes [provider]  levothyroxine (SYNTHROID, LEVOTHROID) 25 MCG tablet Take 25 mcg by mouth at bedtime.    Yes [provider]  lisinopril (PRINIVIL,ZESTRIL) 10 MG tablet Take 10 mg by mouth daily.  07/11/15  Yes [provider]  metFORMIN (GLUCOPHAGE) 500 MG tablet Take 1,000 mg by mouth 2 (two) times daily.  08/12/15  Yes [provider]  polyethylene glycol (MIRALAX / GLYCOLAX) packet Take 17 g by mouth daily. Patient taking differently: Take 17 g by mouth daily as needed for moderate constipation.  09/12/15  Yes Wieting, Richard, MD  PROLIA 60 MG/ML SOSY injection Inject 60 mg into the skin every 6 (six) months. 09/13/17  Yes [provider]  senna-docusate (SENOKOT-S) 8.6-50 MG tablet Take 1 tablet by mouth at bedtime as needed for mild constipation. 09/12/15  Yes Loletha Grayer, MD  simvastatin (ZOCOR) 20 MG  tablet Take 20 mg by mouth every evening.  07/11/15  Yes [provider]  tamsulosin (FLOMAX) 0.4 MG CAPS capsule Take 0.4 mg by mouth 2 (two) times daily. 08/08/15  Yes [provider]  vitamin B-12 (CYANOCOBALAMIN) 1000 MCG tablet Take 1,000 mcg daily by mouth.   Yes [provider]  insulin aspart (NOVOLOG) 100 UNIT/ML FlexPen Inject 5 Units into the skin 3 (three) times daily with meals. Patient not taking: Reported on 07/12/2016 05/19/16   Theodoro Grist, MD    ROS: History obtained from the patient  General ROS: negative for - chills, fatigue, fever, night  sweats, weight gain or weight loss Psychological ROS: as noted in HPI ENT ROS: negative for - epistaxis, nasal discharge, oral lesions, sore throat, tinnitus or vertigo Allergy and Immunology ROS: negative for - hives or itchy/watery eyes Hematological and Lymphatic ROS: negative for - bleeding problems, bruising or swollen lymph nodes Endocrine ROS: negative for - galactorrhea, hair pattern changes, polydipsia/polyuria or temperature intolerance Respiratory ROS: cough Cardiovascular ROS: negative for - chest pain, dyspnea on exertion, edema or irregular heartbeat Gastrointestinal ROS: negative for - abdominal pain, diarrhea, hematemesis, nausea/vomiting or stool incontinence Genito-Urinary ROS: negative for - dysuria, hematuria, incontinence or urinary frequency/urgency Musculoskeletal ROS: right foot drop Neurological ROS: as noted in HPI Dermatological ROS: negative for rash and skin lesion changes  Physical Examination: Blood pressure 114/84, pulse (!) 55, temperature 97.6 F (36.4 C), temperature source Oral, resp. rate 11, height 5\' 8"  (1.727 m), weight 72.6 kg, SpO2 97 %.  HEENT-  Normocephalic, no lesions, without obvious abnormality.  Normal external eye and conjunctiva.  Normal TM's bilaterally.  Normal auditory canals and external ears. Normal external nose, mucus membranes and septum.  Normal pharynx. Cardiovascular- S1, S2 normal, pulses palpable throughout   Lungs- chest clear, no wheezing, rales, normal symmetric air entry Abdomen- soft, non-tender; bowel sounds normal; no masses,  no organomegaly Extremities- no edema Lymph-no adenopathy palpable Musculoskeletal-no joint tenderness, deformity or swelling Skin-warm and dry, no hyperpigmentation, vitiligo, or suspicious lesions  Neurological Examination   Mental Status: Alert, thought content appropriate.  Speech fluent without evidence of aphasia.  Able to follow 3 step commands without difficulty. Cranial Nerves: II:  Discs flat bilaterally; Visual fields grossly normal, pupils equal, round, reactive to light and accommodation III,IV, VI: mild left ptosis, Full EOMs in the right eye.  Unable to go beyond midline medially with the left eye.  Limitation on upward and downward gaze.   V,VII: smile symmetric, facial light touch sensation normal bilaterally VIII: hearing normal bilaterally IX,X: gag reflex present XI: bilateral shoulder shrug XII: midline tongue extension Motor: Right : Upper extremity   5/5 with foot drop    Left:     Upper extremity   5/5  Lower extremity   5/5       Lower extremity   5/5 Tone and bulk:normal tone throughout; no atrophy noted Sensory: Pinprick and light touch intact throughout, bilaterally Deep Tendon Reflexes: 2+ and symmetric with absent AJ's bilaterally Plantars: Right: mute   Left: upgoing Cerebellar: Normal finger-to-nose testing.  Mild dysmetria with heel-to-shin testing on the right Gait: not tested due to safety concerns    Laboratory Studies:  Basic Metabolic Panel: Recent Labs  Lab 05/25/18 0913  NA 132*  K 4.0  CL 97*  CO2 26  GLUCOSE 88  BUN 12  CREATININE 0.72  CALCIUM 8.7*  MG 2.0    Liver Function Tests: Recent Labs  Lab 05/25/18  0913  AST 24  ALT 13  ALKPHOS 46  BILITOT 0.8  PROT 6.3*  ALBUMIN 3.9   No results for input(s): LIPASE, AMYLASE in the last 168 hours. No results for input(s): AMMONIA in the last 168 hours.  CBC: Recent Labs  Lab 05/25/18 0913  WBC 4.9  NEUTROABS 3.3  HGB 12.1*  HCT 35.5*  MCV 94.4  PLT 177    Cardiac Enzymes: Recent Labs  Lab 05/25/18 0913  TROPONINI <0.03    BNP: Invalid input(s): POCBNP  CBG: No results for input(s): GLUCAP in the last 168 hours.  Microbiology: Results for orders placed or performed in visit on 07/12/16  Microscopic Examination     Status: None   Collection Time: 07/12/16  2:58 PM  Result Value Ref Range Status   WBC, UA None seen 0 - 5 /hpf Final   RBC, UA  0-2 0 - 2 /hpf Final   Epithelial Cells (non renal) None seen 0 - 10 /hpf Final   Bacteria, UA None seen None seen/Few Final    Coagulation Studies: Recent Labs    05/25/18 0913  LABPROT 13.0  INR 0.99    Urinalysis:  Recent Labs  Lab 05/25/18 0912  COLORURINE STRAW*  LABSPEC 1.009  PHURINE 8.0  GLUCOSEU NEGATIVE  HGBUR NEGATIVE  BILIRUBINUR NEGATIVE  KETONESUR NEGATIVE  PROTEINUR NEGATIVE  NITRITE NEGATIVE  LEUKOCYTESUR NEGATIVE    Lipid Panel:    Component Value Date/Time   CHOL 105 11/13/2017 0511   TRIG 119 11/13/2017 0511   HDL 32 (L) 11/13/2017 0511   CHOLHDL 3.3 11/13/2017 0511   VLDL 24 11/13/2017 0511   LDLCALC 49 11/13/2017 0511    HgbA1C:  Lab Results  Component Value Date   HGBA1C 7.8 (H) 11/13/2017    Urine Drug Screen:      Component Value Date/Time   LABOPIA NONE DETECTED 05/25/2018 0907   COCAINSCRNUR NONE DETECTED 05/25/2018 0907   LABBENZ NONE DETECTED 05/25/2018 0907   AMPHETMU NONE DETECTED 05/25/2018 0907   THCU NONE DETECTED 05/25/2018 0907   LABBARB NONE DETECTED 05/25/2018 0907    Alcohol Level: No results for input(s): ETH in the last 168 hours.   Imaging: Ct Head Wo Contrast  Result Date: 05/25/2018 CLINICAL DATA:  Patient with dizziness. EXAM: CT HEAD WITHOUT CONTRAST TECHNIQUE: Contiguous axial images were obtained from the base of the skull through the vertex without intravenous contrast. COMPARISON:  Brain CT 02/12/2018 FINDINGS: Brain: Ventricles and sulci are prominent compatible with atrophy. Chronic bilateral basal ganglia lacunar infarcts. Bilateral periventricular and subcortical white matter hypodensities most compatible with chronic microvascular ischemic changes. No evidence for acute cortically based infarct, intracranial hemorrhage, mass lesion or mass-effect. Chronic left midbrain infarct. Old right frontal lobe infarct. Old right thalamic infarct. Vascular: Unremarkable Skull: Intact. Sinuses/Orbits: Paranasal  sinuses are well aerated. Mastoid air cells are unremarkable. Orbits unremarkable. Other: None. IMPRESSION: No acute intracranial process. Atrophy and chronic microvascular ischemic changes. Electronically Signed   By: Lovey Newcomer M.D.   On: 05/25/2018 10:09   Dg Chest Port 1 View  Result Date: 05/25/2018 CLINICAL DATA:  Patient with generalized weakness. EXAM: PORTABLE CHEST 1 VIEW COMPARISON:  Chest radiograph 03/29/2017 FINDINGS: Monitoring leads overlie the patient. Stable cardiomegaly. Tortuosity of the thoracic aorta. No consolidative pulmonary opacities. No pleural effusion or pneumothorax. IMPRESSION: No acute cardiopulmonary process. Electronically Signed   By: Lovey Newcomer M.D.   On: 05/25/2018 10:11    Assessment: 85 y.o. male presenting with diplopia,  vertigo and difficulty with gait.  Neurological examination significant for third cranial nerve abnormalities and mild dysmetria. Concern is for brain stem infarct.  Patient on Plavix at home. Further work up recommended.    Stroke Risk Factors - diabetes mellitus, hyperlipidemia and hypertension  Plan: 1. HgbA1c, fasting lipid panel 2. MRI, MRA  of the brain without contrast 3. PT consult, OT consult, Speech consult 4. Echocardiogram 5. Carotid dopplers 6. Prophylactic therapy-Continue Plavix at 75mg  daily 7. NPO until RN stroke swallow screen 8. Telemetry monitoring 9. Frequent neuro checks  Case discussed with Dr. Gordy Councilman, MD Neurology [phone] 05/25/2018, 1:08 PM

## 2018-05-25 NOTE — ED Triage Notes (Signed)
Pt woke up yesterday morning with dizziness. This am pt had double vision and worsening lethargy. Hx of strokes and TIAs. CBG 99 with EMS. VSS with EMS. Stroke screen negative with EMS. Pt in 1st degree HB with EMS.

## 2018-05-25 NOTE — Plan of Care (Signed)
  Problem: Education: Goal: Knowledge of General Education information will improve Description Including pain rating scale, medication(s)/side effects and non-pharmacologic comfort measures Outcome: Progressing   Problem: Health Behavior/Discharge Planning: Goal: Ability to manage health-related needs will improve Outcome: Progressing   Problem: Clinical Measurements: Goal: Ability to maintain clinical measurements within normal limits will improve Outcome: Progressing Goal: Will remain free from infection Outcome: Progressing Goal: Diagnostic test results will improve Outcome: Progressing Goal: Respiratory complications will improve Outcome: Progressing Goal: Cardiovascular complication will be avoided Outcome: Progressing   Problem: Activity: Goal: Risk for activity intolerance will decrease Outcome: Progressing   Problem: Nutrition: Goal: Adequate nutrition will be maintained Outcome: Progressing   Problem: Coping: Goal: Level of anxiety will decrease Outcome: Progressing   Problem: Elimination: Goal: Will not experience complications related to bowel motility Outcome: Progressing Goal: Will not experience complications related to urinary retention Outcome: Progressing   Problem: Pain Managment: Goal: General experience of comfort will improve Outcome: Progressing   Problem: Safety: Goal: Ability to remain free from injury will improve Outcome: Progressing   Problem: Skin Integrity: Goal: Risk for impaired skin integrity will decrease Outcome: Progressing   Problem: Education: Goal: Knowledge of patient specific risk factors addressed and post discharge goals established will improve Outcome: Progressing Goal: Individualized Educational Video(s) Outcome: Progressing   Problem: Coping: Goal: Will identify appropriate support needs Outcome: Progressing

## 2018-05-25 NOTE — ED Notes (Signed)
Patient transported to Ultrasound 

## 2018-05-26 ENCOUNTER — Inpatient Hospital Stay (HOSPITAL_COMMUNITY)
Admit: 2018-05-26 | Discharge: 2018-05-26 | Disposition: A | Discharge status: Home / Self Care | Payer: Medicare HMO | Attending: Internal Medicine | Admitting: Internal Medicine

## 2018-05-26 DIAGNOSIS — G459 Transient cerebral ischemic attack, unspecified: Secondary | ICD-10-CM

## 2018-05-26 LAB — HEMOGLOBIN A1C
Hgb A1c MFr Bld: 6.8 % — ABNORMAL HIGH (ref 4.8–5.6)
MEAN PLASMA GLUCOSE: 148.46 mg/dL

## 2018-05-26 LAB — LIPID PANEL
Cholesterol: 105 mg/dL (ref 0–200)
HDL: 35 mg/dL — ABNORMAL LOW (ref >40)
LDL Cholesterol: 62 mg/dL (ref 0–99)
Total CHOL/HDL Ratio: 3 RATIO
Triglycerides: 40 mg/dL (ref <150)
VLDL: 8 mg/dL (ref 0–40)

## 2018-05-26 LAB — GLUCOSE, CAPILLARY
Glucose-Capillary: 113 mg/dL — ABNORMAL HIGH (ref 70–99)
Glucose-Capillary: 108 mg/dL — ABNORMAL HIGH (ref 70–99)
Glucose-Capillary: 138 mg/dL — ABNORMAL HIGH (ref 70–99)
Glucose-Capillary: 150 mg/dL — ABNORMAL HIGH (ref 70–99)

## 2018-05-26 LAB — BASIC METABOLIC PANEL
Anion gap: 6 (ref 5–15)
BUN: 13 mg/dL (ref 8–23)
CALCIUM: 9 mg/dL (ref 8.9–10.3)
CO2: 29 mmol/L (ref 22–32)
Chloride: 100 mmol/L (ref 98–111)
Creatinine, Ser: 0.65 mg/dL (ref 0.61–1.24)
GFR calc Af Amer: >60 mL/min (ref >60)
GFR calc non Af Amer: >60 mL/min (ref >60)
Glucose, Bld: 55 mg/dL — ABNORMAL LOW (ref 70–99)
Potassium: 3.7 mmol/L (ref 3.5–5.1)
Sodium: 135 mmol/L (ref 135–145)

## 2018-05-26 LAB — CBC
HCT: 32.6 % — ABNORMAL LOW (ref 39.0–52.0)
Hemoglobin: 11.1 g/dL — ABNORMAL LOW (ref 13.0–17.0)
MCH: 31.6 pg (ref 26.0–34.0)
MCHC: 34 g/dL (ref 30.0–36.0)
MCV: 92.9 fL (ref 80.0–100.0)
Platelets: 184 10*3/uL (ref 150–400)
RBC: 3.51 MIL/uL — ABNORMAL LOW (ref 4.22–5.81)
RDW: 12.3 % (ref 11.5–15.5)
WBC: 5.3 10*3/uL (ref 4.0–10.5)
nRBC: 0 % (ref 0.0–0.2)

## 2018-05-26 MED ORDER — QUETIAPINE FUMARATE 25 MG PO TABS
25.0000 mg | ORAL_TABLET | Freq: Every day | ORAL | Status: DC
  Administered 2018-05-26: 25 mg via ORAL
  Filled 2018-05-26: qty 1

## 2018-05-26 MED ORDER — INSULIN GLARGINE 100 UNIT/ML ~~LOC~~ SOLN
15.0000 [IU] | Freq: Every day | SUBCUTANEOUS | Status: DC
  Administered 2018-05-27: 21:00:00 15 [IU] via SUBCUTANEOUS
  Filled 2018-05-26 (×2): qty 0.15

## 2018-05-26 NOTE — Progress Notes (Addendum)
Subjective: No new strokes or strokelike symptoms reported.  Patient continues to have vision disturbances slightly improved from previous.  He continues to complains of blurry vision and diplopia.  Family currently at bedside  Objective: Current vital signs: BP (!) 137/57 (BP Location: Left Arm)   Pulse (!) 58   Temp 98.2 F (36.8 C) (Oral)   Resp 16   Ht 5\' 8"  (1.727 m)   Wt 74.9 kg   SpO2 97%   BMI 25.10 kg/m  Vital signs in last 24 hours: Temp:  [97.9 F (36.6 C)-98.8 F (37.1 C)] 98.2 F (36.8 C) (01/13 0800) Pulse Rate:  [51-66] 58 (01/13 0800) Resp:  [11-21] 16 (01/13 0800) BP: (114-182)/(52-98) 137/57 (01/13 0800) SpO2:  [94 %-98 %] 97 % (01/13 0800) Weight:  [74.9 kg] 74.9 kg (01/12 1455)  Intake/Output from previous day: No intake/output data recorded. Intake/Output this shift: No intake/output data recorded. Nutritional status:  Diet Order            Diet heart healthy/carb modified Room service appropriate? Yes; Fluid consistency: Thin  Diet effective now             Neurological Examination   Mental Status: Alert, thought content appropriate.  Speech fluent without evidence of aphasia.  Able to follow 3 step commands without difficulty. Cranial Nerves: II: Discs flat bilaterally; Visual fields grossly normal, pupils equal, round, reactive to light and accommodation III,IV, VI: mild left ptosis, Full EOMs in the right eye.  Unable to go beyond midline medially with the left eye.  Limitation on upward and downward gaze.   V,VII: smile symmetric, facial light touch sensation normal bilaterally VIII: hearing normal bilaterally IX,X: gag reflex present XI: bilateral shoulder shrug XII: midline tongue extension Motor: Right :  Upper extremity   5/5 with foot drop                                       Left:     Upper extremity   5/5             Lower extremity   5/5                                                                          Lower extremity    5/5 Tone and bulk:normal tone throughout; no atrophy noted Sensory: Pinprick and light touch intact throughout, bilaterally Deep Tendon Reflexes: 2+ and symmetric with absent AJ's bilaterally Plantars: Right: mute                              Left: upgoing Cerebellar: Finger-to-nose testing dysmetric on the right.  Mild dysmetria with heel-to-shin testing on the right Gait: not tested due to safety concerns   Lab Results: Basic Metabolic Panel: Recent Labs  Lab 05/25/18 0913 05/26/18 0455  NA 132* 135  K 4.0 3.7  CL 97* 100  CO2 26 29  GLUCOSE 88 55*  BUN 12 13  CREATININE 0.72 0.65  CALCIUM 8.7* 9.0  MG 2.0  --     Liver Function Tests: Recent Labs  Lab 05/25/18 0913  AST 24  ALT 13  ALKPHOS 46  BILITOT 0.8  PROT 6.3*  ALBUMIN 3.9   No results for input(s): LIPASE, AMYLASE in the last 168 hours. No results for input(s): AMMONIA in the last 168 hours.  CBC: Recent Labs  Lab 05/25/18 0913 05/26/18 0455  WBC 4.9 5.3  NEUTROABS 3.3  --   HGB 12.1* 11.1*  HCT 35.5* 32.6*  MCV 94.4 92.9  PLT 177 184    Cardiac Enzymes: Recent Labs  Lab 05/25/18 0913  TROPONINI <0.03    Lipid Panel: No results for input(s): CHOL, TRIG, HDL, CHOLHDL, VLDL, LDLCALC in the last 168 hours.  CBG: Recent Labs  Lab 05/25/18 1707 05/25/18 2004 05/26/18 0739  GLUCAP 261* 172* 113*    Microbiology: Results for orders placed or performed in visit on 07/12/16  Microscopic Examination     Status: None   Collection Time: 07/12/16  2:58 PM  Result Value Ref Range Status   WBC, UA None seen 0 - 5 /hpf Final   RBC, UA 0-2 0 - 2 /hpf Final   Epithelial Cells (non renal) None seen 0 - 10 /hpf Final   Bacteria, UA None seen None seen/Few Final    Coagulation Studies: Recent Labs    05/25/18 0913  LABPROT 13.0  INR 0.99    Imaging: Ct Head Wo Contrast  Result Date: 05/25/2018 CLINICAL DATA:  Patient with dizziness. EXAM: CT HEAD WITHOUT CONTRAST TECHNIQUE:  Contiguous axial images were obtained from the base of the skull through the vertex without intravenous contrast. COMPARISON:  Brain CT 02/12/2018 FINDINGS: Brain: Ventricles and sulci are prominent compatible with atrophy. Chronic bilateral basal ganglia lacunar infarcts. Bilateral periventricular and subcortical white matter hypodensities most compatible with chronic microvascular ischemic changes. No evidence for acute cortically based infarct, intracranial hemorrhage, mass lesion or mass-effect. Chronic left midbrain infarct. Old right frontal lobe infarct. Old right thalamic infarct. Vascular: Unremarkable Skull: Intact. Sinuses/Orbits: Paranasal sinuses are well aerated. Mastoid air cells are unremarkable. Orbits unremarkable. Other: None. IMPRESSION: No acute intracranial process. Atrophy and chronic microvascular ischemic changes. Electronically Signed   By: Lovey Newcomer M.D.   On: 05/25/2018 10:09   Mr Brain Wo Contrast  Result Date: 05/25/2018 CLINICAL DATA:  Initial evaluation for acute dizziness, diplopia. EXAM: MRI HEAD WITHOUT CONTRAST TECHNIQUE: Multiplanar, multiecho pulse sequences of the brain and surrounding structures were obtained without intravenous contrast. COMPARISON:  Prior CT from earlier same day. FINDINGS: Brain: Diffuse prominence of the CSF containing spaces compatible generalized age-related cerebral atrophy. Patchy and confluent T2/FLAIR hyperintensity within the periventricular and deep white matter both cerebral hemispheres consistent with chronic micro vessel ischemic disease, moderate nature. Scatter remote lacunar infarcts present within the bilateral basal ganglia/corona radiata, thalami, and left paramedian pons. Remote right frontal cortical infarct with associated encephalomalacia and gliosis noted. 7 mm acute ischemic small vessel type infarcts seen at the left paramedian dorsal pons, involving the periaqueductal gray matter (series 2, image 20). No associated hemorrhage  or mass effect. No other evidence for acute or subacute ischemia. No other acute intracranial hemorrhage. Single chronic microhemorrhage noted within the left periatrial white matter, likely small vessel related. No mass lesion, midline shift or mass effect. Diffuse ventricular prominence related to global parenchymal volume loss without hydrocephalus. No extra-axial fluid collection. Vascular: Major intracranial vascular flow voids maintained. Skull and upper cervical spine: Craniocervical junction within normal limits. Upper cervical spine normal. Bone marrow signal intensity normal. Small lipoma noted at  the right frontal scalp. Sinuses/Orbits: Patient status post bilateral ocular lens replacement. Paranasal sinuses are clear. No mastoid effusion. Inner ear structures grossly normal. Other: None. IMPRESSION: 1. 7 mm acute ischemic nonhemorrhagic small vessel type infarct involving the left paramedian pons. 2. Underlying age-related cerebral atrophy with advanced chronic microvascular ischemic disease with multiple remote lacunar infarcts involving the bilateral basal ganglia/corona radiata, thalami, and pons. Additional chronic right frontal cortical infarct. Electronically Signed   By: Jeannine Boga M.D.   On: 05/25/2018 16:19   US Carotid Bilateral  Result Date: 05/25/2018 CLINICAL DATA:  TIA. EXAM: BILATERAL CAROTID DUPLEX ULTRASOUND TECHNIQUE: Pearline Cables scale imaging, color Doppler and duplex ultrasound were performed of bilateral carotid and vertebral arteries in the neck. COMPARISON:  CTA neck dated November 12, 2017. FINDINGS: Criteria: Quantification of carotid stenosis is based on velocity parameters that correlate the residual internal carotid diameter with NASCET-based stenosis levels, using the diameter of the distal internal carotid lumen as the denominator for stenosis measurement. The following velocity measurements were obtained: RIGHT ICA: 79 cm/sec CCA: 78 cm/sec SYSTOLIC ICA/CCA RATIO:  1.0  ECA: 90 cm/sec LEFT ICA: 74 cm/sec CCA: 80 cm/sec SYSTOLIC ICA/CCA RATIO:  0.9 ECA: 94 cm/sec RIGHT CAROTID ARTERY: Mild calcified plaque at the carotid bulb and proximal ICA. RIGHT VERTEBRAL ARTERY:  Antegrade flow. LEFT CAROTID ARTERY: Mild calcified plaque at the carotid bulb and proximal ICA. LEFT VERTEBRAL ARTERY:  Antegrade flow. IMPRESSION: 1. Atherosclerosis at the bilateral carotid bifurcations without significant stenosis. Electronically Signed   By: Titus Dubin M.D.   On: 05/25/2018 15:16   Dg Chest Port 1 View  Result Date: 05/25/2018 CLINICAL DATA:  Patient with generalized weakness. EXAM: PORTABLE CHEST 1 VIEW COMPARISON:  Chest radiograph 03/29/2017 FINDINGS: Monitoring leads overlie the patient. Stable cardiomegaly. Tortuosity of the thoracic aorta. No consolidative pulmonary opacities. No pleural effusion or pneumothorax. IMPRESSION: No acute cardiopulmonary process. Electronically Signed   By: Lovey Newcomer M.D.   On: 05/25/2018 10:11    Medications:  I have reviewed the patient's current medications. Prior to Admission:  Medications Prior to Admission  Medication Sig Dispense Refill Last Dose  . Calcium Carbonate-Vitamin D 600-400 MG-UNIT tablet Take 1 tablet by mouth 2 (two) times daily.   05/24/2018 at 1800  . clopidogrel (PLAVIX) 75 MG tablet Take 75 mg by mouth daily.   05/24/2018 at 0800  . finasteride (PROSCAR) 5 MG tablet Take 5 mg by mouth daily.   05/24/2018 at 0800  . gabapentin (NEURONTIN) 300 MG capsule Take 300 mg by mouth at bedtime.   05/24/2018 at 2000  . galantamine (RAZADYNE ER) 8 MG 24 hr capsule Take 8 mg by mouth daily with breakfast.   05/24/2018 at 0800  . glipiZIDE (GLUCOTROL) 5 MG tablet Take 5 mg by mouth daily before breakfast.   05/24/2018 at 0800  . LANTUS SOLOSTAR 100 UNIT/ML Solostar Pen Inject 20 Units into the skin daily.    05/24/2018 at 0800  . levothyroxine (SYNTHROID, LEVOTHROID) 25 MCG tablet Take 25 mcg by mouth at bedtime.    05/24/2018 at 2000   . lisinopril (PRINIVIL,ZESTRIL) 10 MG tablet Take 10 mg by mouth daily.    05/24/2018 at 0800  . metFORMIN (GLUCOPHAGE) 500 MG tablet Take 1,000 mg by mouth 2 (two) times daily.    05/24/2018 at 1800  . polyethylene glycol (MIRALAX / GLYCOLAX) packet Take 17 g by mouth daily. (Patient taking differently: Take 17 g by mouth daily as needed for moderate constipation. ) 14  each 0 Unknown at PRN  . PROLIA 60 MG/ML SOSY injection Inject 60 mg into the skin every 6 (six) months.   As directed at As directed  . senna-docusate (SENOKOT-S) 8.6-50 MG tablet Take 1 tablet by mouth at bedtime as needed for mild constipation. 30 tablet 0 Unknown at PRN  . simvastatin (ZOCOR) 20 MG tablet Take 20 mg by mouth every evening.    05/24/2018 at 1800  . tamsulosin (FLOMAX) 0.4 MG CAPS capsule Take 0.4 mg by mouth 2 (two) times daily.   05/24/2018 at 2000  . vitamin B-12 (CYANOCOBALAMIN) 1000 MCG tablet Take 1,000 mcg daily by mouth.   05/24/2018 at 0800  . insulin aspart (NOVOLOG) 100 UNIT/ML FlexPen Inject 5 Units into the skin 3 (three) times daily with meals. (Patient not taking: Reported on 07/12/2016) 15 mL 11 Not Taking at Unknown time   Scheduled: . aspirin  81 mg Oral Daily  . calcium-vitamin D  1 tablet Oral BID  . clopidogrel  75 mg Oral Daily  . enoxaparin (LOVENOX) injection  40 mg Subcutaneous Q24H  . finasteride  5 mg Oral Daily  . gabapentin  300 mg Oral QHS  . galantamine  8 mg Oral Q breakfast  . insulin aspart  0-5 Units Subcutaneous QHS  . insulin aspart  0-9 Units Subcutaneous TID WC  . [START ON 05/27/2018] insulin glargine  15 Units Subcutaneous QHS  . levothyroxine  25 mcg Oral QHS  . lisinopril  10 mg Oral Daily  . metFORMIN  1,000 mg Oral BID WC  . simvastatin  20 mg Oral QPM  . tamsulosin  0.4 mg Oral BID  . vitamin B-12  1,000 mcg Oral Daily   Assessment: 85 y.o. male  with past medical history of arthritis, diabetes mellitus, hyperlipidemia, hypertension, hypothyroidism, osteoporosis  with pelvic fracture, and TIA presenting with diplopia, vertigo and difficulty with gait. Neurological examination significant for third cranial nerve abnormalities and mild dysmetria. MRI of the brain reviewed and shows 7 mm acute ischemic nonhemorrhagic infarct involving the left paramedian pons.  Etiology likely small vessel disease. US carotids bilateral did not shows significant hemodynamically stenosis.  Patient was on Plavix 75 mg once a day prior to this event.  Plan: 1. HgbA1c, fasting lipid panel 2. Start medical management with dual therapy Aspirin 81 mg/day and Plavix 75 mg /day with intensive management of vascular risk factor to keep systolic BP (SBP) <456 mm Hg (130 mm Hg if diabetic) 3. Statin with goal  low density lipoprotein (LDL) <70 mg/d 4. Glycemic control with goal HgbA1c <7 5. PT consult, OT consult, Speech consult 6. Echocardiogram  pending 7.Telemetry monitoring  This patient was staffed with Dr. Paschal Dopp, Babette Relic who personally evaluated patient, reviewed documentation and agreed with assessment and plan of care as above.  Rufina Falco, DNP, FNP-BC Board certified Nurse Practitioner Neurology Department   LOS: 1 day   05/26/2018  10:23 AM

## 2018-05-26 NOTE — Evaluation (Signed)
Clinical/Bedside Swallow Evaluation Patient Details  Name: Brian Barry MRN: [id number] Date of Birth: 16-Nov-1929  Today's Date: 05/26/2018 Time: SLP Start Time (ACUTE ONLY): 58 SLP Stop Time (ACUTE ONLY): 1300 SLP Time Calculation (min) (ACUTE ONLY): 30 min  Past Medical History:  Past Medical History:  Diagnosis Date  . Arthritis   . BPH (benign prostatic hyperplasia)   . DM (diabetes mellitus) ([place])   . Hyperlipidemia   . Hypertension   . Hypothyroidism   . Osteoporosis   . Pelvic fracture ([place])   . TIA (transient ischemic attack) 1998, 2014   Past Surgical History:  Past Surgical History:  Procedure Laterality Date  . bilateral hip replacement Bilateral 1998  . Left knee replacement Left 2007  . LUMBAR LAMINECTOMY  2010  . right hip revision Right 2008   HPI:  Per admitting H&P: Brian Barry  is a 85 y.o. male with a known history of prior history of strokes, hypertension, hyperlipidemia, arthritis, diabetes, BPH who is from [facility] independent facility presents to hospital secondary to worsening dizziness and weakness.   Assessment / Plan / Recommendation Clinical Impression  Patient appears to present with functional swallowing abilities at bedside. No overt s/s aspiration observed with any consistency tested. Oral phase and oral mech exam WFL. Adequate oral prep/coordination and A-P transit time with all consistencies tested (thin, puree, soft solid). No oral residue observed. No overt s/s pharyngeal dysphagia observed. Swallow initiation appeared timely. Vocal quality remained clear thoughout evaluation. Pt, daughter, and nursing deny any hx of dysphagia and deny any s/s aspiration with current regular diet. Educated pt and daughter re: aspiration precautions and general safe swallow recommendations. Pt and daughter stated agreement. Recommend continue with current regular diet with thin liquids, may continue to give meds whole with thin liquid. Discussed results of  evaluation with nursing, nursing in agreement. SLP to f/u with toleration of diet and sign off if pt presents with no further needs identified. SLP Visit Diagnosis: Dysphagia, unspecified (R13.10)    Aspiration Risk  No limitations    Diet Recommendation Regular;Thin liquid   Liquid Administration via: Cup;Straw Medication Administration: Whole meds with liquid Supervision: Patient able to self feed Compensations: Minimize environmental distractions;Slow rate;Small sips/bites Postural Changes: Seated upright at 90 degrees;Remain upright for at least 30 minutes after po intake    Other  Recommendations Oral Care Recommendations: Oral care before and after PO   Follow up Recommendations None      Frequency and Duration min 1 x/week  1 week       Prognosis Prognosis for Safe Diet Advancement: Good      Swallow Study   General Date of Onset: 05/26/18 HPI: Per admitting H&P: Brian Barry  is a 85 y.o. male with a known history of prior history of strokes, hypertension, hyperlipidemia, arthritis, diabetes, BPH who is from [facility] independent facility presents to hospital secondary to worsening dizziness and weakness. Type of Study: Bedside Swallow Evaluation Diet Prior to this Study: Regular;Thin liquids Temperature Spikes Noted: No Respiratory Status: Room air History of Recent Intubation: No Behavior/Cognition: Alert;Cooperative;Pleasant mood Oral Cavity Assessment: Dried secretions Oral Care Completed by SLP: No Oral Cavity - Dentition: Adequate natural dentition Vision: Functional for self-feeding Self-Feeding Abilities: Able to feed self Patient Positioning: Upright in chair Baseline Vocal Quality: Low vocal intensity    Oral/Motor/Sensory Function Overall Oral Motor/Sensory Function: Within functional limits   Ice Chips Ice chips: Not tested   Thin Liquid Thin Liquid: Within functional limits Presentation:  Cup;Self Fed;Straw    Nectar Thick Nectar Thick Liquid:  Not tested   Honey Thick Honey Thick Liquid: Not tested   Puree Puree: Within functional limits   Solid     Solid: Within functional limits      Daffney Greenly, MA, CCC-SLP 05/26/2018,3:11 PM

## 2018-05-26 NOTE — Progress Notes (Signed)
Inpatient Diabetes Program Recommendations  AACE/ADA: New Consensus Statement on Inpatient Glycemic Control  Target Ranges:  Prepandial:   less than 140 mg/dL      Peak postprandial:   less than 180 mg/dL (1-2 hours)      Critically ill patients:  140 - 180 mg/dL  Results for Brian Barry, Brian Barry (MRN [id number]) as of 05/26/2018 09:04  Ref. Range 05/26/2018 04:55  Glucose Latest Ref Range: 70 - 99 mg/dL 55 (L)   Results for Brian Barry, Brian Barry (MRN [id number]) as of 05/26/2018 09:04  Ref. Range 05/25/2018 17:07 05/25/2018 20:04 05/26/2018 07:39  Glucose-Capillary Latest Ref Range: 70 - 99 mg/dL 261 (H) 172 (H) 113 (H)    Review of Glycemic Control  Diabetes history: DM2 Outpatient Diabetes medications: Glipizide 5 mg QAM, Lantus 20 units daily, Metformin 1000 mg BID Current orders for Inpatient glycemic control: Glipizide 5 mg QAM, Lantus 20 units daily, Metformin 1000 mg BID, Novolog 0-9 units TID with meals, Novolog 0-5 units QHS   Inpatient Diabetes Program Recommendations:  Insulin - Basal: Lab glucose 55 mg/dl this morning. Lantus 20 units given at 17:31 on 05/25/18. Please decrease Lantus to 15 units and change frequency to QHS. Oral Agents: Please discontinue Glipizide while inpatient.  Thanks, Barnie Alderman, RN, MSN, CDE Diabetes Coordinator Inpatient Diabetes Program [phone] (Team Pager from 8am to 5pm)

## 2018-05-26 NOTE — Progress Notes (Signed)
Patient complaining of blurred vision, NIH has been 0 throughout the night, notified Dr. Marcille Blanco on call. No new orders received.

## 2018-05-26 NOTE — Plan of Care (Signed)
  Problem: Education: Goal: Knowledge of General Education information will improve Description Including pain rating scale, medication(s)/side effects and non-pharmacologic comfort measures Outcome: Progressing   Problem: Health Behavior/Discharge Planning: Goal: Ability to manage health-related needs will improve Outcome: Progressing   Problem: Clinical Measurements: Goal: Ability to maintain clinical measurements within normal limits will improve Outcome: Progressing Goal: Will remain free from infection Outcome: Progressing Goal: Diagnostic test results will improve Outcome: Progressing Goal: Respiratory complications will improve Outcome: Progressing Goal: Cardiovascular complication will be avoided Outcome: Progressing   Problem: Activity: Goal: Risk for activity intolerance will decrease Outcome: Progressing   Problem: Nutrition: Goal: Adequate nutrition will be maintained Outcome: Progressing   Problem: Coping: Goal: Level of anxiety will decrease Outcome: Progressing   Problem: Elimination: Goal: Will not experience complications related to bowel motility Outcome: Progressing Goal: Will not experience complications related to urinary retention Outcome: Progressing   Problem: Pain Managment: Goal: General experience of comfort will improve Outcome: Progressing   Problem: Safety: Goal: Ability to remain free from injury will improve Outcome: Progressing   Problem: Skin Integrity: Goal: Risk for impaired skin integrity will decrease Outcome: Progressing   Problem: Education: Goal: Knowledge of patient specific risk factors addressed and post discharge goals established will improve Outcome: Progressing Goal: Individualized Educational Video(s) Outcome: Progressing   Problem: Coping: Goal: Will identify appropriate support needs Outcome: Progressing

## 2018-05-26 NOTE — Progress Notes (Signed)
*  PRELIMINARY RESULTS* Echocardiogram 2D Echocardiogram has been performed.  Brian Barry 05/26/2018, 12:52 PM

## 2018-05-26 NOTE — Evaluation (Signed)
Occupational Therapy Evaluation Patient Details Name: Brian Barry MRN: [id number] DOB: 1930-05-01 Today's Date: 05/26/2018    History of Present Illness Pt is an 85 y.o. male presenting to hospital 05/25/18 with double vission, dizziness, increased lethargy, weakness, and off balance walking.  Pt admitted with 27mm acute ischemic nonhemorrhagic infarct L paramedian pons and diplopia.  PMH includes TIA's, CVA, DM, htn, PNA, pelvic fx, AAA, hip fx 2017, B THR, L TKR, R hip revision, lumbar lami, h/o R foot drop.   Clinical Impression   Pt seen for OT evaluation this date. Pt awake and alert upon arrival to room with breakfast tray and caregiver (wife) present. Pt alert and oriented x4. Prior to hospital admission, pt required baseline assistance from a caregiver for ADL/IADL, and was using a 4WW for all mobility. PMH of dementia. Pt and caregiver deny falls history in past 12 months. Pt lives with his spouse in a 1 level villa at the [place] with no steps to enter. Currently pt reporting he is feeling better, despite some continued weakness. Pt demonstrates baseline independence to perform ADL and mobility tasks. No skilled OT needs identified relating to this admission. Recommend HHOT to support safety within the home and upon DC. Will sign off. Please re-consult if additional OT needs arise.     Follow Up Recommendations  Home health OT    Equipment Recommendations  Other (comment)(TBD at next venue of care. )    Recommendations for Other Services       Precautions / Restrictions Precautions Precautions: Fall Restrictions Weight Bearing Restrictions: No      Mobility Bed Mobility Overal bed mobility: Needs Assistance Bed Mobility: Supine to Sit;Sit to Supine     Supine to sit: HOB elevated;Supervision;Modified independent (Device/Increase time) Sit to supine: Modified independent (Device/Increase time);Supervision   General bed mobility comments: pt had difficulty  moving on sheets requiring increased time and use of bed rail with VC for hand placement. Pt able to complete lateral scoots in order to reposition while seated EOB.   Transfers Overall transfer level: Needs assistance Equipment used: Rolling walker (2 wheeled) Transfers: Sit to/from [facility] Sit to Stand: Mod assist;Min guard Stand pivot transfers: Min guard       General transfer comment: mod assist to initiate stand from bed with vc's for UE/LE placement and scooting to edge of bed; CGA to stand from recliner with minimal cueing    Balance Overall balance assessment: Needs assistance Sitting-balance support: No upper extremity supported;Feet supported Sitting balance-Leahy Scale: Good Sitting balance - Comments: Steady sitting reaching within BOS   Standing balance support: No upper extremity supported Standing balance-Leahy Scale: Fair Standing balance comment: steady static standing using urinal with assist of wife                           ADL either performed or assessed with clinical judgement   ADL Overall ADL's : At baseline;Needs assistance/impaired Eating/Feeding: Independent;Supervision/ safety   Grooming: Set up;Supervision/safety   Upper Body Bathing: Supervision/ safety;Set up   Lower Body Bathing: Supervison/ safety;Set up   Upper Body Dressing : Supervision/safety;Set up   Lower Body Dressing: Supervision/safety;Set up;With adaptive equipment                 General ADL Comments: Pt reporting generalized weakness which may impact ADL function. However, at baseline he recieves support from his wife or uses AE. Wife provides physical assist on occasion (  including getting into/out of shower). Pt independent with AE for bathing, dressing, grooming. Require supervision for safety particularly with functional mobility 2/2 weakness.       Vision Baseline Vision/History: Wears glasses Wears Glasses: At all times Patient  Visual Report: Diplopia;Blurring of vision Additional Comments: Pt reported double vision at start of evaluation. During brief visual assessment, Pt able to accurately identify number of fingers being held up by this OT 5/5 trials.      Perception     Praxis      Pertinent Vitals/Pain Pain Assessment: No/denies pain     Hand Dominance     Extremity/Trunk Assessment Upper Extremity Assessment Upper Extremity Assessment: Overall WFL for tasks assessed(UE strength grossly 4+/5. No noticiable differences between RUE/LUE appreciated during this evaluation. )   Lower Extremity Assessment Lower Extremity Assessment: Defer to PT evaluation;RLE deficits/detail RLE Deficits / Details: Decreased heel to shin in BLE. Pt and wife reported foot drop at baseline. Noted decrease in active DF in RLE.  LLE Deficits / Details: 5/5 hip flexion, knee flexion/extension, and DF   Cervical / Trunk Assessment Cervical / Trunk Assessment: (forward head noted)   Communication Communication Communication: Other (comment)(Will require increased time to respond, on occasion. )   Cognition Arousal/Alertness: Awake/alert Behavior During Therapy: Flat affect Overall Cognitive Status: History of cognitive impairments - at baseline                                     General Comments       Exercises Other Exercises Other Exercises: Instructed pt on safe use of bed railing for bed mobility and safety. Discussed potential options available for purchase, provided caregiver education on benefits of attachable bed railing for home use.  Other Exercises: Discussed safe use of DME/AE to improve independence and decrease risk of falls within the home.    Shoulder Instructions      Home Living Family/patient expects to be discharged to:: Private residence Living Arrangements: Spouse/significant other Available Help at Discharge: Family;Available 24 hours/day Type of Home: Independent living  facility Home Access: Level entry     Home Layout: One level     Bathroom Shower/Tub: Teacher, early years/pre: Standard Bathroom Accessibility: Yes   Home Equipment: Walker - 4 wheels;Walker - 2 wheels;Cane - single point;Shower seat   Additional Comments: Lives at [place] with spouse      Prior Functioning/Environment Level of Independence: Independent with assistive device(s);Needs assistance  Gait / Transfers Assistance Needed: Ambulates with rollator with supervision of spouse for safety (has recently been walking to bathroom at night alone) ADL's / Homemaking Assistance Needed: Modified independent dressing with adaptive equipment.  Assist in/out of shower and then pt sits in shower and baths self.   Comments: Pt and pt's wife report no falls in past 6 months.        OT Problem List: Decreased strength;Decreased range of motion;Decreased activity tolerance;Impaired balance (sitting and/or standing);Decreased coordination;Decreased cognition;Decreased knowledge of use of DME or AE;Decreased safety awareness      OT Treatment/Interventions:      OT Goals(Current goals can be found in the care plan section) Acute Rehab OT Goals Patient Stated Goal: To feel better and go home.  OT Goal Formulation: All assessment and education complete, DC therapy Time For Goal Achievement: 05/26/18 Potential to Achieve Goals: Good  OT Frequency:     Barriers to  D/C:            Co-evaluation              AM-PAC OT "6 Clicks" Daily Activity     Outcome Measure Help from another person eating meals?: A Little Help from another person taking care of personal grooming?: A Little Help from another person toileting, which includes using toliet, bedpan, or urinal?: A Little Help from another person bathing (including washing, rinsing, drying)?: A Little Help from another person to put on and taking off regular upper body clothing?: A Little Help from  another person to put on and taking off regular lower body clothing?: A Little 6 Click Score: 18   End of Session    Activity Tolerance: Patient tolerated treatment well Patient left: in bed;with call bell/phone within reach;with bed alarm set;with family/visitor present  OT Visit Diagnosis: Other abnormalities of gait and mobility (R26.89);History of falling (Z91.81);Muscle weakness (generalized) (M62.81);Other symptoms and signs involving cognitive function                Time: 8588-5027 OT Time Calculation (min): 38 min Charges:  OT General Charges $OT Visit: 1 Visit OT Evaluation $OT Eval Low Complexity: 1 Low OT Treatments $Self Care/Home Management : 8-22 mins  Climmie Buelow, OTR/L 05/26/18, 12:58 PM

## 2018-05-26 NOTE — Progress Notes (Signed)
Brian Barry NAME: Brian Barry    MR#:  [id number]  DATE OF BIRTH:  January 12, 1930  SUBJECTIVE:admitted because of blurred vision, gait problems and found to have acute stroke.  Still has some blurred vision, gait problems.  CHIEF COMPLAINT:   Chief Complaint  Patient presents with  . Weakness    REVIEW OF SYSTEMS:   ROS CONSTITUTIONAL: No fever, fatigue or weakness.  EYES: No blurred or double vision.  EARS, NOSE, AND THROAT: No tinnitus or ear pain.  RESPIRATORY: No cough, shortness of breath, wheezing or hemoptysis.  CARDIOVASCULAR: No chest pain, orthopnea, edema.  GASTROINTESTINAL: No nausea, vomiting, diarrhea or abdominal pain.  GENITOURINARY: No dysuria, hematuria.  ENDOCRINE: No polyuria, nocturia,  HEMATOLOGY: No anemia, easy bruising or bleeding SKIN: No rash or lesion. MUSCULOSKELETAL: No joint pain or arthritis.   NEUROLOGIC: No tingling, numbness, weakness.  Blurred vision, ataxia. PSYCHIATRY: No anxiety or depression.   DRUG ALLERGIES:  No Known Allergies  VITALS:  Blood pressure (!) 137/57, pulse (!) 58, temperature 98.2 F (36.8 C), temperature source Oral, resp. rate 16, height 5\' 8"  (1.727 m), weight 74.9 kg, SpO2 97 %.  PHYSICAL EXAMINATION:  GENERAL:  85 y.o.-year-old patient lying in the bed with no acute distress.  EYES: Pupils equal, round, reactive to light and accommodation. No scleral icterus. Extraocular muscles intact.  HEENT: Head atraumatic, normocephalic. Oropharynx and nasopharynx clear.  NECK:  Supple, no jugular venous distention. No thyroid enlargement, no tenderness.  LUNGS: Normal breath sounds bilaterally, no wheezing, rales,rhonchi or crepitation. No use of accessory muscles of respiration.  CARDIOVASCULAR: S1, S2 normal. No murmurs, rubs, or gallops.  ABDOMEN: Soft, nontender, nondistended. Bowel sounds present. No organomegaly or mass.  EXTREMITIES: No pedal edema, cyanosis,  or clubbing.  NEUROLOGIC: Cranial nerves II through XII are intact. Muscle strength 5/5 in all extremities. Sensation intact. Gait not checked.  PSYCHIATRIC: The patient is alert and oriented x 3.  SKIN: No obvious rash, lesion, or ulcer.    LABORATORY PANEL:   CBC Recent Labs  Lab 05/26/18 0455  WBC 5.3  HGB 11.1*  HCT 32.6*  PLT 184   ------------------------------------------------------------------------------------------------------------------  Chemistries  Recent Labs  Lab 05/25/18 0913 05/26/18 0455  NA 132* 135  K 4.0 3.7  CL 97* 100  CO2 26 29  GLUCOSE 88 55*  BUN 12 13  CREATININE 0.72 0.65  CALCIUM 8.7* 9.0  MG 2.0  --   AST 24  --   ALT 13  --   ALKPHOS 46  --   BILITOT 0.8  --    ------------------------------------------------------------------------------------------------------------------  Cardiac Enzymes Recent Labs  Lab 05/25/18 0913  TROPONINI <0.03   ------------------------------------------------------------------------------------------------------------------  RADIOLOGY:  Ct Head Wo Contrast  Result Date: 05/25/2018 CLINICAL DATA:  Patient with dizziness. EXAM: CT HEAD WITHOUT CONTRAST TECHNIQUE: Contiguous axial images were obtained from the base of the skull through the vertex without intravenous contrast. COMPARISON:  Brain CT 02/12/2018 FINDINGS: Brain: Ventricles and sulci are prominent compatible with atrophy. Chronic bilateral basal ganglia lacunar infarcts. Bilateral periventricular and subcortical white matter hypodensities most compatible with chronic microvascular ischemic changes. No evidence for acute cortically based infarct, intracranial hemorrhage, mass lesion or mass-effect. Chronic left midbrain infarct. Old right frontal lobe infarct. Old right thalamic infarct. Vascular: Unremarkable Skull: Intact. Sinuses/Orbits: Paranasal sinuses are well aerated. Mastoid air cells are unremarkable. Orbits unremarkable. Other: None.  IMPRESSION: No acute intracranial process. Atrophy and chronic microvascular ischemic changes.  Electronically Signed   By: Lovey Newcomer M.D.   On: 05/25/2018 10:09   Mr Brain Wo Contrast  Result Date: 05/25/2018 CLINICAL DATA:  Initial evaluation for acute dizziness, diplopia. EXAM: MRI HEAD WITHOUT CONTRAST TECHNIQUE: Multiplanar, multiecho pulse sequences of the brain and surrounding structures were obtained without intravenous contrast. COMPARISON:  Prior CT from earlier same day. FINDINGS: Brain: Diffuse prominence of the CSF containing spaces compatible generalized age-related cerebral atrophy. Patchy and confluent T2/FLAIR hyperintensity within the periventricular and deep white matter both cerebral hemispheres consistent with chronic micro vessel ischemic disease, moderate nature. Scatter remote lacunar infarcts present within the bilateral basal ganglia/corona radiata, thalami, and left paramedian pons. Remote right frontal cortical infarct with associated encephalomalacia and gliosis noted. 7 mm acute ischemic small vessel type infarcts seen at the left paramedian dorsal pons, involving the periaqueductal gray matter (series 2, image 20). No associated hemorrhage or mass effect. No other evidence for acute or subacute ischemia. No other acute intracranial hemorrhage. Single chronic microhemorrhage noted within the left periatrial white matter, likely small vessel related. No mass lesion, midline shift or mass effect. Diffuse ventricular prominence related to global parenchymal volume loss without hydrocephalus. No extra-axial fluid collection. Vascular: Major intracranial vascular flow voids maintained. Skull and upper cervical spine: Craniocervical junction within normal limits. Upper cervical spine normal. Bone marrow signal intensity normal. Small lipoma noted at the right frontal scalp. Sinuses/Orbits: Patient status post bilateral ocular lens replacement. Paranasal sinuses are clear. No mastoid  effusion. Inner ear structures grossly normal. Other: None. IMPRESSION: 1. 7 mm acute ischemic nonhemorrhagic small vessel type infarct involving the left paramedian pons. 2. Underlying age-related cerebral atrophy with advanced chronic microvascular ischemic disease with multiple remote lacunar infarcts involving the bilateral basal ganglia/corona radiata, thalami, and pons. Additional chronic right frontal cortical infarct. Electronically Signed   By: Jeannine Boga M.D.   On: 05/25/2018 16:19   US Carotid Bilateral  Result Date: 05/25/2018 CLINICAL DATA:  TIA. EXAM: BILATERAL CAROTID DUPLEX ULTRASOUND TECHNIQUE: Pearline Cables scale imaging, color Doppler and duplex ultrasound were performed of bilateral carotid and vertebral arteries in the neck. COMPARISON:  CTA neck dated November 12, 2017. FINDINGS: Criteria: Quantification of carotid stenosis is based on velocity parameters that correlate the residual internal carotid diameter with NASCET-based stenosis levels, using the diameter of the distal internal carotid lumen as the denominator for stenosis measurement. The following velocity measurements were obtained: RIGHT ICA: 79 cm/sec CCA: 78 cm/sec SYSTOLIC ICA/CCA RATIO:  1.0 ECA: 90 cm/sec LEFT ICA: 74 cm/sec CCA: 80 cm/sec SYSTOLIC ICA/CCA RATIO:  0.9 ECA: 94 cm/sec RIGHT CAROTID ARTERY: Mild calcified plaque at the carotid bulb and proximal ICA. RIGHT VERTEBRAL ARTERY:  Antegrade flow. LEFT CAROTID ARTERY: Mild calcified plaque at the carotid bulb and proximal ICA. LEFT VERTEBRAL ARTERY:  Antegrade flow. IMPRESSION: 1. Atherosclerosis at the bilateral carotid bifurcations without significant stenosis. Electronically Signed   By: Titus Dubin M.D.   On: 05/25/2018 15:16   Dg Chest Port 1 View  Result Date: 05/25/2018 CLINICAL DATA:  Patient with generalized weakness. EXAM: PORTABLE CHEST 1 VIEW COMPARISON:  Chest radiograph 03/29/2017 FINDINGS: Monitoring leads overlie the patient. Stable cardiomegaly.  Tortuosity of the thoracic aorta. No consolidative pulmonary opacities. No pleural effusion or pneumothorax. IMPRESSION: No acute cardiopulmonary process. Electronically Signed   By: Lovey Newcomer M.D.   On: 05/25/2018 10:11    EKG:   Orders placed or performed during the hospital encounter of 05/25/18  . EKG 12-Lead  . EKG  12-Lead    ASSESSMENT AND PLAN:   85 year old male patient with history of diabetes mellitus type 2, hyperlipidemia, hypertension, hypothyroidism comes in because of vertigo, difficulty walking, diplopia,   patient MRI of the brain shows 7 mm acute stroke in the left pons: Patient has absolutely no symptoms except some blurred double vision, vertigo which is much better than before, continue aspirin, Plavix, speech and PT evaluation, echocardiogram, carotid ultrasound of carotids did not show hemodynamically significant stenosis. Continue aspirin, Plavix. 2/diabetes mellitus type 2, hypoglycemia today, received Lantus at night wife told me that they usually give Lantus in the morning and he is on Lantus 20 units in the morning.  Hypoglycemia likely secondary to not eating yesterday indeed taking insulin.  Decrease the dose of insulin but giving at night, start the back on Lantus morning dose when discharged. 3.  BPH: Continue Flomax. #4. diabetes mellitus type 2:  hypoglycemia today morning, decreased dose of Lantus, can resume Lantus in the morning at  discharge  All the records are reviewed and case discussed with Care Management/Social Workerr. Management plans discussed with the patient, family and they are in agreement.  CODE STATUS: Full code  TOTAL TIME TAKING CARE OF THIS PATIENT: 38 minutes.   POSSIBLE D/C IN 1-2DAYS, DEPENDING ON CLINICAL CONDITION.   Epifanio Lesches M.D on 05/26/2018 at 12:55 PM  Between 7am to 6pm - Pager - [phone]  After 6pm go to [email or web address] - password [email or web address] [facility]  Office   [phone]  CC: Primary care physician; Derinda Late, MD   Note: This dictation was prepared with Dragon dictation along with smaller phrase technology. Any transcriptional errors that result from this process are unintentional.

## 2018-05-26 NOTE — Evaluation (Signed)
Physical Therapy Evaluation Patient Details Name: Brian Barry MRN: [id number] DOB: 21-Jun-1929 Today's Date: 05/26/2018   History of Present Illness  Pt is an 85 y.o. male presenting to hospital 05/25/18 with double vission, dizziness, increased lethargy, weakness, and off balance walking.  Pt admitted with 55mm acute ischemic nonhemorrhagic infarct L paramedian pons and diplopia.  PMH includes TIA's, CVA, DM, htn, PNA, pelvic fx, AAA, hip fx 2017, B THR, L TKR, R hip revision, lumbar lami, h/o R foot drop.  Clinical Impression  Prior to hospital admission, pt was ambulatory with rollator with supervision.  Pt lives with his wife in independent living section of [facility].  Currently pt is mod assist semi-supine to sit; mod assist to stand from bed but CGA to stand from recliner with RW use; and CGA to ambulate 120 feet with RW.  Impaired R LE coordination and R LE strength noted; pt also demonstrating difficulty advancing R LE during ambulation intermittently (R LE "dragging" behind pt at times; also h/o R foot drop--pt does not wear brace for)--pt's wife reports R LE "dragging" behind pt is increased compared to baseline.  Otherwise pt steady with ambulation using RW and pt and pt's wife educated on cueing to promote safe ambulation (pt's wife verbalizing appropriate understanding).  Pt reporting blurry vision during session (although pt inconsistent with reports); improved vision noted with pt wearing his glasses (although pt reports he does not normally wear glasses for ambulation).  Pt would benefit from skilled PT to address noted impairments and functional limitations (see below for any additional details).  Upon hospital discharge, recommend pt discharge with HHPT and 24/7 assist for functional mobility.     Follow Up Recommendations Home health PT;Supervision/Assistance - 24 hour    Equipment Recommendations  Rolling walker with 5" wheels    Recommendations for Other Services OT consult      Precautions / Restrictions Precautions Precautions: Fall Restrictions Weight Bearing Restrictions: No      Mobility  Bed Mobility Overal bed mobility: Needs Assistance Bed Mobility: Supine to Sit     Supine to sit: Mod assist;HOB elevated     General bed mobility comments: pt had difficulty moving on sheets requiring increased time and use of bed rail (mod assist to shift hips forward)  Transfers Overall transfer level: Needs assistance Equipment used: Rolling walker (2 wheeled) Transfers: Sit to/from [facility] Sit to Stand: Mod assist;Min guard Stand pivot transfers: Min guard       General transfer comment: mod assist to initiate stand from bed with vc's for UE/LE placement and scooting to edge of bed; CGA to stand from recliner with minimal cueing  Ambulation/Gait Ambulation/Gait assistance: Min guard Gait Distance (Feet): 120 Feet Assistive device: Rolling walker (2 wheeled)   Gait velocity: decreased   General Gait Details: pt intermittently with difficulty advancing R LE (catching R foot on floor/R LE intermittently "dragging" behind pt)--improved with cueing for stepping technique and 2 brief standing breaks to re-set; otherwise steady with RW use  Stairs            Wheelchair Mobility    Modified Rankin (Stroke Patients Only)       Balance Overall balance assessment: Needs assistance Sitting-balance support: No upper extremity supported;Feet supported Sitting balance-Leahy Scale: Good Sitting balance - Comments: steady sitting reaching within BOS   Standing balance support: No upper extremity supported Standing balance-Leahy Scale: Fair Standing balance comment: steady static standing using urinal with assist of wife  Pertinent Vitals/Pain Pain Assessment: No/denies pain  HR 54 bpm at rest and increased to 81 bpm with activity.  O2 sats WFL on room air during session.    Home  Living Family/patient expects to be discharged to:: Private residence Living Arrangements: Spouse/significant other Available Help at Discharge: Family;Available 24 hours/day Type of Home: Independent living facility Home Access: Level entry     Home Layout: One level Home Equipment: Walker - 4 wheels;Walker - 2 wheels;Cane - single point;Shower seat Additional Comments: Lives at [facility] with spouse    Prior Function Level of Independence: Independent with assistive device(s);Needs assistance   Gait / Transfers Assistance Needed: Ambulates with rollator with supervision of spouse for safety (has recently been walking to bathroom at night alone)  ADL's / Homemaking Assistance Needed: Modified independent dressing with adaptive equipment.  Assist in/out of shower and then pt sits in shower and baths self.  Comments: Pt and pt's wife report no falls in past 6 months.     Hand Dominance        Extremity/Trunk Assessment   Upper Extremity Assessment Upper Extremity Assessment: Defer to OT evaluation    Lower Extremity Assessment Lower Extremity Assessment: RLE deficits/detail;LLE deficits/detail(Intact B LE sensation, proprioception, tone) RLE Deficits / Details: decreased R heel to shin coordination; 4+/5 hip flexion, 4+/5 knee extension and flexion; able to extend toes but no active DF (h/o foot drop) LLE Deficits / Details: 5/5 hip flexion, knee flexion/extension, and DF    Cervical / Trunk Assessment Cervical / Trunk Assessment: (forward head noted)  Communication   Communication: Other (comment)(Mild increased time to respond at times)  Cognition Arousal/Alertness: Awake/alert Behavior During Therapy: Flat affect Overall Cognitive Status: History of cognitive impairments - at baseline                                        General Comments   Nursing cleared pt for participation in physical therapy.  Pt agreeable to PT session.  Pt's  wife and daughter present during session.    Exercises  Transfer and gait training   Assessment/Plan    PT Assessment Patient needs continued PT services  PT Problem List Decreased strength;Decreased activity tolerance;Decreased balance;Decreased mobility;Decreased knowledge of precautions       PT Treatment Interventions DME instruction;Gait training;Functional mobility training;Therapeutic activities;Therapeutic exercise;Balance training;Patient/family education    PT Goals (Current goals can be found in the Care Plan section)  Acute Rehab PT Goals Patient Stated Goal: to improve walking PT Goal Formulation: With patient/family Time For Goal Achievement: 06/09/18 Potential to Achieve Goals: Good    Frequency 7X/week   Barriers to discharge        Co-evaluation               AM-PAC PT "6 Clicks" Mobility  Outcome Measure Help needed turning from your back to your side while in a flat bed without using bedrails?: A Little Help needed moving from lying on your back to sitting on the side of a flat bed without using bedrails?: A Lot Help needed moving to and from a bed to a chair (including a wheelchair)?: A Lot Help needed standing up from a chair using your arms (e.g., wheelchair or bedside chair)?: A Little Help needed to walk in hospital room?: A Little Help needed climbing 3-5 steps with a railing? : A Lot 6 Click Score: 15  End of Session Equipment Utilized During Treatment: Gait belt Activity Tolerance: Patient tolerated treatment well Patient left: in chair;with call bell/phone within reach;with chair alarm set;with family/visitor present Nurse Communication: Mobility status;Precautions PT Visit Diagnosis: Other abnormalities of gait and mobility (R26.89);Muscle weakness (generalized) (M62.81);Difficulty in walking, not elsewhere classified (R26.2);Hemiplegia and hemiparesis Hemiplegia - Right/Left: Right Hemiplegia - dominant/non-dominant:  Dominant Hemiplegia - caused by: Cerebral infarction    Time: 1005-1049 PT Time Calculation (min) (ACUTE ONLY): 44 min   Charges:   PT Evaluation $PT Eval Low Complexity: 1 Low PT Treatments $Gait Training: 8-22 mins $Therapeutic Activity: 8-22 mins       Leitha Bleak, PT 05/26/18, 11:27 AM [phone]

## 2018-05-27 LAB — URINALYSIS, COMPLETE (UACMP) WITH MICROSCOPIC
Bacteria, UA: NONE SEEN
Bilirubin Urine: NEGATIVE
Glucose, UA: NEGATIVE mg/dL
Hgb urine dipstick: NEGATIVE
Ketones, ur: NEGATIVE mg/dL
Leukocytes, UA: NEGATIVE
Nitrite: NEGATIVE
PH: 7 (ref 5.0–8.0)
Protein, ur: NEGATIVE mg/dL
Specific Gravity, Urine: 1.016 (ref 1.005–1.030)
Squamous Epithelial / HPF: NONE SEEN (ref 0–5)

## 2018-05-27 LAB — GLUCOSE, CAPILLARY
GLUCOSE-CAPILLARY: 228 mg/dL — AB (ref 70–99)
Glucose-Capillary: 113 mg/dL — ABNORMAL HIGH (ref 70–99)
Glucose-Capillary: 188 mg/dL — ABNORMAL HIGH (ref 70–99)
Glucose-Capillary: 147 mg/dL — ABNORMAL HIGH (ref 70–99)

## 2018-05-27 MED ORDER — ASPIRIN 81 MG PO CHEW: 81.0000 mg | CHEWABLE_TABLET | Freq: Every day | ORAL | 0 refills | Status: DC

## 2018-05-27 MED ORDER — ASPIRIN 81 MG PO CHEW: 81.0000 mg | CHEWABLE_TABLET | Freq: Every day | ORAL | 0 refills | Status: AC

## 2018-05-27 MED ORDER — QUETIAPINE FUMARATE 25 MG PO TABS: 12.5000 mg | ORAL_TABLET | Freq: Every day | ORAL | 0 refills | Status: DC

## 2018-05-27 MED ORDER — STROKE: EARLY STAGES OF RECOVERY BOOK
Freq: Once | Status: AC
  Administered 2018-05-27: 05:00:00

## 2018-05-27 NOTE — Progress Notes (Addendum)
[facility] [place] NAME: Brian Barry    MR#:  [id number]  DATE OF BIRTH:  02-13-1930  SUBJECTIVE:.  Patient found to have acute stroke, physical therapy is recommending rehab.  Agitated last night, received Seroquel 25 mg, this morning he is very sedated, confused at times.  CHIEF COMPLAINT:   Chief Complaint  Patient presents with  . Weakness    REVIEW OF SYSTEMS:   ROS CONSTITUTIONAL: No fever, fatigue or weakness.  EYES: No blurred or double vision.  EARS, NOSE, AND THROAT: No tinnitus or ear pain.  RESPIRATORY: No cough, shortness of breath, wheezing or hemoptysis.  CARDIOVASCULAR: No chest pain, orthopnea, edema.  GASTROINTESTINAL: No nausea, vomiting, diarrhea or abdominal pain.  GENITOURINARY: No dysuria, hematuria.  ENDOCRINE: No polyuria, nocturia,  HEMATOLOGY: No anemia, easy bruising or bleeding SKIN: No rash or lesion. MUSCULOSKELETAL: No joint pain or arthritis.   NEUROLOGIC: No tingling, numbness, weakness.  Does have ataxia, some blurred vision.Marland Kitchen PSYCHIATRY: No anxiety or depression.   DRUG ALLERGIES:  No Known Allergies  VITALS:  Blood pressure (!) 130/59, pulse (!) 55, temperature (!) 97.4 F (36.3 C), temperature source Oral, resp. rate 17, height 5\' 8"  (1.727 m), weight 74.9 kg, SpO2 93 %.  PHYSICAL EXAMINATION:  GENERAL:  85 y.o.-year-old patient lying in the bed with no acute distress.  EYES: Pupils equal, round, reactive to light and accommodation. No scleral icterus. Extraocular muscles intact.  HEENT: Head atraumatic, normocephalic. Oropharynx and nasopharynx clear.  NECK:  Supple, no jugular venous distention. No thyroid enlargement, no tenderness.  LUNGS: Normal breath sounds bilaterally, no wheezing, rales,rhonchi or crepitation. No use of accessory muscles of respiration.  CARDIOVASCULAR: S1, S2 normal. No murmurs, rubs, or gallops.  ABDOMEN: Soft, nontender, nondistended. Bowel sounds  present. No organomegaly or mass.  EXTREMITIES: No pedal edema, cyanosis, or clubbing.  NEUROLOGIC: Cranial nerves II through XII are intact. Muscle strength 5/5 in all extremities. Sensation intact. Gait not checked.  PSYCHIATRIC: The patient is alert and oriented x 3.  SKIN: No obvious rash, lesion, or ulcer.    LABORATORY PANEL:   CBC Recent Labs  Lab 05/26/18 0455  WBC 5.3  HGB 11.1*  HCT 32.6*  PLT 184   ------------------------------------------------------------------------------------------------------------------  Chemistries  Recent Labs  Lab 05/25/18 0913 05/26/18 0455  NA 132* 135  K 4.0 3.7  CL 97* 100  CO2 26 29  GLUCOSE 88 55*  BUN 12 13  CREATININE 0.72 0.65  CALCIUM 8.7* 9.0  MG 2.0  --   AST 24  --   ALT 13  --   ALKPHOS 46  --   BILITOT 0.8  --    ------------------------------------------------------------------------------------------------------------------  Cardiac Enzymes Recent Labs  Lab 05/25/18 0913  TROPONINI <0.03   ------------------------------------------------------------------------------------------------------------------  RADIOLOGY:  Mr Brain Wo Contrast  Result Date: 05/25/2018 CLINICAL DATA:  Initial evaluation for acute dizziness, diplopia. EXAM: MRI HEAD WITHOUT CONTRAST TECHNIQUE: Multiplanar, multiecho pulse sequences of the brain and surrounding structures were obtained without intravenous contrast. COMPARISON:  Prior CT from earlier same day. FINDINGS: Brain: Diffuse prominence of the CSF containing spaces compatible generalized age-related cerebral atrophy. Patchy and confluent T2/FLAIR hyperintensity within the periventricular and deep white matter both cerebral hemispheres consistent with chronic micro vessel ischemic disease, moderate nature. Scatter remote lacunar infarcts present within the bilateral basal ganglia/corona radiata, thalami, and left paramedian pons. Remote right frontal cortical infarct with  associated encephalomalacia and gliosis noted. 7 mm acute ischemic  small vessel type infarcts seen at the left paramedian dorsal pons, involving the periaqueductal gray matter (series 2, image 20). No associated hemorrhage or mass effect. No other evidence for acute or subacute ischemia. No other acute intracranial hemorrhage. Single chronic microhemorrhage noted within the left periatrial white matter, likely small vessel related. No mass lesion, midline shift or mass effect. Diffuse ventricular prominence related to global parenchymal volume loss without hydrocephalus. No extra-axial fluid collection. Vascular: Major intracranial vascular flow voids maintained. Skull and upper cervical spine: Craniocervical junction within normal limits. Upper cervical spine normal. Bone marrow signal intensity normal. Small lipoma noted at the right frontal scalp. Sinuses/Orbits: Patient status post bilateral ocular lens replacement. Paranasal sinuses are clear. No mastoid effusion. Inner ear structures grossly normal. Other: None. IMPRESSION: 1. 7 mm acute ischemic nonhemorrhagic small vessel type infarct involving the left paramedian pons. 2. Underlying age-related cerebral atrophy with advanced chronic microvascular ischemic disease with multiple remote lacunar infarcts involving the bilateral basal ganglia/corona radiata, thalami, and pons. Additional chronic right frontal cortical infarct. Electronically Signed   By: Jeannine Boga M.D.   On: 05/25/2018 16:19   US Carotid Bilateral  Result Date: 05/25/2018 CLINICAL DATA:  TIA. EXAM: BILATERAL CAROTID DUPLEX ULTRASOUND TECHNIQUE: Pearline Cables scale imaging, color Doppler and duplex ultrasound were performed of bilateral carotid and vertebral arteries in the neck. COMPARISON:  CTA neck dated November 12, 2017. FINDINGS: Criteria: Quantification of carotid stenosis is based on velocity parameters that correlate the residual internal carotid diameter with NASCET-based stenosis  levels, using the diameter of the distal internal carotid lumen as the denominator for stenosis measurement. The following velocity measurements were obtained: RIGHT ICA: 79 cm/sec CCA: 78 cm/sec SYSTOLIC ICA/CCA RATIO:  1.0 ECA: 90 cm/sec LEFT ICA: 74 cm/sec CCA: 80 cm/sec SYSTOLIC ICA/CCA RATIO:  0.9 ECA: 94 cm/sec RIGHT CAROTID ARTERY: Mild calcified plaque at the carotid bulb and proximal ICA. RIGHT VERTEBRAL ARTERY:  Antegrade flow. LEFT CAROTID ARTERY: Mild calcified plaque at the carotid bulb and proximal ICA. LEFT VERTEBRAL ARTERY:  Antegrade flow. IMPRESSION: 1. Atherosclerosis at the bilateral carotid bifurcations without significant stenosis. Electronically Signed   By: Titus Dubin M.D.   On: 05/25/2018 15:16    EKG:   Orders placed or performed during the hospital encounter of 05/25/18  . EKG 12-Lead  . EKG 12-Lead    ASSESSMENT AND PLAN:   85 year old male patient with history of diabetes mellitus type 2, hyperlipidemia, hypertension, hypothyroidism comes in because of vertigo, difficulty walking, diplopia,   patient MRI of the brain shows 7 mm acute stroke in the left pons: Patient has absolutely no symptoms except some blurred double vision, vertigo . Continue aspirin, Plavix.  Echocardiogram showed EF more than 50%,.  Ultrasound did not show acute hemodynamically significant stenosis, physical therapy recommends rehab, family is interested in [facility].  Possible discharge tomorrow.  2/diabetes mellitus type 2, hypoglycemia evaluated, changing to Lantus, continue metformin, hold glipizide. 3.  BPH: Continue Flomax. #4. diabetes mellitus type 2:  hypoglycemia today morning, decreased dose of Lantus, can resume Lantus in the morning at  discharge  Agitation,; has been agitated in the evening, according to patient services going on for a while, and start Seroquel 12.5 mg p.o. daily instead of 25 mg.   Likely discharge to rehab when the bed is available, insurance arrangements  are made. Discussed with patient's wife, daughter.  More than 50% time spent in counseling, coordination of care.   All the records are reviewed and case  discussed with Care Management/Social Workerr. Management plans discussed with the patient, family and they are in agreement.  CODE STATUS: Full code  TOTAL TIME TAKING CARE OF THIS PATIENT: 38 minutes.   POSSIBLE D/C IN 1-2DAYS, DEPENDING ON CLINICAL CONDITION.   Epifanio Lesches M.D on 05/27/2018 at 12:23 PM  Between 7am to 6pm - Pager - [phone]  After 6pm go to [email or web address] - password [email or web address] [facility]  Office  [phone]  CC: Primary care physician; Derinda Late, MD X-ray you do not see [place]  Note: This dictation was prepared with Dragon dictation along with smaller phrase technology. Any transcriptional errors that result from this process are unintentional.

## 2018-05-27 NOTE — Progress Notes (Signed)
Physical Therapy Treatment Patient Details Name: Brian Barry MRN: [id number] DOB: 06/03/1929 Today's Date: 05/27/2018    History of Present Illness Pt is an 85 y.o. male presenting to hospital 05/25/18 with double vission, dizziness, increased lethargy, weakness, and off balance walking.  Pt admitted with 35mm acute ischemic nonhemorrhagic infarct L paramedian pons and diplopia.  PMH includes TIA's, CVA, DM, htn, PNA, pelvic fx, AAA, hip fx 2017, B THR, L TKR, R hip revision, lumbar lami, h/o R foot drop.    PT Comments    Pt appearing to fatigue more quickly with activities today.  Able to stand with CGA, use of RW, and significant cueing for technique.  Gait quality decreased and assist levels increased compared to yesterday. Pt more consistently having difficulty advancing R LE today (catching R foot on floor/R LE "dragging" behind pt); pt with increased R lateral and anterior lean with increased distance ambulating requiring min and progressively mod assist to maintain upright and prevent fall; fairly consistent vc's required to stay closer to walker and for upright posture.  Nurse notified of above including R UE/LE appearing weaker today with activity and increased assist levels required today.  D/t increased assist levels required and concerns for falls, recommend discharge to STR (SW and CM notified of updated PT recommendations).   Follow Up Recommendations  SNF     Equipment Recommendations  Rolling walker with 5" wheels    Recommendations for Other Services OT consult     Precautions / Restrictions Precautions Precautions: Fall Restrictions Weight Bearing Restrictions: No    Mobility  Bed Mobility               General bed mobility comments: Deferred (pt sitting in chair beginning/end of session)  Transfers Overall transfer level: Needs assistance Equipment used: Rolling walker (2 wheeled) Transfers: Sit to/from Stand Sit to Stand: Min guard          General transfer comment: x2 trials standing from recliner; increased time to perform and increased vc's for technique required (scooting to edge of chair, LE placement, UE placement, and shifting forward "nose over toes" to stand)  Ambulation/Gait Ambulation/Gait assistance: Min assist;Mod assist Gait Distance (Feet): (60 feet; 40 feet) Assistive device: Rolling walker (2 wheeled)   Gait velocity: decreased   General Gait Details: pt more consistently having difficulty advancing R LE today (catching R foot on floor/R LE "dragging" behind pt); pt with increased R lateral and anterior lean with increased distance ambulating requiring min and progressively mod assist to maintain upright (increased R elbow flexion noted with UE support on walker); fairly consistent vc's required to stay closer to walker and for upright posture   Stairs             Wheelchair Mobility    Modified Rankin (Stroke Patients Only)       Balance Overall balance assessment: Needs assistance Sitting-balance support: No upper extremity supported;Feet supported Sitting balance-Leahy Scale: Good Sitting balance - Comments: Steady sitting reaching within BOS   Standing balance support: No upper extremity supported Standing balance-Leahy Scale: Poor Standing balance comment: pt requiring at least single UE support for static standing balance                            Cognition Arousal/Alertness: Awake/alert Behavior During Therapy: Flat affect Overall Cognitive Status: History of cognitive impairments - at baseline  Exercises      General Comments   Nursing cleared pt for participation in physical therapy.  Pt agreeable to PT session.  Pt's wife and daughter present during session.      Pertinent Vitals/Pain Pain Assessment: No/denies pain  Vitals (HR and O2 on room air) stable and WFL throughout treatment session.    Home  Living                      Prior Function            PT Goals (current goals can now be found in the care plan section) Acute Rehab PT Goals Patient Stated Goal: to improve mobility PT Goal Formulation: With patient/family Time For Goal Achievement: 06/09/18 Potential to Achieve Goals: Fair Progress towards PT goals: Progressing toward goals    Frequency    7X/week      PT Plan Discharge plan needs to be updated    Co-evaluation              AM-PAC PT "6 Clicks" Mobility   Outcome Measure  Help needed turning from your back to your side while in a flat bed without using bedrails?: A Little Help needed moving from lying on your back to sitting on the side of a flat bed without using bedrails?: A Lot Help needed moving to and from a bed to a chair (including a wheelchair)?: A Lot Help needed standing up from a chair using your arms (e.g., wheelchair or bedside chair)?: A Little Help needed to walk in hospital room?: A Lot Help needed climbing 3-5 steps with a railing? : Total 6 Click Score: 13    End of Session Equipment Utilized During Treatment: Gait belt Activity Tolerance: Patient limited by fatigue Patient left: in chair;with call bell/phone within reach;with chair alarm set;with family/visitor present Nurse Communication: Mobility status;Precautions;Other (comment)(R sided weakness) PT Visit Diagnosis: Other abnormalities of gait and mobility (R26.89);Muscle weakness (generalized) (M62.81);Difficulty in walking, not elsewhere classified (R26.2);Hemiplegia and hemiparesis Hemiplegia - Right/Left: Right Hemiplegia - dominant/non-dominant: Dominant Hemiplegia - caused by: Cerebral infarction     Time: 3532-9924 PT Time Calculation (min) (ACUTE ONLY): 27 min  Charges:  $Gait Training: 8-22 mins $Therapeutic Activity: 8-22 mins                    Leitha Bleak, PT 05/27/18, 10:17 AM [phone]

## 2018-05-27 NOTE — Clinical Social Work Placement (Signed)
   CLINICAL SOCIAL WORK PLACEMENT  NOTE  Date:  05/27/2018  Patient Details  Name: Brian Barry MRN: [id number] Date of Birth: [birth year]  Clinical Social Work is seeking post-discharge placement for this patient at the [place] level of care (*CSW will initial, date and re-position this form in  chart as items are completed):  Yes   Patient/family provided with [facility] Work Department's list of facilities offering this level of care within the geographic area requested by the patient (or if unable, by the patient's family).  Yes   Patient/family informed of their freedom to choose among providers that offer the needed level of care, that participate in Medicare, Medicaid or managed care program needed by the patient, have an available bed and are willing to accept the patient.  Yes   Patient/family informed of [facility]'s ownership interest in [facility] and [facility], as well as of the fact that they are under no obligation to receive care at these facilities.  PASRR submitted to EDS on 05/27/18     PASRR number received on       Existing PASRR number confirmed on 05/27/18     FL2 transmitted to all facilities in geographic area requested by pt/family on 05/27/18     FL2 transmitted to all facilities within larger geographic area on 05/27/18     Patient informed that his/her managed care company has contracts with or will negotiate with certain facilities, including the following:        Yes   Patient/family informed of bed offers received.  Patient chooses bed at [facility]     Physician recommends and patient chooses bed at [facility])    Patient to be transferred to [facility] on  .  Patient to be transferred to facility by       Patient family notified on   of transfer.  Name of family member notified:        PHYSICIAN       Additional Comment:    _______________________________________________ Annamaria Boots,  [place] 05/27/2018, 3:46 PM

## 2018-05-27 NOTE — Clinical Social Work Note (Signed)
CSW received call from Amanda at [facility]). She states that patient has been approved for SNF for 3 days (1/15-1/17). Auth number is [id number] and CMG codes are: PT/OT: TN, ST: SJ, Nursing: PDE1, Non Ambulatory: NE with 651 minutes of therapy per week. Contact person for follow up authorization is Barnetta Chapel and her fax number is [phone]. CSW will notify [name] at [facility] of above. CSW will continue to follow for discharge planning.   [place], [place]

## 2018-05-27 NOTE — Clinical Social Work Note (Signed)
Clinical Social Work Assessment  Patient Details  Name: Brian Barry MRN: [id number] Date of Birth: 05/19/1929  Date of referral:  05/27/18               Reason for consult:  Facility Placement                Permission sought to share information with:  Case Manager, Customer service manager, Family Supports Permission granted to share information::  Yes, Verbal Permission Granted  Name::        Agency::     Relationship::     Contact Information:     Housing/Transportation Living arrangements for the past 2 months:  [place] of Information:  Spouse Patient Interpreter Needed:  None Criminal Activity/Legal Involvement Pertinent to Current Situation/Hospitalization:  No - Comment as needed Significant Relationships:  Spouse, Adult Children Lives with:  Spouse Do you feel safe going back to the place where you live?  Yes Need for family participation in patient care:  Yes (Comment)  Care giving concerns:  Patient lives with wife at [place] living    Social Worker assessment / plan:  CSW consulted for placement. CSW met with patient and wife Brian Barry at bedside. Wife states that she and husband live at [facility] and would like for patient to go to the rehab part of [place]. CSW explained that insurance approval would need to be obtained before he could go to SNF but that could be started today. CSW notified [name] at [facility] of referral. CSW also started [employer] authorization. CSW will continue to follow for discharge planning.   Employment status:  Retired Nurse, adult PT Recommendations:  [place] / Referral to community resources:  [place]  Patient/Family's Response to care:  Patient and wife thanked CSW for assistance.   Patient/Family's Understanding of and Emotional Response to Diagnosis, Current Treatment, and Prognosis:   Patient and wife understand current treatment plan and are in agreement with discharge plan   Emotional Assessment Appearance:  Appears stated age Attitude/Demeanor/Rapport:    Affect (typically observed):  Accepting, Pleasant Orientation:  Oriented to Self, Oriented to Place, Oriented to  Time Alcohol / Substance use:  Not Applicable Psych involvement (Current and /or in the community):  No (Comment)  Discharge Needs  Concerns to be addressed:  Discharge Planning Concerns Readmission within the last 30 days:  No Current discharge risk:  Cognitively Impaired, Dependent with Mobility Barriers to Discharge:  Continued Medical Work up   [employer], [place] 05/27/2018, 3:41 PM

## 2018-05-27 NOTE — Plan of Care (Signed)
  Problem: Education: Goal: Knowledge of General Education information will improve Description Including pain rating scale, medication(s)/side effects and non-pharmacologic comfort measures Outcome: Progressing   Problem: Health Behavior/Discharge Planning: Goal: Ability to manage health-related needs will improve Outcome: Progressing   Problem: Clinical Measurements: Goal: Ability to maintain clinical measurements within normal limits will improve Outcome: Progressing Goal: Will remain free from infection Outcome: Progressing Goal: Diagnostic test results will improve Outcome: Progressing Goal: Respiratory complications will improve Outcome: Progressing Goal: Cardiovascular complication will be avoided Outcome: Progressing   Problem: Activity: Goal: Risk for activity intolerance will decrease Outcome: Progressing   Problem: Nutrition: Goal: Adequate nutrition will be maintained Outcome: Progressing   Problem: Coping: Goal: Level of anxiety will decrease Outcome: Progressing   Problem: Elimination: Goal: Will not experience complications related to bowel motility Outcome: Progressing Goal: Will not experience complications related to urinary retention Outcome: Progressing   Problem: Pain Managment: Goal: General experience of comfort will improve Outcome: Progressing   Problem: Safety: Goal: Ability to remain free from injury will improve Outcome: Progressing   Problem: Skin Integrity: Goal: Risk for impaired skin integrity will decrease Outcome: Progressing   Problem: Education: Goal: Knowledge of patient specific risk factors addressed and post discharge goals established will improve Outcome: Progressing Goal: Individualized Educational Video(s) Outcome: Progressing   Problem: Coping: Goal: Will identify appropriate support needs Outcome: Progressing

## 2018-05-27 NOTE — NC FL2 (Signed)
[place] LEVEL OF CARE SCREENING TOOL     IDENTIFICATION  Patient Name: Brian Barry Birthdate: Aug 06, 1929 Sex: male Admission Date (Current Location): 05/25/2018  [place] and [place] Number:  Engineering geologist and Address:  [facility], [address], [place], [place] [address]      Provider Number: [id number]  Attending Physician Name and Address:  Epifanio Lesches, MD  Relative Name and Phone Number:       Current Level of Care: Hospital Recommended Level of Care: [place] Prior Approval Number:    Date Approved/Denied:   PASRR Number: [id number]  Discharge Plan: SNF    Current Diagnoses: Patient Active Problem List   Diagnosis Date Noted  . CVA (cerebral vascular accident) ([place]) 11/12/2017  . TIA (transient ischemic attack) 07/16/2016  . Confusion   . Ascending aortic aneurysm ([place]) 05/19/2016  . Hyponatremia 05/19/2016  . Generalized weakness 05/19/2016  . Sepsis due to pneumonia ([place]) 05/18/2016  . Hip fracture ([place]) 09/10/2015    Orientation RESPIRATION BLADDER Height & Weight     Self, Time, Place  Normal Continent Weight: 165 lb 1.6 oz (74.9 kg) Height:  5\' 8"  (172.7 cm)  BEHAVIORAL SYMPTOMS/MOOD NEUROLOGICAL BOWEL NUTRITION STATUS  (none) (none) Continent Diet(Heart Healthy )  AMBULATORY STATUS COMMUNICATION OF NEEDS Skin   Extensive Assist Verbally Normal                       Personal Care Assistance Level of Assistance  Bathing, Feeding, Dressing Bathing Assistance: Limited assistance Feeding assistance: Independent Dressing Assistance: Limited assistance     Functional Limitations Info  Sight, Hearing, Speech Sight Info: Adequate Hearing Info: Adequate Speech Info: Adequate    SPECIAL CARE FACTORS FREQUENCY  PT (By licensed PT), OT (By licensed OT)     PT Frequency: 5 OT Frequency: 5            Contractures Contractures Info: Not present    Additional  Factors Info  Code Status, Allergies Code Status Info: DNR Allergies Info: NKA           Current Medications (05/27/2018):  This is the current hospital active medication list Current Facility-Administered Medications  Medication Dose Route Frequency Provider Last Rate Last Dose  . acetaminophen (TYLENOL) tablet 650 mg  650 mg Oral Q6H PRN Gladstone Lighter, MD       Or  . acetaminophen (TYLENOL) suppository 650 mg  650 mg Rectal Q6H PRN Gladstone Lighter, MD      . aspirin chewable tablet 81 mg  81 mg Oral Daily Gladstone Lighter, MD   81 mg at 05/27/18 1008  . calcium-vitamin D (OSCAL WITH D) 500-200 MG-UNIT per tablet 1 tablet  1 tablet Oral BID Gladstone Lighter, MD   1 tablet at 05/27/18 1008  . clopidogrel (PLAVIX) tablet 75 mg  75 mg Oral Daily Gladstone Lighter, MD   75 mg at 05/27/18 1008  . enoxaparin (LOVENOX) injection 40 mg  40 mg Subcutaneous Q24H Gladstone Lighter, MD   40 mg at 05/26/18 2103  . finasteride (PROSCAR) tablet 5 mg  5 mg Oral Daily Gladstone Lighter, MD   5 mg at 05/27/18 1008  . gabapentin (NEURONTIN) capsule 300 mg  300 mg Oral QHS Gladstone Lighter, MD   300 mg at 05/26/18 2055  . galantamine (RAZADYNE ER) 24 hr capsule 8 mg  8 mg Oral Q breakfast Gladstone Lighter, MD   8 mg at 05/27/18 1008  .  insulin aspart (novoLOG) injection 0-5 Units  0-5 Units Subcutaneous QHS Gladstone Lighter, MD      . insulin aspart (novoLOG) injection 0-9 Units  0-9 Units Subcutaneous TID WC Gladstone Lighter, MD   1 Units at 05/26/18 1704  . insulin glargine (LANTUS) injection 15 Units  15 Units Subcutaneous QHS Epifanio Lesches, MD      . levothyroxine (SYNTHROID, LEVOTHROID) tablet 25 mcg  25 mcg Oral QHS Gladstone Lighter, MD   25 mcg at 05/26/18 2055  . lisinopril (PRINIVIL,ZESTRIL) tablet 10 mg  10 mg Oral Daily Gladstone Lighter, MD   10 mg at 05/27/18 1008  . metFORMIN (GLUCOPHAGE) tablet 1,000 mg  1,000 mg Oral BID WC Gladstone Lighter, MD   1,000 mg at  05/27/18 1008  . ondansetron (ZOFRAN) tablet 4 mg  4 mg Oral Q6H PRN Gladstone Lighter, MD       Or  . ondansetron (ZOFRAN) injection 4 mg  4 mg Intravenous Q6H PRN Gladstone Lighter, MD      . polyethylene glycol (MIRALAX / GLYCOLAX) packet 17 g  17 g Oral Daily PRN Gladstone Lighter, MD      . senna-docusate (Senokot-S) tablet 1 tablet  1 tablet Oral QHS PRN Gladstone Lighter, MD      . simvastatin (ZOCOR) tablet 20 mg  20 mg Oral QPM Gladstone Lighter, MD   20 mg at 05/26/18 1705  . tamsulosin (FLOMAX) capsule 0.4 mg  0.4 mg Oral BID Gladstone Lighter, MD   0.4 mg at 05/27/18 1008  . vitamin B-12 (CYANOCOBALAMIN) tablet 1,000 mcg  1,000 mcg Oral Daily Gladstone Lighter, MD   1,000 mcg at 05/27/18 1007     Discharge Medications: Please see discharge summary for a list of discharge medications.  Relevant Imaging Results:  Relevant Lab Results:   Additional Information SSN: [id number]  Annamaria Boots, [place]

## 2018-05-27 NOTE — Progress Notes (Signed)
  Speech Language Pathology Treatment: Dysphagia  Patient Details Name: Brian Brian Barry Brian Barry MRN: [id number] DOB: [birth year] Today's Date: 05/27/2018 Time: 4098-1191 SLP Time Calculation (min) (ACUTE ONLY): 20 min  Assessment / Plan / Recommendation Clinical Impression  Pt seen for ongoing assessment of pt's toleration of recommended diet; education w/ both pt and family. Pt was sitting in chair at bedside feeding self his breakfast meal. Noted pt to exhibit limited verbal engagement feeding himself bites/sips more methodically.  Pt consumed po's at his breakfast meal w/ no overt s/s of aspiration noted; no decline in vocal quality and no decline in respiratory status during/post po intake. Oral phase appeared [facility] [place] during bolus acceptance, mastication, and oral clearing. Education given to pt/family on general aspiration precautions; food consistencies; tips for swallowing pills and not using straws if needed(if any deficits of increased coughing or throat clearing noted). Answered questions; handout given. No further skilled ST services indicated at this time; NSG to reconsult if any decline in status while admitted. Pt/family agreed. NSG updated.      HPI HPI: Pt is a 85 y.o. male with a known history of prior history of strokes, hypertension, hyperlipidemia, arthritis, diabetes, BPH who is from [facility] independent facility presents to hospital secondary to worsening dizziness and weakness.  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      SLP Plan  All goals met       Recommendations  Diet recommendations: Regular;Thin liquid(w/ cut meats) Liquids provided via: Cup(monitor any straw use) Medication Administration: Whole meds with liquid(may use applesauce IF easier for swallowing) Supervision: Patient able to self feed Compensations: Minimize environmental distractions;Slow  rate;Small sips/bites Postural Changes and/or Swallow Maneuvers: Seated upright 90 degrees;Upright 30-60 min after meal                General recommendations: (Dietician f/u as needed) Oral Care Recommendations: Oral care BID;Patient independent with oral care(supervision) Follow up Recommendations: None SLP Visit Diagnosis: Dysphagia, unspecified (R13.10) Plan: All goals met       GO                Brian Kenner, MS, CCC-SLP Brian Brian Barry 05/27/2018, 11:47 AM

## 2018-05-28 LAB — GLUCOSE, CAPILLARY
Glucose-Capillary: 125 mg/dL — ABNORMAL HIGH (ref 70–99)
Glucose-Capillary: 107 mg/dL — ABNORMAL HIGH (ref 70–99)

## 2018-05-28 NOTE — Progress Notes (Signed)
Patient having difficulty urinating. Bladder scan performed. Patient had 524 ml in bladder. Nurse tech assisted patient to bathroom and patient voided 282 ml. Patient had a post-void residual of 242 ml. Patient takes Flomax as a part of his medication regimen at baseline. MD notified. No new orders at this time. Will continue to monitor.

## 2018-05-28 NOTE — Progress Notes (Signed)
Physical Therapy Treatment Patient Details Name: Brian Barry MRN: [id number] DOB: 07/24/1929 Today's Date: 05/28/2018    History of Present Illness Pt is an 85 y.o. male presenting to hospital 05/25/18 with double vission, dizziness, increased lethargy, weakness, and off balance walking.  Pt admitted with 8mm acute ischemic nonhemorrhagic infarct L paramedian pons and diplopia.  PMH includes TIA's, CVA, DM, htn, PNA, pelvic fx, AAA, hip fx 2017, B THR, L TKR, R hip revision, lumbar lami, h/o R foot drop.    PT Comments    Pt in bed,  Agrees to therapy with family encouragement.  To edge of bed with min gaud.  Right lean noted in sitting.  Verbal cues to correct.  Stood with max vc.  While standing and once up, she demonstrated moderate right and forward lean requiring mod assist to prevent fall.  Session focused on standing and sitting balance and activities in place.  He was able to take a few side steps and along bed but generally unsafe.  Family stated he was able to walk to bathroom with nursing during the night with +1 assist when he had to urinate.  Pt generally fatigued and flat affect but did want to get up to recliner after session.  Discussed with primary nurse tech regarding assist level and need for +2 for standing with urinal to to get to the bathroom if he is not more motivated and engaged in session for general pt and staff safety.   Follow Up Recommendations  SNF     Equipment Recommendations  Rolling walker with 5" wheels    Recommendations for Other Services       Precautions / Restrictions Precautions Precautions: Fall Restrictions Weight Bearing Restrictions: No    Mobility  Bed Mobility Overal bed mobility: Needs Assistance Bed Mobility: Supine to Sit;Sit to Supine     Supine to sit: HOB elevated;Min guard;Min assist        Transfers Overall transfer level: Needs assistance Equipment used: Rolling walker (2 wheeled) Transfers: Sit to/from Stand Sit  to Stand: Min assist;Mod assist Stand pivot transfers: Mod assist          Ambulation/Gait Ambulation/Gait assistance: Mod assist Gait Distance (Feet): 3 Feet Assistive device: Rolling walker (2 wheeled) Gait Pattern/deviations: Step-to pattern Gait velocity: decreased   General Gait Details: Leaning rigth and forward with standing and attempts at gait.  Generally unsafe to attempt further than bedside without +2 assist.   Stairs             Wheelchair Mobility    Modified Rankin (Stroke Patients Only)       Balance Overall balance assessment: Needs assistance Sitting-balance support: No upper extremity supported;Feet supported Sitting balance-Leahy Scale: Fair Sitting balance - Comments: leaning right with verbal cues to correct.   Standing balance support: Bilateral upper extremity supported Standing balance-Leahy Scale: Poor Standing balance comment: heavy lean to rigth and forward despite BUE support and cues.                            Cognition Arousal/Alertness: Awake/alert Behavior During Therapy: Flat affect Overall Cognitive Status: History of cognitive impairments - at baseline                                        Exercises Other Exercises Other Exercises: marching in place and SLR in  standing x 10.    General Comments        Pertinent Vitals/Pain Pain Assessment: No/denies pain    Home Living                      Prior Function            PT Goals (current goals can now be found in the care plan section) Progress towards PT goals: Progressing toward goals    Frequency    7X/week      PT Plan Current plan remains appropriate    Co-evaluation              AM-PAC PT "6 Clicks" Mobility   Outcome Measure  Help needed turning from your back to your side while in a flat bed without using bedrails?: A Little Help needed moving from lying on your back to sitting on the side of a flat  bed without using bedrails?: A Little Help needed moving to and from a bed to a chair (including a wheelchair)?: A Lot Help needed standing up from a chair using your arms (e.g., wheelchair or bedside chair)?: A Lot Help needed to walk in hospital room?: A Lot Help needed climbing 3-5 steps with a railing? : Total 6 Click Score: 13    End of Session Equipment Utilized During Treatment: Gait belt Activity Tolerance: Patient limited by fatigue Patient left: in chair;with call bell/phone within reach;with chair alarm set;with family/visitor present Nurse Communication: Mobility status Hemiplegia - Right/Left: Right Hemiplegia - dominant/non-dominant: Dominant Hemiplegia - caused by: Cerebral infarction     Time: 7829-5621 PT Time Calculation (min) (ACUTE ONLY): 18 min  Charges:  $Therapeutic Exercise: 8-22 mins                     Chesley Noon, PTA 05/28/18, 11:27 AM

## 2018-05-28 NOTE — Clinical Social Work Note (Signed)
Patient is medically ready for discharge today. CSW notified patient's wife and daughter at bedside of discharge today. CSW also notified [name] at [facility] of discharge today. Patient will be transported by EMS. RN to call report and call for transport.   [place], [place]

## 2018-05-28 NOTE — Plan of Care (Signed)
  Problem: Education: Goal: Knowledge of General Education information will improve Description Including pain rating scale, medication(s)/side effects and non-pharmacologic comfort measures Outcome: Progressing   Problem: Health Behavior/Discharge Planning: Goal: Ability to manage health-related needs will improve Outcome: Progressing   Problem: Clinical Measurements: Goal: Ability to maintain clinical measurements within normal limits will improve Outcome: Progressing Goal: Will remain free from infection Outcome: Progressing Goal: Diagnostic test results will improve Outcome: Progressing Goal: Respiratory complications will improve Outcome: Progressing Goal: Cardiovascular complication will be avoided Outcome: Progressing   Problem: Activity: Goal: Risk for activity intolerance will decrease Outcome: Progressing   Problem: Nutrition: Goal: Adequate nutrition will be maintained Outcome: Progressing   Problem: Coping: Goal: Level of anxiety will decrease Outcome: Progressing   Problem: Elimination: Goal: Will not experience complications related to bowel motility Outcome: Progressing Goal: Will not experience complications related to urinary retention Outcome: Progressing   Problem: Pain Managment: Goal: General experience of comfort will improve Outcome: Progressing   Problem: Safety: Goal: Ability to remain free from injury will improve Outcome: Progressing   Problem: Skin Integrity: Goal: Risk for impaired skin integrity will decrease Outcome: Progressing   Problem: Education: Goal: Knowledge of patient specific risk factors addressed and post discharge goals established will improve Outcome: Progressing Goal: Individualized Educational Video(s) Outcome: Progressing   Problem: Coping: Goal: Will identify appropriate support needs Outcome: Progressing

## 2018-05-28 NOTE — Progress Notes (Signed)
EMS contacted for transport.  [name], BSN 

## 2018-05-28 NOTE — Progress Notes (Signed)
Report called to [place] at [facility].  Awaiting transport.  Daughter at bedside.  Clarise Cruz, BSN

## 2018-05-28 NOTE — Care Management Important Message (Signed)
Important Message  Patient Details  Name: AKUL LEGGETTE MRN: [id number] Date of Birth: 07-Apr-1930   Medicare Important Message Given:  Yes    Juliann Pulse A Naman Spychalski 05/28/2018, 10:48 AM

## 2018-05-28 NOTE — Discharge Summary (Signed)
Brian Barry, is a 85 y.o. male  DOB 12-05-1929  MRN [id number].  Admission date:  05/25/2018  Admitting Physician  Gladstone Lighter, MD  Discharge Date:  05/28/2018   Primary MD  Derinda Late, MD  Recommendations for primary care physician for things to follow:   Follow-up with PCP in 1 week Follow-up with neurology Dr. Cleatrice Burke as scheduled.   Admission Diagnosis  TIA (transient ischemic attack) [G45.9] Weakness [R53.1]   Discharge Diagnosis  TIA (transient ischemic attack) [G45.9] Weakness [R53.1]    Active Problems:   * No active hospital problems. *      Past Medical History:  Diagnosis Date  . Arthritis   . BPH (benign prostatic hyperplasia)   . DM (diabetes mellitus) ([place])   . Hyperlipidemia   . Hypertension   . Hypothyroidism   . Osteoporosis   . Pelvic fracture ([place])   . TIA (transient ischemic attack) 1998, 2014    Past Surgical History:  Procedure Laterality Date  . bilateral hip replacement Bilateral 1998  . Left knee replacement Left 2007  . LUMBAR LAMINECTOMY  2010  . right hip revision Right 2008       History of present illness and  Hospital Course:     Kindly see H&P for history of present illness and admission details, please review complete Labs, Consult reports and Test reports for all details in brief  HPI  from the history and physical done on the day of admission 85 year old male patient with history of CVA, diabetes mellitus type 2, AAA, history of previous hip fracture comes in because of increased weakness, double vision, difficulty walking, admitted for evaluation of TIA, CVA.   Hospital Course  : Acute thrombotic stroke, patient had difficulty with vision, difficulty walking, MRI of the brain showed 7 mm acute stroke pons.  Seen by physical therapy, speech  therapy, neurology, rec physical therapy recommends rehab, patient will go to the legs today.  Patient echocardiogram showed EF more than 50%, current ultrasound did not show any hemodynamically significant stenosis.  Patient is on aspirin, Plavix, statins, continue them. 2.  Diabetes mellitus type 2, patient had episode of hypoglycemia that resolved, patient can continue Lantus that he was taking, usually patient takes Lantus in the morning, continue metformin, can resume glipizide at discharge.  Because of hypoglycemia his glipizide was held here. 3.  BPH: Continue Flomax, Proscar. 4.  Episode of agitation, received Seroquel but family is refusing further dose of Seroquel, can use melatonin at night for insomnia.     Discharge Condition: Stable   Follow UP  Follow-up Information    Derinda Late, MD. Schedule an appointment as soon as possible for a visit in 1 week(s).   Specialty:  Family Medicine Contact information: [address] [facility] [place] [address] [phone]        Vladimir Crofts, MD. Schedule an appointment as soon as possible for a visit in 2 week(s).   Specialty:  Neurology Contact information: [address] [facility] [place] [phone] [phone]             Discharge Instructions  and  Discharge Medications      Allergies as of 05/28/2018   No Known Allergies     Medication List    TAKE these medications   aspirin 81 MG chewable tablet Chew 1 tablet (81 mg total) by mouth daily.   Calcium Carbonate-Vitamin D  600-400 MG-UNIT tablet Take 1 tablet by mouth 2 (two) times daily.   clopidogrel 75 MG tablet Commonly known as:  PLAVIX Take 75 mg by mouth daily.   finasteride 5 MG tablet Commonly known as:  PROSCAR Take 5 mg by mouth daily.   gabapentin 300 MG capsule Commonly known as:  NEURONTIN Take 300 mg by mouth at bedtime.   galantamine 8 MG 24 hr  capsule Commonly known as:  RAZADYNE ER Take 8 mg by mouth daily with breakfast.   glipiZIDE 5 MG tablet Commonly known as:  GLUCOTROL Take 5 mg by mouth daily before breakfast.   insulin aspart 100 UNIT/ML FlexPen Commonly known as:  NOVOLOG Inject 5 Units into the skin 3 (three) times daily with meals.   LANTUS SOLOSTAR 100 UNIT/ML Solostar Pen Generic drug:  Insulin Glargine Inject 20 Units into the skin daily.   levothyroxine 25 MCG tablet Commonly known as:  SYNTHROID, LEVOTHROID Take 25 mcg by mouth at bedtime.   lisinopril 10 MG tablet Commonly known as:  PRINIVIL,ZESTRIL Take 10 mg by mouth daily.   metFORMIN 500 MG tablet Commonly known as:  GLUCOPHAGE Take 1,000 mg by mouth 2 (two) times daily.   polyethylene glycol packet Commonly known as:  MIRALAX / GLYCOLAX Take 17 g by mouth daily. What changed:    when to take this  reasons to take this   PROLIA 60 MG/ML Sosy injection Generic drug:  denosumab Inject 60 mg into the skin every 6 (six) months.   senna-docusate 8.6-50 MG tablet Commonly known as:  Senokot-S Take 1 tablet by mouth at bedtime as needed for mild constipation.   simvastatin 20 MG tablet Commonly known as:  ZOCOR Take 20 mg by mouth every evening.   tamsulosin 0.4 MG Caps capsule Commonly known as:  FLOMAX Take 0.4 mg by mouth 2 (two) times daily.   vitamin B-12 1000 MCG tablet Commonly known as:  CYANOCOBALAMIN Take 1,000 mcg daily by mouth.            Durable Medical Equipment  (From admission, onward)         Start     Ordered   05/27/18 1131  For home use only DME Walker rolling  Once    Question:  Patient needs a walker to treat with the following condition  Answer:  Weakness   05/27/18 1131            Diet and Activity recommendation: See Discharge Instructions above   Consults obtained -neurology, physical therapy   Major procedures and Radiology Reports - PLEASE review detailed and final reports for  all details, in brief -     Ct Head Wo Contrast  Result Date: 05/25/2018 CLINICAL DATA:  Patient with dizziness. EXAM: CT HEAD WITHOUT CONTRAST TECHNIQUE: Contiguous axial images were obtained from the base of the skull through the vertex without intravenous contrast. COMPARISON:  Brain CT 02/12/2018 FINDINGS: Brain: Ventricles and sulci are prominent compatible with atrophy. Chronic bilateral basal ganglia lacunar infarcts. Bilateral periventricular and subcortical white matter hypodensities most compatible with chronic microvascular ischemic changes. No evidence for acute cortically based infarct, intracranial hemorrhage, mass lesion or mass-effect. Chronic left midbrain infarct. Old right frontal lobe infarct. Old right thalamic infarct. Vascular: Unremarkable Skull: Intact. Sinuses/Orbits: Paranasal sinuses are well aerated. Mastoid air cells are unremarkable. Orbits unremarkable. Other: None. IMPRESSION: No acute intracranial process. Atrophy and chronic microvascular ischemic changes. Electronically Signed   By: Lovey Newcomer M.D.   On: 05/25/2018  10:09   Mr Brain Wo Contrast  Result Date: 05/25/2018 CLINICAL DATA:  Initial evaluation for acute dizziness, diplopia. EXAM: MRI HEAD WITHOUT CONTRAST TECHNIQUE: Multiplanar, multiecho pulse sequences of the brain and surrounding structures were obtained without intravenous contrast. COMPARISON:  Prior CT from earlier same day. FINDINGS: Brain: Diffuse prominence of the CSF containing spaces compatible generalized age-related cerebral atrophy. Patchy and confluent T2/FLAIR hyperintensity within the periventricular and deep white matter both cerebral hemispheres consistent with chronic micro vessel ischemic disease, moderate nature. Scatter remote lacunar infarcts present within the bilateral basal ganglia/corona radiata, thalami, and left paramedian pons. Remote right frontal cortical infarct with associated encephalomalacia and gliosis noted. 7 mm acute  ischemic small vessel type infarcts seen at the left paramedian dorsal pons, involving the periaqueductal gray matter (series 2, image 20). No associated hemorrhage or mass effect. No other evidence for acute or subacute ischemia. No other acute intracranial hemorrhage. Single chronic microhemorrhage noted within the left periatrial white matter, likely small vessel related. No mass lesion, midline shift or mass effect. Diffuse ventricular prominence related to global parenchymal volume loss without hydrocephalus. No extra-axial fluid collection. Vascular: Major intracranial vascular flow voids maintained. Skull and upper cervical spine: Craniocervical junction within normal limits. Upper cervical spine normal. Bone marrow signal intensity normal. Small lipoma noted at the right frontal scalp. Sinuses/Orbits: Patient status post bilateral ocular lens replacement. Paranasal sinuses are clear. No mastoid effusion. Inner ear structures grossly normal. Other: None. IMPRESSION: 1. 7 mm acute ischemic nonhemorrhagic small vessel type infarct involving the left paramedian pons. 2. Underlying age-related cerebral atrophy with advanced chronic microvascular ischemic disease with multiple remote lacunar infarcts involving the bilateral basal ganglia/corona radiata, thalami, and pons. Additional chronic right frontal cortical infarct. Electronically Signed   By: Jeannine Boga M.D.   On: 05/25/2018 16:19   US Carotid Bilateral  Result Date: 05/25/2018 CLINICAL DATA:  TIA. EXAM: BILATERAL CAROTID DUPLEX ULTRASOUND TECHNIQUE: Pearline Cables scale imaging, color Doppler and duplex ultrasound were performed of bilateral carotid and vertebral arteries in the neck. COMPARISON:  CTA neck dated November 12, 2017. FINDINGS: Criteria: Quantification of carotid stenosis is based on velocity parameters that correlate the residual internal carotid diameter with NASCET-based stenosis levels, using the diameter of the distal internal carotid  lumen as the denominator for stenosis measurement. The following velocity measurements were obtained: RIGHT ICA: 79 cm/sec CCA: 78 cm/sec SYSTOLIC ICA/CCA RATIO:  1.0 ECA: 90 cm/sec LEFT ICA: 74 cm/sec CCA: 80 cm/sec SYSTOLIC ICA/CCA RATIO:  0.9 ECA: 94 cm/sec RIGHT CAROTID ARTERY: Mild calcified plaque at the carotid bulb and proximal ICA. RIGHT VERTEBRAL ARTERY:  Antegrade flow. LEFT CAROTID ARTERY: Mild calcified plaque at the carotid bulb and proximal ICA. LEFT VERTEBRAL ARTERY:  Antegrade flow. IMPRESSION: 1. Atherosclerosis at the bilateral carotid bifurcations without significant stenosis. Electronically Signed   By: Titus Dubin M.D.   On: 05/25/2018 15:16   Dg Chest Port 1 View  Result Date: 05/25/2018 CLINICAL DATA:  Patient with generalized weakness. EXAM: PORTABLE CHEST 1 VIEW COMPARISON:  Chest radiograph 03/29/2017 FINDINGS: Monitoring leads overlie the patient. Stable cardiomegaly. Tortuosity of the thoracic aorta. No consolidative pulmonary opacities. No pleural effusion or pneumothorax. IMPRESSION: No acute cardiopulmonary process. Electronically Signed   By: Lovey Newcomer M.D.   On: 05/25/2018 10:11    Micro Results     No results found for this or any previous visit (from the past 240 hour(s)).     Today   Subjective:   Barrie Dunker stable  for discharge to rehab. Objective:   Blood pressure (!) 145/60, pulse 61, temperature 98.2 F (36.8 C), temperature source Oral, resp. rate 16, height 5\' 8"  (1.727 m), weight 74.9 kg, SpO2 93 %.   Intake/Output Summary (Last 24 hours) at 05/28/2018 1004 Last data filed at 05/27/2018 2100 Gross per 24 hour  Intake 720 ml  Output 402 ml  Net 318 ml    Exam Awake Alert, Oriented x 3, No new F.N deficits, Normal affect [place].AT,PERRAL Supple Neck,No JVD, No cervical lymphadenopathy appriciated.  Symmetrical Chest wall movement, Good air movement bilaterally, CTAB RRR,No Gallops,Rubs or new Murmurs, No Parasternal Heave +ve  B.Sounds, Abd Soft, Non tender, No organomegaly appriciated, No rebound -guarding or rigidity. No Cyanosis, Clubbing or edema, No new Rash or bruise  Data Review   CBC w Diff:  Lab Results  Component Value Date   WBC 5.3 05/26/2018   HGB 11.1 (L) 05/26/2018   HGB 12.7 (L) 09/20/2011   HCT 32.6 (L) 05/26/2018   HCT 37.0 (L) 09/20/2011   PLT 184 05/26/2018   PLT 159 09/20/2011   LYMPHOPCT 16 05/25/2018   MONOPCT 12 05/25/2018   EOSPCT 4 05/25/2018   BASOPCT 1 05/25/2018    CMP:  Lab Results  Component Value Date   NA 135 05/26/2018   NA 136 09/20/2011   K 3.7 05/26/2018   K 5.0 09/20/2011   CL 100 05/26/2018   CL 99 09/20/2011   CO2 29 05/26/2018   CO2 31 09/20/2011   BUN 13 05/26/2018   BUN 15 09/20/2011   CREATININE 0.65 05/26/2018   CREATININE 0.77 09/20/2011   PROT 6.3 (L) 05/25/2018   ALBUMIN 3.9 05/25/2018   BILITOT 0.8 05/25/2018   ALKPHOS 46 05/25/2018   AST 24 05/25/2018   ALT 13 05/25/2018  .   Total Time in preparing paper work, data evaluation and todays exam - 35 minutes  Epifanio Lesches M.D on 05/28/2018 at 10:04 AM    Note: This dictation was prepared with Dragon dictation along with smaller phrase technology. Any transcriptional errors that result from this process are unintentional.

## 2018-05-29 DIAGNOSIS — G301 Alzheimer's disease with late onset: Secondary | ICD-10-CM | POA: Diagnosis not present

## 2018-05-29 DIAGNOSIS — Z8673 Personal history of transient ischemic attack (TIA), and cerebral infarction without residual deficits: Secondary | ICD-10-CM

## 2018-05-29 DIAGNOSIS — F0150 Vascular dementia without behavioral disturbance: Secondary | ICD-10-CM | POA: Diagnosis not present

## 2018-05-29 DIAGNOSIS — F05 Delirium due to known physiological condition: Secondary | ICD-10-CM

## 2018-05-29 DIAGNOSIS — I10 Essential (primary) hypertension: Secondary | ICD-10-CM

## 2018-05-29 DIAGNOSIS — E1142 Type 2 diabetes mellitus with diabetic polyneuropathy: Secondary | ICD-10-CM | POA: Diagnosis not present

## 2018-10-24 ENCOUNTER — Emergency Department (HOSPITAL_COMMUNITY): Payer: Medicare HMO

## 2018-10-24 ENCOUNTER — Inpatient Hospital Stay (HOSPITAL_COMMUNITY)
Admission: EM | Admit: 2018-10-24 | Discharge: 2018-11-12 | DRG: 083 | Disposition: E | Payer: Medicare HMO | Source: Skilled Nursing Facility | Attending: General Surgery | Admitting: General Surgery

## 2018-10-24 ENCOUNTER — Other Ambulatory Visit: Payer: Self-pay

## 2018-10-24 DIAGNOSIS — E785 Hyperlipidemia, unspecified: Secondary | ICD-10-CM | POA: Diagnosis present

## 2018-10-24 DIAGNOSIS — Z79899 Other long term (current) drug therapy: Secondary | ICD-10-CM | POA: Diagnosis not present

## 2018-10-24 DIAGNOSIS — Z7982 Long term (current) use of aspirin: Secondary | ICD-10-CM | POA: Diagnosis not present

## 2018-10-24 DIAGNOSIS — N390 Urinary tract infection, site not specified: Secondary | ICD-10-CM | POA: Diagnosis present

## 2018-10-24 DIAGNOSIS — E039 Hypothyroidism, unspecified: Secondary | ICD-10-CM | POA: Diagnosis present

## 2018-10-24 DIAGNOSIS — W19XXXS Unspecified fall, sequela: Secondary | ICD-10-CM

## 2018-10-24 DIAGNOSIS — G309 Alzheimer's disease, unspecified: Secondary | ICD-10-CM | POA: Diagnosis present

## 2018-10-24 DIAGNOSIS — R4701 Aphasia: Secondary | ICD-10-CM | POA: Diagnosis present

## 2018-10-24 DIAGNOSIS — Z79890 Hormone replacement therapy (postmenopausal): Secondary | ICD-10-CM | POA: Diagnosis not present

## 2018-10-24 DIAGNOSIS — Y92009 Unspecified place in unspecified non-institutional (private) residence as the place of occurrence of the external cause: Secondary | ICD-10-CM

## 2018-10-24 DIAGNOSIS — S065XAA Traumatic subdural hemorrhage with loss of consciousness status unknown, initial encounter: Secondary | ICD-10-CM

## 2018-10-24 DIAGNOSIS — R0600 Dyspnea, unspecified: Secondary | ICD-10-CM | POA: Diagnosis not present

## 2018-10-24 DIAGNOSIS — Z7902 Long term (current) use of antithrombotics/antiplatelets: Secondary | ICD-10-CM

## 2018-10-24 DIAGNOSIS — W19XXXA Unspecified fall, initial encounter: Secondary | ICD-10-CM

## 2018-10-24 DIAGNOSIS — M810 Age-related osteoporosis without current pathological fracture: Secondary | ICD-10-CM | POA: Diagnosis present

## 2018-10-24 DIAGNOSIS — E871 Hypo-osmolality and hyponatremia: Secondary | ICD-10-CM | POA: Diagnosis present

## 2018-10-24 DIAGNOSIS — Z96652 Presence of left artificial knee joint: Secondary | ICD-10-CM | POA: Diagnosis present

## 2018-10-24 DIAGNOSIS — K117 Disturbances of salivary secretion: Secondary | ICD-10-CM

## 2018-10-24 DIAGNOSIS — Z7189 Other specified counseling: Secondary | ICD-10-CM | POA: Diagnosis not present

## 2018-10-24 DIAGNOSIS — E876 Hypokalemia: Secondary | ICD-10-CM | POA: Diagnosis not present

## 2018-10-24 DIAGNOSIS — Y93E1 Activity, personal bathing and showering: Secondary | ICD-10-CM

## 2018-10-24 DIAGNOSIS — W182XXA Fall in (into) shower or empty bathtub, initial encounter: Secondary | ICD-10-CM | POA: Diagnosis present

## 2018-10-24 DIAGNOSIS — Z20828 Contact with and (suspected) exposure to other viral communicable diseases: Secondary | ICD-10-CM | POA: Diagnosis present

## 2018-10-24 DIAGNOSIS — F0280 Dementia in other diseases classified elsewhere without behavioral disturbance: Secondary | ICD-10-CM | POA: Diagnosis present

## 2018-10-24 DIAGNOSIS — R569 Unspecified convulsions: Secondary | ICD-10-CM | POA: Diagnosis not present

## 2018-10-24 DIAGNOSIS — Z823 Family history of stroke: Secondary | ICD-10-CM

## 2018-10-24 DIAGNOSIS — Z96643 Presence of artificial hip joint, bilateral: Secondary | ICD-10-CM | POA: Diagnosis present

## 2018-10-24 DIAGNOSIS — Z515 Encounter for palliative care: Secondary | ICD-10-CM | POA: Diagnosis not present

## 2018-10-24 DIAGNOSIS — R451 Restlessness and agitation: Secondary | ICD-10-CM | POA: Diagnosis present

## 2018-10-24 DIAGNOSIS — S065X9A Traumatic subdural hemorrhage with loss of consciousness of unspecified duration, initial encounter: Secondary | ICD-10-CM | POA: Diagnosis present

## 2018-10-24 DIAGNOSIS — R55 Syncope and collapse: Secondary | ICD-10-CM | POA: Diagnosis present

## 2018-10-24 DIAGNOSIS — W19XXXD Unspecified fall, subsequent encounter: Secondary | ICD-10-CM | POA: Diagnosis not present

## 2018-10-24 DIAGNOSIS — Z66 Do not resuscitate: Secondary | ICD-10-CM | POA: Diagnosis present

## 2018-10-24 DIAGNOSIS — I10 Essential (primary) hypertension: Secondary | ICD-10-CM | POA: Diagnosis present

## 2018-10-24 DIAGNOSIS — I69398 Other sequelae of cerebral infarction: Secondary | ICD-10-CM

## 2018-10-24 DIAGNOSIS — E119 Type 2 diabetes mellitus without complications: Secondary | ICD-10-CM | POA: Diagnosis present

## 2018-10-24 DIAGNOSIS — Z794 Long term (current) use of insulin: Secondary | ICD-10-CM

## 2018-10-24 DIAGNOSIS — N400 Enlarged prostate without lower urinary tract symptoms: Secondary | ICD-10-CM | POA: Diagnosis present

## 2018-10-24 LAB — DIFFERENTIAL
Abs Immature Granulocytes: 0.03 10*3/uL (ref 0.00–0.07)
Basophils Absolute: 0 10*3/uL (ref 0.0–0.1)
Basophils Relative: 1 %
Eosinophils Absolute: 0.1 10*3/uL (ref 0.0–0.5)
Eosinophils Relative: 2 %
Immature Granulocytes: 1 %
Lymphocytes Relative: 18 %
Lymphs Abs: 1.1 10*3/uL (ref 0.7–4.0)
Monocytes Absolute: 0.7 10*3/uL (ref 0.1–1.0)
Monocytes Relative: 10 %
Neutro Abs: 4.3 10*3/uL (ref 1.7–7.7)
Neutrophils Relative %: 68 %

## 2018-10-24 LAB — CBC
HCT: 36.8 % — ABNORMAL LOW (ref 39.0–52.0)
Hemoglobin: 12.3 g/dL — ABNORMAL LOW (ref 13.0–17.0)
MCH: 32 pg (ref 26.0–34.0)
MCHC: 33.4 g/dL (ref 30.0–36.0)
MCV: 95.8 fL (ref 80.0–100.0)
Platelets: 192 10*3/uL (ref 150–400)
RBC: 3.84 MIL/uL — ABNORMAL LOW (ref 4.22–5.81)
RDW: 12.1 % (ref 11.5–15.5)
WBC: 6.3 10*3/uL (ref 4.0–10.5)
nRBC: 0 % (ref 0.0–0.2)

## 2018-10-24 LAB — COMPREHENSIVE METABOLIC PANEL
ALT: 14 U/L (ref 0–44)
AST: 29 U/L (ref 15–41)
Albumin: 4.3 g/dL (ref 3.5–5.0)
Alkaline Phosphatase: 50 U/L (ref 38–126)
Anion gap: 13 (ref 5–15)
BUN: 11 mg/dL (ref 8–23)
CO2: 25 mmol/L (ref 22–32)
Calcium: 9.4 mg/dL (ref 8.9–10.3)
Chloride: 94 mmol/L — ABNORMAL LOW (ref 98–111)
Creatinine, Ser: 0.92 mg/dL (ref 0.61–1.24)
GFR calc Af Amer: >60 mL/min (ref >60)
GFR calc non Af Amer: >60 mL/min (ref >60)
Glucose, Bld: 160 mg/dL — ABNORMAL HIGH (ref 70–99)
Potassium: 4.1 mmol/L (ref 3.5–5.1)
Sodium: 132 mmol/L — ABNORMAL LOW (ref 135–145)
Total Bilirubin: 0.5 mg/dL (ref 0.3–1.2)
Total Protein: 6.8 g/dL (ref 6.5–8.1)

## 2018-10-24 LAB — I-STAT CHEM 8, ED
BUN: 12 mg/dL (ref 8–23)
Calcium, Ion: 1.13 mmol/L — ABNORMAL LOW (ref 1.15–1.40)
Chloride: 93 mmol/L — ABNORMAL LOW (ref 98–111)
Creatinine, Ser: 0.7 mg/dL (ref 0.61–1.24)
Glucose, Bld: 156 mg/dL — ABNORMAL HIGH (ref 70–99)
HCT: 38 % — ABNORMAL LOW (ref 39.0–52.0)
Hemoglobin: 12.9 g/dL — ABNORMAL LOW (ref 13.0–17.0)
Potassium: 4.1 mmol/L (ref 3.5–5.1)
Sodium: 131 mmol/L — ABNORMAL LOW (ref 135–145)
TCO2: 28 mmol/L (ref 22–32)

## 2018-10-24 LAB — APTT: aPTT: 32 seconds (ref 24–36)

## 2018-10-24 LAB — PROTIME-INR
INR: 1.1 (ref 0.8–1.2)
Prothrombin Time: 13.6 seconds (ref 11.4–15.2)

## 2018-10-24 LAB — CBG MONITORING, ED: Glucose-Capillary: 287 mg/dL — ABNORMAL HIGH (ref 70–99)

## 2018-10-24 LAB — ETHANOL: Alcohol, Ethyl (B): <10 mg/dL (ref <10)

## 2018-10-24 MED ORDER — ACETAMINOPHEN 650 MG RE SUPP
650.0000 mg | Freq: Four times a day (QID) | RECTAL | Status: DC | PRN
  Administered 2018-10-24 – 2018-10-25 (×4): 650 mg via RECTAL
  Filled 2018-10-24 (×4): qty 1

## 2018-10-24 MED ORDER — LORAZEPAM 2 MG/ML IJ SOLN
INTRAMUSCULAR | Status: AC
  Administered 2018-10-24: 2 mg
  Filled 2018-10-24: qty 1

## 2018-10-24 MED ORDER — HALOPERIDOL LACTATE 5 MG/ML IJ SOLN: 5.0000 mg | Freq: Four times a day (QID) | INTRAMUSCULAR | Status: DC | PRN

## 2018-10-24 MED ORDER — PANTOPRAZOLE SODIUM 40 MG PO TBEC: 40.0000 mg | DELAYED_RELEASE_TABLET | Freq: Every day | ORAL | Status: DC

## 2018-10-24 MED ORDER — IOHEXOL 350 MG/ML SOLN
100.0000 mL | Freq: Once | INTRAVENOUS | Status: AC | PRN
  Administered 2018-10-24: 100 mL via INTRAVENOUS

## 2018-10-24 MED ORDER — METOPROLOL TARTRATE 5 MG/5ML IV SOLN
5.0000 mg | Freq: Once | INTRAVENOUS | Status: AC
  Administered 2018-10-24: 11:00:00 5 mg via INTRAVENOUS
  Filled 2018-10-24: qty 5

## 2018-10-24 MED ORDER — HYDRALAZINE HCL 20 MG/ML IJ SOLN
10.0000 mg | INTRAMUSCULAR | Status: AC
  Administered 2018-10-24: 14:00:00 10 mg via INTRAVENOUS
  Filled 2018-10-24: qty 1

## 2018-10-24 MED ORDER — ONDANSETRON HCL 4 MG/2ML IJ SOLN: 4.0000 mg | Freq: Four times a day (QID) | INTRAMUSCULAR | Status: DC | PRN

## 2018-10-24 MED ORDER — MORPHINE SULFATE (PF) 4 MG/ML IV SOLN: 4.0000 mg | INTRAVENOUS | Status: DC | PRN

## 2018-10-24 MED ORDER — NICARDIPINE HCL IN NACL 20-0.86 MG/200ML-% IV SOLN
3.0000 mg/h | INTRAVENOUS | Status: DC
  Administered 2018-10-24: 08:00:00 5 mg/h via INTRAVENOUS
  Filled 2018-10-24: qty 200

## 2018-10-24 MED ORDER — ONDANSETRON 4 MG PO TBDP: 4.0000 mg | ORAL_TABLET | Freq: Four times a day (QID) | ORAL | Status: DC | PRN

## 2018-10-24 MED ORDER — SODIUM CHLORIDE 0.9 % IV SOLN
INTRAVENOUS | Status: DC
  Administered 2018-10-24: 09:00:00 via INTRAVENOUS

## 2018-10-24 MED ORDER — MORPHINE SULFATE (PF) 2 MG/ML IV SOLN
2.0000 mg | INTRAVENOUS | Status: DC | PRN
  Administered 2018-10-24: 2 mg via INTRAVENOUS
  Filled 2018-10-24: qty 1

## 2018-10-24 MED ORDER — LEVOTHYROXINE SODIUM 100 MCG/5ML IV SOLN
12.5000 ug | Freq: Every day | INTRAVENOUS | Status: DC
  Administered 2018-10-25: 12.5 ug via INTRAVENOUS
  Filled 2018-10-24 (×2): qty 5

## 2018-10-24 MED ORDER — METOPROLOL TARTRATE 5 MG/5ML IV SOLN: 5.0000 mg | INTRAVENOUS | Status: DC | PRN

## 2018-10-24 MED ORDER — INSULIN ASPART 100 UNIT/ML ~~LOC~~ SOLN
0.0000 [IU] | SUBCUTANEOUS | Status: DC
  Administered 2018-10-25: 05:00:00 8 [IU] via SUBCUTANEOUS
  Administered 2018-10-25 (×3): 2 [IU] via SUBCUTANEOUS
  Administered 2018-10-25: 09:00:00 8 [IU] via SUBCUTANEOUS
  Administered 2018-10-26 (×2): 3 [IU] via SUBCUTANEOUS
  Administered 2018-10-26: 2 [IU] via SUBCUTANEOUS

## 2018-10-24 MED ORDER — PANTOPRAZOLE SODIUM 40 MG IV SOLR
40.0000 mg | Freq: Every day | INTRAVENOUS | Status: DC
  Administered 2018-10-25: 17:00:00 40 mg via INTRAVENOUS
  Filled 2018-10-24 (×3): qty 40

## 2018-10-24 MED ORDER — POTASSIUM CHLORIDE IN NACL 20-0.9 MEQ/L-% IV SOLN
INTRAVENOUS | Status: DC
  Administered 2018-10-24: 17:00:00 via INTRAVENOUS
  Administered 2018-10-25: 100 mL/h via INTRAVENOUS
  Administered 2018-10-25 – 2018-10-26 (×2): via INTRAVENOUS
  Filled 2018-10-24 (×4): qty 1000

## 2018-10-24 NOTE — ED Notes (Signed)
Brian Barry  Daughter # [phone]

## 2018-10-24 NOTE — ED Notes (Signed)
Per Dr. Maggie Font run NS @75ml /hr

## 2018-10-24 NOTE — Code Documentation (Signed)
85yo male arriving to [facility] via [facility] at [phone]. Patient from home where he resides with his wife at [facility]. He has reportedly been confused x 1 week and this morning sustained a fall around 0530. LKW unclear. Initially patient without change but later became nonverbal. EMS was called who activated a code stroke for altered mental status and combativeness. Patient with history of stroke on ASA and Plavix, DM, memory loss and wears CPAP at night. Stroke team at the bedside on patient arrival. Labs drawn and patient to CT with team. CT completed. NIHSS 20, see documentation for details and code stroke times. Patient with left gaze preference, right hemianopsia, right facial droop and global aphasia on exam. Patient agitated requiring Ativan 2mg  IVP after PIV placement. CTA head and neck completed. CT showing left SDH. Patient transported to the ED. No acute stroke treatment at this time. Bedside handoff with ED RN Thurmond Butts.

## 2018-10-24 NOTE — H&P (Signed)
[facility] Admission Note  [place] [birth year]  [id number].    Requesting MD: Sabra Heck  Chief Complaint/Reason for Consult:  HPI:  Patient is an 85 year old male who was brought to [facility] after a fall in the shower this AM. Patient lives [facility]. Patient's wife reported some mild confusion this AM but that patient was otherwise in usual state of health. Immediately after fall patient was still speaking and seemingly without issues until later at the table he stopped speaking or following commands. He was brought to the ED as a code stroke and has a history of CVA on ASA and plavix. Per chart patient is followed by Dr. Brigitte Pulse with neurology and has a history of intermittent confusion and aphasia, episodes can last up to several days. Patient currently aphasic and not able to provider any history. Per chart patient also has a hx of HTN, HLD, T2DM, hypothyroidism, BPH and some dementia.   Patient's daughter, Benjamine Mola is his POA. Patient is a DNR.   ROS: Review of Systems  Unable to perform ROS: Patient nonverbal    Family History  Problem Relation Age of Onset  . CVA Father 5  . Prostate cancer Neg Hx   . Kidney cancer Neg Hx   . Bladder Cancer Neg Hx     Past Medical History:  Diagnosis Date  . Arthritis   . BPH (benign prostatic hyperplasia)   . DM (diabetes mellitus) ([place])   . Hyperlipidemia   . Hypertension   . Hypothyroidism   . Osteoporosis   . Pelvic fracture ([place])   . TIA (transient ischemic attack) 1998, 2014    Past Surgical History:  Procedure Laterality Date  . bilateral hip replacement Bilateral 1998  . Left knee replacement Left 2007  . LUMBAR LAMINECTOMY  2010  . right hip revision Right 2008    Social History:  reports that he has never smoked. He has never used smokeless tobacco. He reports that he does not drink alcohol or use drugs.  Allergies: No Known Allergies  (Not in a hospital  admission)   Blood pressure (!) 140/92, pulse (!) 138, temperature 98.6 F (37 C), temperature source Oral, resp. rate (!) 23, height 6\' 1"  (1.854 m), weight 73.6 kg, SpO2 96 %. Physical Exam: Physical Exam Constitutional:      Appearance: He is well-developed and normal weight.  HENT:     Head: Normocephalic and atraumatic. No raccoon eyes, Battle's sign, abrasion, contusion or laceration.     Right Ear: External ear normal.     Left Ear: External ear normal.     Nose: Nose normal.     Mouth/Throat:     Lips: Pink.     Mouth: Mucous membranes are moist.     Comments: Teeth absent  Eyes:     General: Lids are normal. No scleral icterus.    Conjunctiva/sclera: Conjunctivae normal.     Comments: Pupils equal and round   Neck:     Musculoskeletal: Normal range of motion and neck supple.  Cardiovascular:     Rate and Rhythm: Normal rate and regular rhythm.     Pulses:          Radial pulses are 2+ on the right side and 2+ on the left side.       Dorsalis pedis pulses are 2+ on the right side and 2+ on the left side.     Comments: HTN Pulmonary:  Effort: Pulmonary effort is normal.     Breath sounds: Normal breath sounds.  Chest:     Chest wall: No lacerations, deformity or crepitus.  Abdominal:     General: Abdomen is flat. Bowel sounds are normal. There is no distension.     Palpations: Abdomen is soft.     Tenderness: There is no abdominal tenderness. There is no guarding.  Musculoskeletal:     Comments: Moving all 4 extremities with good strength  Skin:    Comments: Ecchymosis of bilateral forearms. No other abrasions or laceration present  Neurological:     Mental Status: He is alert.     GCS: GCS eye subscore is 2. GCS verbal subscore is 1. GCS motor subscore is 5.  Psychiatric:        Behavior: Behavior is uncooperative.     Comments: N/A     Results for orders placed or performed during the hospital encounter of 10/31/2018 (from the past 48 hour(s))  Ethanol      Status: None   Collection Time: 10/21/2018  7:28 AM  Result Value Ref Range   Alcohol, Ethyl (B) <10 <10 mg/dL    Comment: (NOTE) Lowest detectable limit for serum alcohol is 10 mg/dL. For medical purposes only. Performed at [facility], [place] [address], [place], [place] [address]   Protime-INR     Status: None   Collection Time: 10/28/2018  7:28 AM  Result Value Ref Range   Prothrombin Time 13.6 11.4 - 15.2 seconds   INR 1.1 0.8 - 1.2    Comment: (NOTE) INR goal varies based on device and disease states. Performed at [facility], [place] [address] [place], [place] [address]   APTT     Status: None   Collection Time: 11/02/2018  7:28 AM  Result Value Ref Range   aPTT 32 24 - 36 seconds    Comment: Performed at [facility] [address] [place], [place] [address]  CBC     Status: Abnormal   Collection Time: 11/04/2018  7:28 AM  Result Value Ref Range   WBC 6.3 4.0 - 10.5 K/uL   RBC 3.84 (L) 4.22 - 5.81 MIL/uL   Hemoglobin 12.3 (L) 13.0 - 17.0 g/dL   HCT 36.8 (L) 39.0 - 52.0 %   MCV 95.8 80.0 - 100.0 fL   MCH 32.0 26.0 - 34.0 pg   MCHC 33.4 30.0 - 36.0 g/dL   RDW 12.1 11.5 - 15.5 %   Platelets 192 150 - 400 K/uL   nRBC 0.0 0.0 - 0.2 %    Comment: Performed at [facility], [place] [address] [place], [place] [address]  Differential     Status: None   Collection Time: 10/19/2018  7:28 AM  Result Value Ref Range   Neutrophils Relative % 68 %   Neutro Abs 4.3 1.7 - 7.7 K/uL   Lymphocytes Relative 18 %   Lymphs Abs 1.1 0.7 - 4.0 K/uL   Monocytes Relative 10 %   Monocytes Absolute 0.7 0.1 - 1.0 K/uL   Eosinophils Relative 2 %   Eosinophils Absolute 0.1 0.0 - 0.5 K/uL   Basophils Relative 1 %   Basophils Absolute 0.0 0.0 - 0.1 K/uL   Immature Granulocytes 1 %   Abs Immature Granulocytes 0.03 0.00 - 0.07 K/uL    Comment: Performed at [facility] [address], [place], [place] [address]  Comprehensive metabolic panel  Status: Abnormal    Collection Time: 11/06/2018  7:28 AM  Result Value Ref Range   Sodium 132 (L) 135 - 145 mmol/L   Potassium 4.1 3.5 - 5.1 mmol/L   Chloride 94 (L) 98 - 111 mmol/L   CO2 25 22 - 32 mmol/L   Glucose, Bld 160 (H) 70 - 99 mg/dL   BUN 11 8 - 23 mg/dL   Creatinine, Ser 0.92 0.61 - 1.24 mg/dL   Calcium 9.4 8.9 - 10.3 mg/dL   Total Protein 6.8 6.5 - 8.1 g/dL   Albumin 4.3 3.5 - 5.0 g/dL   AST 29 15 - 41 U/L   ALT 14 0 - 44 U/L   Alkaline Phosphatase 50 38 - 126 U/L   Total Bilirubin 0.5 0.3 - 1.2 mg/dL   GFR calc non Af Amer >60 >60 mL/min   GFR calc Af Amer >60 >60 mL/min   Anion gap 13 5 - 15    Comment: Performed at [facility], [place] [address], [place], [place] [address]  I-stat chem 8, ED     Status: Abnormal   Collection Time: 10/21/2018  7:32 AM  Result Value Ref Range   Sodium 131 (L) 135 - 145 mmol/L   Potassium 4.1 3.5 - 5.1 mmol/L   Chloride 93 (L) 98 - 111 mmol/L   BUN 12 8 - 23 mg/dL   Creatinine, Ser 0.70 0.61 - 1.24 mg/dL   Glucose, Bld 156 (H) 70 - 99 mg/dL   Calcium, Ion 1.13 (L) 1.15 - 1.40 mmol/L   TCO2 28 22 - 32 mmol/L   Hemoglobin 12.9 (L) 13.0 - 17.0 g/dL   HCT 38.0 (L) 39.0 - 52.0 %   Ct Angio Head W Or Wo Contrast  Result Date: 11/03/2018 CLINICAL DATA:  Fall. Leftward gaze, right facial droop, right hemianopia, and aphasia. Subdural hematoma on earlier head CT. EXAM: CT ANGIOGRAPHY HEAD AND NECK CT CERVICAL SPINE WITHOUT CONTRAST TECHNIQUE: Multidetector CT imaging of the head and neck was performed using the standard protocol during bolus administration of intravenous contrast. Multiplanar CT image reconstructions and MIPs were obtained to evaluate the vascular anatomy. Carotid stenosis measurements (when applicable) are obtained utilizing NASCET criteria, using the distal internal carotid diameter as the denominator. CT images of the cervical spine including coronal and sagittal reformats were reconstructed from the CTA data set. CONTRAST:  147mL OMNIPAQUE  IOHEXOL 350 MG/ML SOLN COMPARISON:  Noncontrast head CT 10/21/2018 at 0733 hours. Brain MRI 05/25/2018. Head and neck CTA 11/12/2017. FINDINGS: CT HEAD FINDINGS There is moderate motion artifact through the skull base. Brain: Chronic infarcts are again seen in the right frontal lobe, bilateral basal ganglia, right thalamus, and left pons patchy bilateral cerebral white matter hypodensities are nonspecific but compatible with moderate chronic small vessel ischemic disease. There is moderate cerebral atrophy. A small subdural hematoma is again noted over the left cerebral convexity measuring up to 7 mm in thickness without midline shift or other significant mass effect. There is also a smaller subdural hematoma over the right frontotemporal convexity measuring up to 5 mm in thickness without mass effect which was obscured by motion artifact on the earlier study. Vascular: Calcified atherosclerosis at the skull base. Skull: No fracture identified within limitations of motion artifact. Mild right sided scalp swelling. Sinuses: Paranasal sinuses and mastoid air cells are clear. Orbits: Bilateral cataract extraction. Review of the MIP images confirms the above findings CTA NECK FINDINGS Aortic arch: Standard 3 vessel aortic arch with  widely patent arch vessel origins. Mild arch atherosclerosis. Moderate calcified plaque in the proximal left subclavian artery not resulting in significant stenosis. Right carotid system: Patent with mild predominantly calcified plaque about the carotid bifurcation. No evidence of significant stenosis or dissection. Left carotid system: Patent with mild-to-moderate predominantly calcified plaque at the carotid bifurcation and in the carotid bulb. No evidence of significant stenosis or dissection. Vertebral arteries: Patent with the left being mildly dominant. Unchanged calcified plaque at the left vertebral artery origin resulting in mild stenosis. Skeleton: No acute osseous abnormality  identified. Detailed cervical spine assessment as reported below. Other neck: No evidence of cervical lymphadenopathy or mass. Upper chest: Clear lung apices. Review of the MIP images confirms the above findings CTA HEAD FINDINGS Anterior circulation: The internal carotid arteries are patent from skull base to carotid termini with atherosclerotic plaque resulting in mild bilateral paraclinoid stenosis, stable to mildly progressed. ACAs and MCAs are patent without evidence of significant A1 or M1 stenosis. Branch vessel assessment is mildly to moderately limited by motion artifact, however no proximal branch occlusion is identified. No aneurysm is seen. Posterior circulation: The intracranial vertebral arteries are patent to the basilar with mild atherosclerotic irregularity resulting in less than 50% narrowing on the right. The basilar artery is widely patent. Posterior communicating arteries are not identified and may be diminutive or absent. The PCAs are patent with bilateral proximal P2 stenoses, severe on the right and moderate on the left and which appear to have mildly progressed from the prior CTA although motion artifact mildly limits assessment. There is also a severe distal right P2 stenosis which is new. No aneurysm is identified. Venous sinuses: Poorly evaluated due to arterial contrast timing. Anatomic variants: None. Review of the MIP images confirms the above findings CT CERVICAL SPINE FINDINGS Advanced facet degeneration in the cervical spine with trace anterolisthesis of C3 on C4 and 3 mm anterolisthesis of C4 on C5, C5 on C6, and C7 on T1. Multilevel disc degeneration, severe at C6-7. Moderate to severe multilevel neural foraminal stenosis. No evidence of significant osseous spinal canal stenosis. No acute fracture identified. No visible spinal canal hematoma or prevertebral fluid. IMPRESSION: HEAD CT: 1. Small bilateral subdural hematomas, left larger than right. No mass effect. 2. Chronic  ischemic changes with multiple old infarcts as above. HEAD AND NECK CTA: 1. No emergent large vessel occlusion. 2. Progressive intracranial atherosclerosis including severe right and moderate left proximal P2 stenoses. 3. Widely patent cervical carotid arteries. 4. Unchanged mild left vertebral artery origin stenosis. 5.  Aortic Atherosclerosis (ICD10-I70.0). CERVICAL SPINE CT: 1. No acute cervical spine fracture. 2. Advanced cervical disc and facet degeneration. Electronically Signed   By: Logan Bores M.D.   On: 10/16/2018 08:48   Ct Angio Neck W Or Wo Contrast  Result Date: 10/18/2018 CLINICAL DATA:  Fall. Leftward gaze, right facial droop, right hemianopia, and aphasia. Subdural hematoma on earlier head CT. EXAM: CT ANGIOGRAPHY HEAD AND NECK CT CERVICAL SPINE WITHOUT CONTRAST TECHNIQUE: Multidetector CT imaging of the head and neck was performed using the standard protocol during bolus administration of intravenous contrast. Multiplanar CT image reconstructions and MIPs were obtained to evaluate the vascular anatomy. Carotid stenosis measurements (when applicable) are obtained utilizing NASCET criteria, using the distal internal carotid diameter as the denominator. CT images of the cervical spine including coronal and sagittal reformats were reconstructed from the CTA data set. CONTRAST:  148mL OMNIPAQUE IOHEXOL 350 MG/ML SOLN COMPARISON:  Noncontrast head CT 11/03/2018 at 0733 hours.  Brain MRI 05/25/2018. Head and neck CTA 11/12/2017. FINDINGS: CT HEAD FINDINGS There is moderate motion artifact through the skull base. Brain: Chronic infarcts are again seen in the right frontal lobe, bilateral basal ganglia, right thalamus, and left pons patchy bilateral cerebral white matter hypodensities are nonspecific but compatible with moderate chronic small vessel ischemic disease. There is moderate cerebral atrophy. A small subdural hematoma is again noted over the left cerebral convexity measuring up to 7 mm in  thickness without midline shift or other significant mass effect. There is also a smaller subdural hematoma over the right frontotemporal convexity measuring up to 5 mm in thickness without mass effect which was obscured by motion artifact on the earlier study. Vascular: Calcified atherosclerosis at the skull base. Skull: No fracture identified within limitations of motion artifact. Mild right sided scalp swelling. Sinuses: Paranasal sinuses and mastoid air cells are clear. Orbits: Bilateral cataract extraction. Review of the MIP images confirms the above findings CTA NECK FINDINGS Aortic arch: Standard 3 vessel aortic arch with widely patent arch vessel origins. Mild arch atherosclerosis. Moderate calcified plaque in the proximal left subclavian artery not resulting in significant stenosis. Right carotid system: Patent with mild predominantly calcified plaque about the carotid bifurcation. No evidence of significant stenosis or dissection. Left carotid system: Patent with mild-to-moderate predominantly calcified plaque at the carotid bifurcation and in the carotid bulb. No evidence of significant stenosis or dissection. Vertebral arteries: Patent with the left being mildly dominant. Unchanged calcified plaque at the left vertebral artery origin resulting in mild stenosis. Skeleton: No acute osseous abnormality identified. Detailed cervical spine assessment as reported below. Other neck: No evidence of cervical lymphadenopathy or mass. Upper chest: Clear lung apices. Review of the MIP images confirms the above findings CTA HEAD FINDINGS Anterior circulation: The internal carotid arteries are patent from skull base to carotid termini with atherosclerotic plaque resulting in mild bilateral paraclinoid stenosis, stable to mildly progressed. ACAs and MCAs are patent without evidence of significant A1 or M1 stenosis. Branch vessel assessment is mildly to moderately limited by motion artifact, however no proximal branch  occlusion is identified. No aneurysm is seen. Posterior circulation: The intracranial vertebral arteries are patent to the basilar with mild atherosclerotic irregularity resulting in less than 50% narrowing on the right. The basilar artery is widely patent. Posterior communicating arteries are not identified and may be diminutive or absent. The PCAs are patent with bilateral proximal P2 stenoses, severe on the right and moderate on the left and which appear to have mildly progressed from the prior CTA although motion artifact mildly limits assessment. There is also a severe distal right P2 stenosis which is new. No aneurysm is identified. Venous sinuses: Poorly evaluated due to arterial contrast timing. Anatomic variants: None. Review of the MIP images confirms the above findings CT CERVICAL SPINE FINDINGS Advanced facet degeneration in the cervical spine with trace anterolisthesis of C3 on C4 and 3 mm anterolisthesis of C4 on C5, C5 on C6, and C7 on T1. Multilevel disc degeneration, severe at C6-7. Moderate to severe multilevel neural foraminal stenosis. No evidence of significant osseous spinal canal stenosis. No acute fracture identified. No visible spinal canal hematoma or prevertebral fluid. IMPRESSION: HEAD CT: 1. Small bilateral subdural hematomas, left larger than right. No mass effect. 2. Chronic ischemic changes with multiple old infarcts as above. HEAD AND NECK CTA: 1. No emergent large vessel occlusion. 2. Progressive intracranial atherosclerosis including severe right and moderate left proximal P2 stenoses. 3. Widely patent cervical carotid  arteries. 4. Unchanged mild left vertebral artery origin stenosis. 5.  Aortic Atherosclerosis (ICD10-I70.0). CERVICAL SPINE CT: 1. No acute cervical spine fracture. 2. Advanced cervical disc and facet degeneration. Electronically Signed   By: Logan Bores M.D.   On: 10/14/2018 08:48   Ct C-spine No Charge  Result Date: 10/27/2018 CLINICAL DATA:  Fall. Leftward  gaze, right facial droop, right hemianopia, and aphasia. Subdural hematoma on earlier head CT. EXAM: CT ANGIOGRAPHY HEAD AND NECK CT CERVICAL SPINE WITHOUT CONTRAST TECHNIQUE: Multidetector CT imaging of the head and neck was performed using the standard protocol during bolus administration of intravenous contrast. Multiplanar CT image reconstructions and MIPs were obtained to evaluate the vascular anatomy. Carotid stenosis measurements (when applicable) are obtained utilizing NASCET criteria, using the distal internal carotid diameter as the denominator. CT images of the cervical spine including coronal and sagittal reformats were reconstructed from the CTA data set. CONTRAST:  145mL OMNIPAQUE IOHEXOL 350 MG/ML SOLN COMPARISON:  Noncontrast head CT 10/14/2018 at 0733 hours. Brain MRI 05/25/2018. Head and neck CTA 11/12/2017. FINDINGS: CT HEAD FINDINGS There is moderate motion artifact through the skull base. Brain: Chronic infarcts are again seen in the right frontal lobe, bilateral basal ganglia, right thalamus, and left pons patchy bilateral cerebral white matter hypodensities are nonspecific but compatible with moderate chronic small vessel ischemic disease. There is moderate cerebral atrophy. A small subdural hematoma is again noted over the left cerebral convexity measuring up to 7 mm in thickness without midline shift or other significant mass effect. There is also a smaller subdural hematoma over the right frontotemporal convexity measuring up to 5 mm in thickness without mass effect which was obscured by motion artifact on the earlier study. Vascular: Calcified atherosclerosis at the skull base. Skull: No fracture identified within limitations of motion artifact. Mild right sided scalp swelling. Sinuses: Paranasal sinuses and mastoid air cells are clear. Orbits: Bilateral cataract extraction. Review of the MIP images confirms the above findings CTA NECK FINDINGS Aortic arch: Standard 3 vessel aortic arch  with widely patent arch vessel origins. Mild arch atherosclerosis. Moderate calcified plaque in the proximal left subclavian artery not resulting in significant stenosis. Right carotid system: Patent with mild predominantly calcified plaque about the carotid bifurcation. No evidence of significant stenosis or dissection. Left carotid system: Patent with mild-to-moderate predominantly calcified plaque at the carotid bifurcation and in the carotid bulb. No evidence of significant stenosis or dissection. Vertebral arteries: Patent with the left being mildly dominant. Unchanged calcified plaque at the left vertebral artery origin resulting in mild stenosis. Skeleton: No acute osseous abnormality identified. Detailed cervical spine assessment as reported below. Other neck: No evidence of cervical lymphadenopathy or mass. Upper chest: Clear lung apices. Review of the MIP images confirms the above findings CTA HEAD FINDINGS Anterior circulation: The internal carotid arteries are patent from skull base to carotid termini with atherosclerotic plaque resulting in mild bilateral paraclinoid stenosis, stable to mildly progressed. ACAs and MCAs are patent without evidence of significant A1 or M1 stenosis. Branch vessel assessment is mildly to moderately limited by motion artifact, however no proximal branch occlusion is identified. No aneurysm is seen. Posterior circulation: The intracranial vertebral arteries are patent to the basilar with mild atherosclerotic irregularity resulting in less than 50% narrowing on the right. The basilar artery is widely patent. Posterior communicating arteries are not identified and may be diminutive or absent. The PCAs are patent with bilateral proximal P2 stenoses, severe on the right and moderate on the left and  which appear to have mildly progressed from the prior CTA although motion artifact mildly limits assessment. There is also a severe distal right P2 stenosis which is new. No aneurysm  is identified. Venous sinuses: Poorly evaluated due to arterial contrast timing. Anatomic variants: None. Review of the MIP images confirms the above findings CT CERVICAL SPINE FINDINGS Advanced facet degeneration in the cervical spine with trace anterolisthesis of C3 on C4 and 3 mm anterolisthesis of C4 on C5, C5 on C6, and C7 on T1. Multilevel disc degeneration, severe at C6-7. Moderate to severe multilevel neural foraminal stenosis. No evidence of significant osseous spinal canal stenosis. No acute fracture identified. No visible spinal canal hematoma or prevertebral fluid. IMPRESSION: HEAD CT: 1. Small bilateral subdural hematomas, left larger than right. No mass effect. 2. Chronic ischemic changes with multiple old infarcts as above. HEAD AND NECK CTA: 1. No emergent large vessel occlusion. 2. Progressive intracranial atherosclerosis including severe right and moderate left proximal P2 stenoses. 3. Widely patent cervical carotid arteries. 4. Unchanged mild left vertebral artery origin stenosis. 5.  Aortic Atherosclerosis (ICD10-I70.0). CERVICAL SPINE CT: 1. No acute cervical spine fracture. 2. Advanced cervical disc and facet degeneration. Electronically Signed   By: Logan Bores M.D.   On: 10/22/2018 08:48   Ct Head Code Stroke Wo Contrast  Result Date: 11/04/2018 CLINICAL DATA:  Code stroke.  Altered mental status. EXAM: CT HEAD WITHOUT CONTRAST TECHNIQUE: Contiguous axial images were obtained from the base of the skull through the vertex without intravenous contrast. COMPARISON:  MRI brain 05/25/2018 FINDINGS: Brain: The study is severely degraded by patient motion. Remote infarcts involving the right greater than left basal ganglia and bilateral corona radiata are again noted. Remote right frontal lobe infarct is noted. Left subdural hematoma is suspected. There is no significant midline shift. No definite posterior fossa lesions are present. The ventricles are proportionate to the degree of atrophy.  Ventricle size is stable. Vascular: Motion obscures evaluation of the vessels. Skull: Motion decreases sensitivity for skull lesions. No definite fractures are present. Sinuses/Orbits: No significant sinus disease is evident. ASPECTS [facility] Stroke Program Early CT Score) - Ganglionic level infarction (caudate, lentiform nuclei, internal capsule, insula, M1-M3 cortex): A 7/7 - Supraganglionic infarction (M4-M6 cortex): 3/3 Total score (0-10 with 10 being normal): 10/10 IMPRESSION: 1. Small left subdural hematoma without significant midline shift. 2. Multiple remote infarcts are stable. 3. The study is severely degraded by patient motion. 4. No acute infarcts are evident. 5. ASPECTS is 10/10 These results were called by telephone at the time of interpretation on 10/25/2018 at 7:53 am to Dr. Leonel Ramsay, who verbally acknowledged these results. Electronically Signed   By: San Morelle M.D.   On: 11/07/2018 07:54      Assessment/Plan Hx of TIA HTN HLD T2DM - SSI BPH Hypothyroidism - IV levothyroxine Mild dementia  Fall Bilateral SDH - NS consulted, recommending following clinically - TBI therapies  DNR - per discussion with patient's POA, no intubation, no CPR, no feeding tube  FEN: NPO, IVF - will need SLP eval VTE: SCDs, hold home plavix and ASA ID: no abx indicated   Brigid Re, [facility] Surgery 10/25/2018, 10:08 AM Pager: [phone]

## 2018-10-24 NOTE — ED Notes (Signed)
Pt daughter given update on pt status.

## 2018-10-24 NOTE — ED Provider Notes (Signed)
The patient is a 85 year old male, he has a history of a TIA, history of hypertension and dementia, he has type 2 diabetes, mixed Alzheimer's and vascular dementia he is coming, is currently living in a independent living area.  Apparently the patient had been up overnight, had a fall backwards striking his head and this morning was not his normal self and arrived as a code stroke.  The patient is unable to follow commands, he is moving all 4 extremities, he appears very agitated, he does not answer questions, he does seem to be moving all 4 extremities and has no obvious facial droop.  There is no obvious signs of trauma on his head, he is on both Plavix and aspirin.  CT scan images interpreted and reviewed by myself showing a subdural hematoma, I agree with the neurologist Dr. Kathrynn Speed it was at the bedside and recommended neurosurgical evaluation.  The patient has no reversal agent for these anticoagulants however we will control his blood pressure and discussed the care with the neurosurgeon.  We will also discuss with trauma surgery as the patient may not be a good surgical candidate.  Cardene, BP control, admit to hospital, the pt is DNR -   .Critical Care Performed by: Noemi Chapel, MD Authorized by: Noemi Chapel, MD   Critical care provider statement:    Critical care time (minutes):  35   Critical care time was exclusive of:  Separately billable procedures and treating other patients and teaching time   Critical care was necessary to treat or prevent imminent or life-threatening deterioration of the following conditions:  CNS failure or compromise   Critical care was time spent personally by me on the following activities:  Blood draw for specimens, development of treatment plan with patient or surrogate, discussions with consultants, evaluation of patient's response to treatment, examination of patient, obtaining history from patient or surrogate, ordering and performing  treatments and interventions, ordering and review of laboratory studies, ordering and review of radiographic studies, pulse oximetry, re-evaluation of patient's condition and review of old charts   Medical screening examination/treatment/procedure(s) were conducted as a shared visit with non-physician practitioner(s) and myself.  I personally evaluated the patient during the encounter.  Clinical Impression:   Final diagnoses:  Subdural hematoma ([place])          Noemi Chapel, MD 10/25/18 [phone]

## 2018-10-24 NOTE — Progress Notes (Signed)
Patient ID: Brian Barry, male   DOB: 25-Oct-1929, 85 y.o.   MRN: [id number] Due to th patient's mental status, it will be very helpful for his wife to stay at his bedside.  Georganna Skeans, MD, MPH, FACS Trauma & General Surgery: [phone]

## 2018-10-24 NOTE — Consult Note (Signed)
Chief Complaint   Code stroke  HPI   Consult requested by: PA Layden Reason for consult: Left SDH  HPI: Brian Barry is a 85 y.o. male with history of hyperlipidemia, HTN, dementia and CVA on ASA and Plavix who was brought to ED as a code stroke. Patient is aphasic and unable to provide any history. History obtained via chart review and discussion with ED personnel. I was also able to discuss patient's care with daughter, Brian Barry, who is the patient's POA.  Daughter reports a long standing history of episodes of confusion and aphasia that occur randomly. These episodes can last several days when they do occur.  He has been seen and evaluated previously by Neurology (Dr Brigitte Pulse [facility]) and has undergone long term EEG, but ultimately it was determined that these episodes are not seizures. He is not on anti-epileptic medication.  Starting several days ago, patient became a little more confused and agitated, which is not out of then norm. He fell today approx 0530 in the shower and struck head. No LOC. Was able to get up and walk to recliner. Wife wasn't going to call EMS initially, but after he became aphasic, she thought he should be evaluated.   Code stroke was called due to aphasia. Patient underwent stat head CT which revealed a small, acute b/l SDH so NS consultation was requested.  At baseline Brian Barry lives with his wife. He is able to perform ADLs with assistance.  He is DNR. His daughter confirms that family has decided should he deteriorate, they will not pursue surgical intervention.  Prior to my examination, patient was given ativan due to agitation/restlessness. He is resting comfortably.  Patient Active Problem List   Diagnosis Date Noted   CVA (cerebral vascular accident) ([place]) 11/12/2017   TIA (transient ischemic attack) 07/16/2016   Confusion    Ascending aortic aneurysm ([place]) 05/19/2016   Hyponatremia 05/19/2016   Generalized weakness 05/19/2016    Sepsis due to pneumonia ([place]) 05/18/2016   Hip fracture ([place]) 09/10/2015    PMH: Past Medical History:  Diagnosis Date   Arthritis    BPH (benign prostatic hyperplasia)    DM (diabetes mellitus) ([place])    Hyperlipidemia    Hypertension    Hypothyroidism    Osteoporosis    Pelvic fracture ([place])    TIA (transient ischemic attack) 1998, 2014    [place]: Past Surgical History:  Procedure Laterality Date   bilateral hip replacement Bilateral 1998   Left knee replacement Left 2007   LUMBAR LAMINECTOMY  2010   right hip revision Right 2008    (Not in a hospital admission)   SH: Social History   Tobacco Use   Smoking status: Never Smoker   Smokeless tobacco: Never Used  Substance Use Topics   Alcohol use: No    Alcohol/week: 0.0 standard drinks   Drug use: No    MEDS: Prior to Admission medications   Medication Sig Start Date End Date Taking? Authorizing Provider  aspirin 81 MG chewable tablet Chew 1 tablet (81 mg total) by mouth daily. 05/28/18   Epifanio Lesches, MD  Calcium Carbonate-Vitamin D 600-400 MG-UNIT tablet Take 1 tablet by mouth 2 (two) times daily.    [provider]  clopidogrel (PLAVIX) 75 MG tablet Take 75 mg by mouth daily. 07/11/15   [provider]  finasteride (PROSCAR) 5 MG tablet Take 5 mg by mouth daily.    [provider]  gabapentin (NEURONTIN) 300 MG capsule  Take 300 mg by mouth at bedtime.    [provider]  galantamine (RAZADYNE ER) 8 MG 24 hr capsule Take 8 mg by mouth daily with breakfast.    [provider]  glipiZIDE (GLUCOTROL) 5 MG tablet Take 5 mg by mouth daily before breakfast.    [provider]  insulin aspart (NOVOLOG) 100 UNIT/ML FlexPen Inject 5 Units into the skin 3 (three) times daily with meals. Patient not taking: Reported on 07/12/2016 05/19/16   Theodoro Grist, MD  LANTUS SOLOSTAR 100 UNIT/ML Solostar Pen Inject 20 Units into the skin daily.     [provider]  levothyroxine (SYNTHROID, LEVOTHROID) 25 MCG tablet Take 25 mcg by mouth at bedtime.     [provider]  lisinopril (PRINIVIL,ZESTRIL) 10 MG tablet Take 10 mg by mouth daily.  07/11/15   [provider]  metFORMIN (GLUCOPHAGE) 500 MG tablet Take 1,000 mg by mouth 2 (two) times daily.  08/12/15   [provider]  polyethylene glycol (MIRALAX / GLYCOLAX) packet Take 17 g by mouth daily. Patient taking differently: Take 17 g by mouth daily as needed for moderate constipation.  09/12/15   Loletha Grayer, MD  PROLIA 60 MG/ML SOSY injection Inject 60 mg into the skin every 6 (six) months. 09/13/17   [provider]  senna-docusate (SENOKOT-S) 8.6-50 MG tablet Take 1 tablet by mouth at bedtime as needed for mild constipation. 09/12/15   Loletha Grayer, MD  simvastatin (ZOCOR) 20 MG tablet Take 20 mg by mouth every evening.  07/11/15   [provider]  tamsulosin (FLOMAX) 0.4 MG CAPS capsule Take 0.4 mg by mouth 2 (two) times daily. 08/08/15   [provider]  vitamin B-12 (CYANOCOBALAMIN) 1000 MCG tablet Take 1,000 mcg daily by mouth.    [provider]    ALLERGY: No Known Allergies  Social History   Tobacco Use   Smoking status: Never Smoker   Smokeless tobacco: Never Used  Substance Use Topics   Alcohol use: No    Alcohol/week: 0.0 standard drinks     Family History  Problem Relation Age of Onset   CVA Father 25   Prostate cancer Neg Hx    Kidney cancer Neg Hx    Bladder Cancer Neg Hx      ROS   ROS unable to obtain secondary to AMS  Exam   Vitals:   11/01/2018 0822 10/27/2018 0830  BP: (!) 164/97 (!) 162/81  Pulse: (!) 121 (!) 123  Resp: (!) 23 19  Temp:    SpO2: 100% 100%   Elderly male, resting comfortably Eyes closed. Does open eyes to voice. PERRL aphasic Does not follow commands Does move all extremities spontaneously W/D BUE and BLE to pain CN difficult to assess but grossly  intact Unable to assess FNF  Results - Imaging/Labs   Results for orders placed or performed during the hospital encounter of 10/17/2018 (from the past 48 hour(s))  Ethanol     Status: None   Collection Time: 10/29/2018  7:28 AM  Result Value Ref Range   Alcohol, Ethyl (B) <10 <10 mg/dL    Comment: (NOTE) Lowest detectable limit for serum alcohol is 10 mg/dL. For medical purposes only. Performed at [facility], [place] [address] [place], [place] [address]   Protime-INR     Status: None   Collection Time: 10/16/2018  7:28 AM  Result Value Ref Range   Prothrombin Time 13.6 11.4 - 15.2 seconds  INR 1.1 0.8 - 1.2    Comment: (NOTE) INR goal varies based on device and disease states. Performed at [facility], [place] [address] [place], [place] [address]   APTT     Status: None   Collection Time: 11/02/2018  7:28 AM  Result Value Ref Range   aPTT 32 24 - 36 seconds    Comment: Performed at [facility] [address], [place], [place] [address]  CBC     Status: Abnormal   Collection Time:   7:28 AM  Result Value Ref Range   WBC 6.3 4.0 - 10.5 K/uL   RBC 3.84 (L) 4.22 - 5.81 MIL/uL   Hemoglobin 12.3 (L) 13.0 - 17.0 g/dL   HCT 36.8 (L) 39.0 - 52.0 %   MCV 95.8 80.0 - 100.0 fL   MCH 32.0 26.0 - 34.0 pg   MCHC 33.4 30.0 - 36.0 g/dL   RDW 12.1 11.5 - 15.5 %   Platelets 192 150 - 400 K/uL   nRBC 0.0 0.0 - 0.2 %    Comment: Performed at [facility], [place] [address], [place], [place] [address]  Differential     Status: None   Collection Time: 10/18/2018  7:28 AM  Result Value Ref Range   Neutrophils Relative % 68 %   Neutro Abs 4.3 1.7 - 7.7 K/uL   Lymphocytes Relative 18 %   Lymphs Abs 1.1 0.7 - 4.0 K/uL   Monocytes Relative 10 %   Monocytes Absolute 0.7 0.1 - 1.0 K/uL   Eosinophils Relative 2 %   Eosinophils Absolute 0.1 0.0 - 0.5 K/uL   Basophils Relative 1 %   Basophils Absolute 0.0 0.0 - 0.1 K/uL   Immature Granulocytes 1 %   Abs Immature  Granulocytes 0.03 0.00 - 0.07 K/uL    Comment: Performed at [place] [address], [place], [place] [address]  Comprehensive metabolic panel     Status: Abnormal   Collection Time: 10/23/2018  7:28 AM  Result Value Ref Range   Sodium 132 (L) 135 - 145 mmol/L   Potassium 4.1 3.5 - 5.1 mmol/L   Chloride 94 (L) 98 - 111 mmol/L   CO2 25 22 - 32 mmol/L   Glucose, Bld 160 (H) 70 - 99 mg/dL   BUN 11 8 - 23 mg/dL   Creatinine, Ser 0.92 0.61 - 1.24 mg/dL   Calcium 9.4 8.9 - 10.3 mg/dL   Total Protein 6.8 6.5 - 8.1 g/dL   Albumin 4.3 3.5 - 5.0 g/dL   AST 29 15 - 41 U/L   ALT 14 0 - 44 U/L   Alkaline Phosphatase 50 38 - 126 U/L   Total Bilirubin 0.5 0.3 - 1.2 mg/dL   GFR calc non Af Amer >60 >60 mL/min   GFR calc Af Amer >60 >60 mL/min   Anion gap 13 5 - 15    Comment: Performed at [facility], [place] [address] [place], [place] [address]  I-stat chem 8, ED     Status: Abnormal   Collection Time: 10/21/2018  7:32 AM  Result Value Ref Range   Sodium 131 (L) 135 - 145 mmol/L   Potassium 4.1 3.5 - 5.1 mmol/L   Chloride 93 (L) 98 - 111 mmol/L   BUN 12 8 - 23 mg/dL   Creatinine, Ser 0.70 0.61 - 1.24 mg/dL   Glucose, Bld 156 (H) 70 - 99 mg/dL   Calcium, Ion 1.13 (  L) 1.15 - 1.40 mmol/L   TCO2 28 22 - 32 mmol/L   Hemoglobin 12.9 (L) 13.0 - 17.0 g/dL   HCT 38.0 (L) 39.0 - 52.0 %    Ct Head Code Stroke Wo Contrast  Result Date: 10/27/2018 CLINICAL DATA:  Code stroke.  Altered mental status. EXAM: CT HEAD WITHOUT CONTRAST TECHNIQUE: Contiguous axial images were obtained from the base of the skull through the vertex without intravenous contrast. COMPARISON:  MRI brain 05/25/2018 FINDINGS: Brain: The study is severely degraded by patient motion. Remote infarcts involving the right greater than left basal ganglia and bilateral corona radiata are again noted. Remote right frontal lobe infarct is noted. Left subdural hematoma is suspected. There is no significant midline shift. No definite  posterior fossa lesions are present. The ventricles are proportionate to the degree of atrophy. Ventricle size is stable. Vascular: Motion obscures evaluation of the vessels. Skull: Motion decreases sensitivity for skull lesions. No definite fractures are present. Sinuses/Orbits: No significant sinus disease is evident. ASPECTS [facility] Stroke Program Early CT Score) - Ganglionic level infarction (caudate, lentiform nuclei, internal capsule, insula, M1-M3 cortex): A 7/7 - Supraganglionic infarction (M4-M6 cortex): 3/3 Total score (0-10 with 10 being normal): 10/10 IMPRESSION: 1. Small left subdural hematoma without significant midline shift. 2. Multiple remote infarcts are stable. 3. The study is severely degraded by patient motion. 4. No acute infarcts are evident. 5. ASPECTS is 10/10 These results were called by telephone at the time of interpretation on 10/23/2018 at 7:53 am to Dr. Leonel Ramsay, who verbally acknowledged these results. Electronically Signed   By: San Morelle M.D.   On: 10/23/2018 07:54    Impression/Plan   85 y.o. male with small bilateral SDH, L>R without mass effect or midline shift. He is altered and densely aphasic which apparently is not out of the norm for him as he occassionally has episodes of this - followed by Dr Brigitte Pulse, Neuro at [facility]. These small SDH certainly do not require any NS intervention at present. Daughter who is POA states that should these small SDH worsen and should he deteriorate, they will not pursue NS intervention. She confirmed this with her siblings and pt's wife.  Rec admission under [place] for observation. No need to repeat head CT as this will not change management from NS perspective. D/C ASA and Plavix.   Please call for any concerns.    Ferne Reus, PA-C [facility] [employer]

## 2018-10-24 NOTE — ED Provider Notes (Signed)
[facility] EMERGENCY DEPARTMENT Provider Note   CSN: [id number] Arrival date & time: 10/29/2018  6834  An emergency department physician performed an initial assessment on this suspected stroke patient at 0725.  History   Chief Complaint No chief complaint on file.   HPI Brian Barry is a 85 y.o. male history of hyperlipidemia, hypertension, TIA, CVA who presents for evaluation after a fall from home.  Per EMS, patient fell at home around 5:30 AM.  He is currently on Plavix and aspirin.  After fall, became less verbal and more combative, prompting EMS call.  EM LEVEL 5 CAVEAT: DUE TO CHANGE IN MENTAL STATUS.   Brian Barry (Wife): [phone] Brian Barry (Daughter): [phone]     The history is provided by the patient.    Past Medical History:  Diagnosis Date   Arthritis    BPH (benign prostatic hyperplasia)    DM (diabetes mellitus) ([place])    Hyperlipidemia    Hypertension    Hypothyroidism    Osteoporosis    Pelvic fracture ([place])    TIA (transient ischemic attack) 1998, 2014    Patient Active Problem List   Diagnosis Date Noted   SDH (subdural hematoma) ([place]) 11/03/2018   CVA (cerebral vascular accident) ([place]) 11/12/2017   TIA (transient ischemic attack) 07/16/2016   Confusion    Ascending aortic aneurysm ([place]) 05/19/2016   Hyponatremia 05/19/2016   Generalized weakness 05/19/2016   Sepsis due to pneumonia ([place]) 05/18/2016   Hip fracture ([place]) 09/10/2015    Past Surgical History:  Procedure Laterality Date   bilateral hip replacement Bilateral 1998   Left knee replacement Left 2007   LUMBAR LAMINECTOMY  2010   right hip revision Right 2008        Home Medications    Prior to Admission medications   Medication Sig Start Date End Date Taking? Authorizing Provider  aspirin 81 MG chewable tablet Chew 1 tablet (81 mg total) by mouth daily. 05/28/18  Yes Epifanio Lesches, MD  Calcium Carbonate-Vitamin D 600-400  MG-UNIT tablet Take 1 tablet by mouth 2 (two) times daily.   Yes [provider]  clopidogrel (PLAVIX) 75 MG tablet Take 75 mg by mouth daily. 07/11/15  Yes [provider]  finasteride (PROSCAR) 5 MG tablet Take 5 mg by mouth daily.   Yes [provider]  gabapentin (NEURONTIN) 300 MG capsule Take 300 mg by mouth at bedtime.   Yes [provider]  galantamine (RAZADYNE ER) 8 MG 24 hr capsule Take 8 mg by mouth daily with breakfast.   Yes [provider]  LANTUS SOLOSTAR 100 UNIT/ML Solostar Pen Inject 20 Units into the skin daily.    Yes [provider]  levothyroxine (SYNTHROID, LEVOTHROID) 25 MCG tablet Take 25 mcg by mouth at bedtime.    Yes [provider]  lisinopril (PRINIVIL,ZESTRIL) 10 MG tablet Take 10 mg by mouth daily.  07/11/15  Yes [provider]  metFORMIN (GLUCOPHAGE) 500 MG tablet Take 1,000 mg by mouth 2 (two) times daily.  08/12/15  Yes [provider]  polyethylene glycol (MIRALAX / GLYCOLAX) packet Take 17 g by mouth daily. Patient taking differently: Take 17 g by mouth daily as needed for moderate constipation.  09/12/15  Yes Wieting, Richard, MD  PROLIA 60 MG/ML SOSY injection Inject 60 mg into the skin every 6 (six) months. 09/13/17  Yes [provider]  senna-docusate (SENOKOT-S) 8.6-50 MG tablet Take 1 tablet by mouth at bedtime as needed for mild constipation.  09/12/15  Yes Wieting, Richard, MD  simvastatin (ZOCOR) 20 MG tablet Take 20 mg by mouth every evening.  07/11/15  Yes [provider]  tamsulosin (FLOMAX) 0.4 MG CAPS capsule Take 0.4 mg by mouth 2 (two) times daily. 08/08/15  Yes [provider]  vitamin B-12 (CYANOCOBALAMIN) 1000 MCG tablet Take 1,000 mcg daily by mouth.   Yes [provider]  insulin aspart (NOVOLOG) 100 UNIT/ML FlexPen Inject 5 Units into the skin 3 (three) times daily with meals. Patient not taking: Reported on 07/12/2016 05/19/16   Theodoro Grist, MD    Family History Family History  Problem Relation Age of Onset   CVA Father 29   Prostate cancer Neg Hx    Kidney cancer Neg Hx    Bladder Cancer Neg Hx     Social History Social History   Tobacco Use   Smoking status: Never Smoker   Smokeless tobacco: Never Used  Substance Use Topics   Alcohol use: No    Alcohol/week: 0.0 standard drinks   Drug use: No     Allergies   Patient has no known allergies.   Review of Systems Review of Systems  Unable to perform ROS: Mental status change     Physical Exam Updated Vital Signs BP (!) 140/92    Pulse (!) 138    Temp 98.6 F (37 C) (Oral)    Resp (!) 23    Ht 6\' 1"  (1.854 m)    Wt 73.6 kg    SpO2 96%    BMI 21.41 kg/m   Physical Exam Vitals signs and nursing note reviewed.  Constitutional:      Comments: Alert but combative.  HENT:     Head: Normocephalic and atraumatic.   Eyes:     General: Lids are normal.     Conjunctiva/sclera: Conjunctivae normal.     Pupils: Pupils are equal, round, and reactive to light.     Comments: PERRL  Neck:     Musculoskeletal: Full passive range of motion without pain.  Cardiovascular:     Rate and Rhythm: Normal rate and regular rhythm.     Pulses: Normal pulses.     Heart sounds: Normal heart sounds. No murmur. No friction rub. No gallop.   Pulmonary:     Effort: Pulmonary effort is normal.     Breath sounds: Normal breath sounds.     Comments: Lungs clear to auscultation bilaterally.  Symmetric chest rise.  No wheezing, rales, rhonchi. Abdominal:     Palpations: Abdomen is soft. Abdomen is not rigid.     Tenderness: There is no abdominal tenderness. There is no guarding.     Comments: Abdomen is soft, non-distended, non-tender. No rigidity, No guarding. No peritoneal signs.   Musculoskeletal: Normal range of motion.  Skin:    General: Skin is warm and dry.     Capillary Refill: Capillary refill takes less than 2 seconds.  Neurological:     Mental  Status: He is alert.     Comments: Alert Moving all extremity spontaneously Does not respond to commands.  Psychiatric:        Speech: Speech normal.      ED Treatments / Results  Labs (all labs ordered are listed, but only abnormal results are displayed) Labs Reviewed  CBC - Abnormal; Notable for the following components:      Result Value   RBC 3.84 (*)    Hemoglobin 12.3 (*)    HCT 36.8 (*)  All other components within normal limits  COMPREHENSIVE METABOLIC PANEL - Abnormal; Notable for the following components:   Sodium 132 (*)    Chloride 94 (*)    Glucose, Bld 160 (*)    All other components within normal limits  I-STAT CHEM 8, ED - Abnormal; Notable for the following components:   Sodium 131 (*)    Chloride 93 (*)    Glucose, Bld 156 (*)    Calcium, Ion 1.13 (*)    Hemoglobin 12.9 (*)    HCT 38.0 (*)    All other components within normal limits  NOVEL CORONAVIRUS, NAA (HOSPITAL ORDER, SEND-OUT TO REF LAB)  ETHANOL  PROTIME-INR  APTT  DIFFERENTIAL  RAPID URINE DRUG SCREEN, HOSP PERFORMED  URINALYSIS, ROUTINE W REFLEX MICROSCOPIC    EKG EKG Interpretation  Date/Time:  Friday October 24 2018 08:07:04 EDT Ventricular Rate:  113 PR Interval:    QRS Duration: 100 QT Interval:  328 QTC Calculation: 450 R Axis:   -57 Text Interpretation:  Sinus tachycardia LAD, consider left anterior fascicular block Artifact in lead(s) I II aVR aVL V1 Confirmed by Noemi Chapel [phone]) on 10/25/2018 8:15:17 AM   Radiology Ct Angio Head W Or Wo Contrast  Result Date: 11/08/2018 CLINICAL DATA:  Fall. Leftward gaze, right facial droop, right hemianopia, and aphasia. Subdural hematoma on earlier head CT. EXAM: CT ANGIOGRAPHY HEAD AND NECK CT CERVICAL SPINE WITHOUT CONTRAST TECHNIQUE: Multidetector CT imaging of the head and neck was performed using the standard protocol during bolus administration of intravenous contrast. Multiplanar CT image reconstructions and MIPs were obtained  to evaluate the vascular anatomy. Carotid stenosis measurements (when applicable) are obtained utilizing NASCET criteria, using the distal internal carotid diameter as the denominator. CT images of the cervical spine including coronal and sagittal reformats were reconstructed from the CTA data set. CONTRAST:  128mL OMNIPAQUE IOHEXOL 350 MG/ML SOLN COMPARISON:  Noncontrast head CT 11/04/2018 at 0733 hours. Brain MRI 05/25/2018. Head and neck CTA 11/12/2017. FINDINGS: CT HEAD FINDINGS There is moderate motion artifact through the skull base. Brain: Chronic infarcts are again seen in the right frontal lobe, bilateral basal ganglia, right thalamus, and left pons patchy bilateral cerebral white matter hypodensities are nonspecific but compatible with moderate chronic small vessel ischemic disease. There is moderate cerebral atrophy. A small subdural hematoma is again noted over the left cerebral convexity measuring up to 7 mm in thickness without midline shift or other significant mass effect. There is also a smaller subdural hematoma over the right frontotemporal convexity measuring up to 5 mm in thickness without mass effect which was obscured by motion artifact on the earlier study. Vascular: Calcified atherosclerosis at the skull base. Skull: No fracture identified within limitations of motion artifact. Mild right sided scalp swelling. Sinuses: Paranasal sinuses and mastoid air cells are clear. Orbits: Bilateral cataract extraction. Review of the MIP images confirms the above findings CTA NECK FINDINGS Aortic arch: Standard 3 vessel aortic arch with widely patent arch vessel origins. Mild arch atherosclerosis. Moderate calcified plaque in the proximal left subclavian artery not resulting in significant stenosis. Right carotid system: Patent with mild predominantly calcified plaque about the carotid bifurcation. No evidence of significant stenosis or dissection. Left carotid system: Patent with mild-to-moderate  predominantly calcified plaque at the carotid bifurcation and in the carotid bulb. No evidence of significant stenosis or dissection. Vertebral arteries: Patent with the left being mildly dominant. Unchanged calcified plaque at the left vertebral artery origin resulting in mild stenosis. Skeleton: No  acute osseous abnormality identified. Detailed cervical spine assessment as reported below. Other neck: No evidence of cervical lymphadenopathy or mass. Upper chest: Clear lung apices. Review of the MIP images confirms the above findings CTA HEAD FINDINGS Anterior circulation: The internal carotid arteries are patent from skull base to carotid termini with atherosclerotic plaque resulting in mild bilateral paraclinoid stenosis, stable to mildly progressed. ACAs and MCAs are patent without evidence of significant A1 or M1 stenosis. Branch vessel assessment is mildly to moderately limited by motion artifact, however no proximal branch occlusion is identified. No aneurysm is seen. Posterior circulation: The intracranial vertebral arteries are patent to the basilar with mild atherosclerotic irregularity resulting in less than 50% narrowing on the right. The basilar artery is widely patent. Posterior communicating arteries are not identified and may be diminutive or absent. The PCAs are patent with bilateral proximal P2 stenoses, severe on the right and moderate on the left and which appear to have mildly progressed from the prior CTA although motion artifact mildly limits assessment. There is also a severe distal right P2 stenosis which is new. No aneurysm is identified. Venous sinuses: Poorly evaluated due to arterial contrast timing. Anatomic variants: None. Review of the MIP images confirms the above findings CT CERVICAL SPINE FINDINGS Advanced facet degeneration in the cervical spine with trace anterolisthesis of C3 on C4 and 3 mm anterolisthesis of C4 on C5, C5 on C6, and C7 on T1. Multilevel disc degeneration, severe  at C6-7. Moderate to severe multilevel neural foraminal stenosis. No evidence of significant osseous spinal canal stenosis. No acute fracture identified. No visible spinal canal hematoma or prevertebral fluid. IMPRESSION: HEAD CT: 1. Small bilateral subdural hematomas, left larger than right. No mass effect. 2. Chronic ischemic changes with multiple old infarcts as above. HEAD AND NECK CTA: 1. No emergent large vessel occlusion. 2. Progressive intracranial atherosclerosis including severe right and moderate left proximal P2 stenoses. 3. Widely patent cervical carotid arteries. 4. Unchanged mild left vertebral artery origin stenosis. 5.  Aortic Atherosclerosis (ICD10-I70.0). CERVICAL SPINE CT: 1. No acute cervical spine fracture. 2. Advanced cervical disc and facet degeneration. Electronically Signed   By: Logan Bores M.D.   On: 11/03/2018 08:48   Ct Angio Neck W Or Wo Contrast  Result Date: 10/15/2018 CLINICAL DATA:  Fall. Leftward gaze, right facial droop, right hemianopia, and aphasia. Subdural hematoma on earlier head CT. EXAM: CT ANGIOGRAPHY HEAD AND NECK CT CERVICAL SPINE WITHOUT CONTRAST TECHNIQUE: Multidetector CT imaging of the head and neck was performed using the standard protocol during bolus administration of intravenous contrast. Multiplanar CT image reconstructions and MIPs were obtained to evaluate the vascular anatomy. Carotid stenosis measurements (when applicable) are obtained utilizing NASCET criteria, using the distal internal carotid diameter as the denominator. CT images of the cervical spine including coronal and sagittal reformats were reconstructed from the CTA data set. CONTRAST:  195mL OMNIPAQUE IOHEXOL 350 MG/ML SOLN COMPARISON:  Noncontrast head CT 10/26/2018 at 0733 hours. Brain MRI 05/25/2018. Head and neck CTA 11/12/2017. FINDINGS: CT HEAD FINDINGS There is moderate motion artifact through the skull base. Brain: Chronic infarcts are again seen in the right frontal lobe, bilateral  basal ganglia, right thalamus, and left pons patchy bilateral cerebral white matter hypodensities are nonspecific but compatible with moderate chronic small vessel ischemic disease. There is moderate cerebral atrophy. A small subdural hematoma is again noted over the left cerebral convexity measuring up to 7 mm in thickness without midline shift or other significant mass effect. There is  also a smaller subdural hematoma over the right frontotemporal convexity measuring up to 5 mm in thickness without mass effect which was obscured by motion artifact on the earlier study. Vascular: Calcified atherosclerosis at the skull base. Skull: No fracture identified within limitations of motion artifact. Mild right sided scalp swelling. Sinuses: Paranasal sinuses and mastoid air cells are clear. Orbits: Bilateral cataract extraction. Review of the MIP images confirms the above findings CTA NECK FINDINGS Aortic arch: Standard 3 vessel aortic arch with widely patent arch vessel origins. Mild arch atherosclerosis. Moderate calcified plaque in the proximal left subclavian artery not resulting in significant stenosis. Right carotid system: Patent with mild predominantly calcified plaque about the carotid bifurcation. No evidence of significant stenosis or dissection. Left carotid system: Patent with mild-to-moderate predominantly calcified plaque at the carotid bifurcation and in the carotid bulb. No evidence of significant stenosis or dissection. Vertebral arteries: Patent with the left being mildly dominant. Unchanged calcified plaque at the left vertebral artery origin resulting in mild stenosis. Skeleton: No acute osseous abnormality identified. Detailed cervical spine assessment as reported below. Other neck: No evidence of cervical lymphadenopathy or mass. Upper chest: Clear lung apices. Review of the MIP images confirms the above findings CTA HEAD FINDINGS Anterior circulation: The internal carotid arteries are patent from  skull base to carotid termini with atherosclerotic plaque resulting in mild bilateral paraclinoid stenosis, stable to mildly progressed. ACAs and MCAs are patent without evidence of significant A1 or M1 stenosis. Branch vessel assessment is mildly to moderately limited by motion artifact, however no proximal branch occlusion is identified. No aneurysm is seen. Posterior circulation: The intracranial vertebral arteries are patent to the basilar with mild atherosclerotic irregularity resulting in less than 50% narrowing on the right. The basilar artery is widely patent. Posterior communicating arteries are not identified and may be diminutive or absent. The PCAs are patent with bilateral proximal P2 stenoses, severe on the right and moderate on the left and which appear to have mildly progressed from the prior CTA although motion artifact mildly limits assessment. There is also a severe distal right P2 stenosis which is new. No aneurysm is identified. Venous sinuses: Poorly evaluated due to arterial contrast timing. Anatomic variants: None. Review of the MIP images confirms the above findings CT CERVICAL SPINE FINDINGS Advanced facet degeneration in the cervical spine with trace anterolisthesis of C3 on C4 and 3 mm anterolisthesis of C4 on C5, C5 on C6, and C7 on T1. Multilevel disc degeneration, severe at C6-7. Moderate to severe multilevel neural foraminal stenosis. No evidence of significant osseous spinal canal stenosis. No acute fracture identified. No visible spinal canal hematoma or prevertebral fluid. IMPRESSION: HEAD CT: 1. Small bilateral subdural hematomas, left larger than right. No mass effect. 2. Chronic ischemic changes with multiple old infarcts as above. HEAD AND NECK CTA: 1. No emergent large vessel occlusion. 2. Progressive intracranial atherosclerosis including severe right and moderate left proximal P2 stenoses. 3. Widely patent cervical carotid arteries. 4. Unchanged mild left vertebral artery  origin stenosis. 5.  Aortic Atherosclerosis (ICD10-I70.0). CERVICAL SPINE CT: 1. No acute cervical spine fracture. 2. Advanced cervical disc and facet degeneration. Electronically Signed   By: Logan Bores M.D.   On: 10/22/2018 08:48   Ct C-spine No Charge  Result Date: 10/15/2018 CLINICAL DATA:  Fall. Leftward gaze, right facial droop, right hemianopia, and aphasia. Subdural hematoma on earlier head CT. EXAM: CT ANGIOGRAPHY HEAD AND NECK CT CERVICAL SPINE WITHOUT CONTRAST TECHNIQUE: Multidetector CT imaging of the head  and neck was performed using the standard protocol during bolus administration of intravenous contrast. Multiplanar CT image reconstructions and MIPs were obtained to evaluate the vascular anatomy. Carotid stenosis measurements (when applicable) are obtained utilizing NASCET criteria, using the distal internal carotid diameter as the denominator. CT images of the cervical spine including coronal and sagittal reformats were reconstructed from the CTA data set. CONTRAST:  173mL OMNIPAQUE IOHEXOL 350 MG/ML SOLN COMPARISON:  Noncontrast head CT 10/17/2018 at 0733 hours. Brain MRI 05/25/2018. Head and neck CTA 11/12/2017. FINDINGS: CT HEAD FINDINGS There is moderate motion artifact through the skull base. Brain: Chronic infarcts are again seen in the right frontal lobe, bilateral basal ganglia, right thalamus, and left pons patchy bilateral cerebral white matter hypodensities are nonspecific but compatible with moderate chronic small vessel ischemic disease. There is moderate cerebral atrophy. A small subdural hematoma is again noted over the left cerebral convexity measuring up to 7 mm in thickness without midline shift or other significant mass effect. There is also a smaller subdural hematoma over the right frontotemporal convexity measuring up to 5 mm in thickness without mass effect which was obscured by motion artifact on the earlier study. Vascular: Calcified atherosclerosis at the skull base.  Skull: No fracture identified within limitations of motion artifact. Mild right sided scalp swelling. Sinuses: Paranasal sinuses and mastoid air cells are clear. Orbits: Bilateral cataract extraction. Review of the MIP images confirms the above findings CTA NECK FINDINGS Aortic arch: Standard 3 vessel aortic arch with widely patent arch vessel origins. Mild arch atherosclerosis. Moderate calcified plaque in the proximal left subclavian artery not resulting in significant stenosis. Right carotid system: Patent with mild predominantly calcified plaque about the carotid bifurcation. No evidence of significant stenosis or dissection. Left carotid system: Patent with mild-to-moderate predominantly calcified plaque at the carotid bifurcation and in the carotid bulb. No evidence of significant stenosis or dissection. Vertebral arteries: Patent with the left being mildly dominant. Unchanged calcified plaque at the left vertebral artery origin resulting in mild stenosis. Skeleton: No acute osseous abnormality identified. Detailed cervical spine assessment as reported below. Other neck: No evidence of cervical lymphadenopathy or mass. Upper chest: Clear lung apices. Review of the MIP images confirms the above findings CTA HEAD FINDINGS Anterior circulation: The internal carotid arteries are patent from skull base to carotid termini with atherosclerotic plaque resulting in mild bilateral paraclinoid stenosis, stable to mildly progressed. ACAs and MCAs are patent without evidence of significant A1 or M1 stenosis. Branch vessel assessment is mildly to moderately limited by motion artifact, however no proximal branch occlusion is identified. No aneurysm is seen. Posterior circulation: The intracranial vertebral arteries are patent to the basilar with mild atherosclerotic irregularity resulting in less than 50% narrowing on the right. The basilar artery is widely patent. Posterior communicating arteries are not identified and may  be diminutive or absent. The PCAs are patent with bilateral proximal P2 stenoses, severe on the right and moderate on the left and which appear to have mildly progressed from the prior CTA although motion artifact mildly limits assessment. There is also a severe distal right P2 stenosis which is new. No aneurysm is identified. Venous sinuses: Poorly evaluated due to arterial contrast timing. Anatomic variants: None. Review of the MIP images confirms the above findings CT CERVICAL SPINE FINDINGS Advanced facet degeneration in the cervical spine with trace anterolisthesis of C3 on C4 and 3 mm anterolisthesis of C4 on C5, C5 on C6, and C7 on T1. Multilevel disc degeneration, severe at  C6-7. Moderate to severe multilevel neural foraminal stenosis. No evidence of significant osseous spinal canal stenosis. No acute fracture identified. No visible spinal canal hematoma or prevertebral fluid. IMPRESSION: HEAD CT: 1. Small bilateral subdural hematomas, left larger than right. No mass effect. 2. Chronic ischemic changes with multiple old infarcts as above. HEAD AND NECK CTA: 1. No emergent large vessel occlusion. 2. Progressive intracranial atherosclerosis including severe right and moderate left proximal P2 stenoses. 3. Widely patent cervical carotid arteries. 4. Unchanged mild left vertebral artery origin stenosis. 5.  Aortic Atherosclerosis (ICD10-I70.0). CERVICAL SPINE CT: 1. No acute cervical spine fracture. 2. Advanced cervical disc and facet degeneration. Electronically Signed   By: Logan Bores M.D.   On: 11/07/2018 08:48   Ct Head Code Stroke Wo Contrast  Result Date: 11/07/2018 CLINICAL DATA:  Code stroke.  Altered mental status. EXAM: CT HEAD WITHOUT CONTRAST TECHNIQUE: Contiguous axial images were obtained from the base of the skull through the vertex without intravenous contrast. COMPARISON:  MRI brain 05/25/2018 FINDINGS: Brain: The study is severely degraded by patient motion. Remote infarcts involving the  right greater than left basal ganglia and bilateral corona radiata are again noted. Remote right frontal lobe infarct is noted. Left subdural hematoma is suspected. There is no significant midline shift. No definite posterior fossa lesions are present. The ventricles are proportionate to the degree of atrophy. Ventricle size is stable. Vascular: Motion obscures evaluation of the vessels. Skull: Motion decreases sensitivity for skull lesions. No definite fractures are present. Sinuses/Orbits: No significant sinus disease is evident. ASPECTS [facility] Stroke Program Early CT Score) - Ganglionic level infarction (caudate, lentiform nuclei, internal capsule, insula, M1-M3 cortex): A 7/7 - Supraganglionic infarction (M4-M6 cortex): 3/3 Total score (0-10 with 10 being normal): 10/10 IMPRESSION: 1. Small left subdural hematoma without significant midline shift. 2. Multiple remote infarcts are stable. 3. The study is severely degraded by patient motion. 4. No acute infarcts are evident. 5. ASPECTS is 10/10 These results were called by telephone at the time of interpretation on 10/25/2018 at 7:53 am to Dr. Leonel Ramsay, who verbally acknowledged these results. Electronically Signed   By: San Morelle M.D.   On: 10/27/2018 07:54    Procedures .Critical Care Performed by: Volanda Napoleon, PA-C Authorized by: Volanda Napoleon, PA-C   Critical care provider statement:    Critical care time (minutes):  45   Critical care was necessary to treat or prevent imminent or life-threatening deterioration of the following conditions:  CNS failure or compromise   Critical care was time spent personally by me on the following activities:  Discussions with consultants, evaluation of patient's response to treatment, examination of patient, ordering and performing treatments and interventions, ordering and review of laboratory studies, ordering and review of radiographic studies, pulse oximetry, re-evaluation of patient's  condition, obtaining history from patient or surrogate and review of old charts   (including critical care time)  Medications Ordered in ED Medications  nicardipine (CARDENE) 20mg  in 0.86% saline 211ml IV infusion (0.1 mg/ml) (0 mg/hr Intravenous Stopped 10/21/2018 1010)  0.9 %  sodium chloride infusion ( Intravenous Rate/Dose Change 10/29/2018 1011)  metoprolol tartrate (LOPRESSOR) injection 5 mg (has no administration in time range)  LORazepam (ATIVAN) 2 MG/ML injection (2 mg  Given 10/28/2018 0738)  iohexol (OMNIPAQUE) 350 MG/ML injection 100 mL (100 mLs Intravenous Contrast Given 10/29/2018 0803)     Initial Impression / Assessment and Plan / ED Course  I have reviewed the triage vital signs and the nursing  notes.  Pertinent labs & imaging results that were available during my care of the patient were reviewed by me and considered in my medical decision making (see chart for details).        85 year old male presents for evaluation of possible code stroke initially.  He had a fall at home and started becoming less verbal and more combative after fall.  He is on Plavix and aspirin.  Much of history is provided by EMS.  On initial ED arrival, he is afebrile, tachycardic and hypertensive.  Initially, he was concerned for possible stroke.  Neuro was consulted and patient was sent to CT scanner immediately on ED arrival.  Chem-8 shows DM of 131, BUN of 12, creatinine of 0.70.  CBC shows no leukocytosis.  Hemoglobin 12.3.  INR is 1.1.   CT Head shows small left subdural with no midline shift.  Plan for neurosurgery involvement.  Discussed with neurosurgery.  They will plan for consult.  Agrees with plan for blood pressure control with systolic blood pressure goal of 140.  Also neurosurgery after evaluation.  Patient is not recommended for surgical intervention at this time.  They will plan to consult.  Recommend medical versus from admission.  Updated patient's wife on plan.  She does want to keep  patient DNR.  She agrees that she does not want to pursue surgical intervention at this time.  She asked that she be kept updated on plan.  Discussed patient with Dr. Grandville Silos (Trauma).  Accept patient for admission.  Portions of this note were generated with [employer]obby[employer]. Dictation errors may occur despite best attempts at proofreading.  Final Clinical Impressions(s) / ED Diagnoses   Final diagnoses:  Subdural hematoma [facility])    ED Discharge Orders    None       Desma Mcgregor 10/19/2018 1033    Noemi Chapel, MD 10/25/18 [phone]

## 2018-10-24 NOTE — ED Notes (Signed)
ED TO INPATIENT HANDOFF REPORT  ED Nurse Name and Phone #: Harriette Bouillon [phone]  S Name/Age/Gender Brian Barry 85 y.o. male Room/Bed: 006C/006C  Code Status   Code Status: DNR  Home/SNF/Other Home Patient oriented to: situationnot oriented  Is this baseline? no  Triage Complete: Triage complete  Chief Complaint Code STROKE  Triage Note BIB EMS from home. Pt fell this am around 530. Pt Became less verbal and combative after fall. Pt on thinners.   Allergies No Known Allergies  Level of Care/Admitting Diagnosis ED Disposition    ED Disposition Condition [facility]: [place] [[id number]]  Level of Care: Progressive [102]  Covid Evaluation: Screening Protocol (No Symptoms)  Diagnosis: SDH (subdural hematoma) ([place]) [[id number]]  Admitting Physician: Georganna Skeans [[id number]]  Attending Physician: TRAUMA MD [[id number]]  Estimated length of stay: 5 - 7 days  Certification:: I certify this patient will need inpatient services for at least 2 midnights  Bed request comments: 4NP  PT Class (Do Not Modify): Inpatient [101]  PT Acc Code (Do Not Modify): Private [1]       B Medical/Surgery History Past Medical History:  Diagnosis Date  . Arthritis   . BPH (benign prostatic hyperplasia)   . DM (diabetes mellitus) ([place])   . Hyperlipidemia   . Hypertension   . Hypothyroidism   . Osteoporosis   . Pelvic fracture ([place])   . TIA (transient ischemic attack) 1998, 2014   Past Surgical History:  Procedure Laterality Date  . bilateral hip replacement Bilateral 1998  . Left knee replacement Left 2007  . LUMBAR LAMINECTOMY  2010  . right hip revision Right 2008     A IV Location/Drains/Wounds Patient Lines/Drains/Airways Status   Active Line/Drains/Airways    Name:   Placement date:   Placement time:   Site:   Days:   Peripheral IV 10/19/2018 Left Antecubital   10/19/2018    0807    Antecubital   less than 1          Intake/Output Last 24  hours  Intake/Output Summary (Last 24 hours) at 10/31/2018 1846 Last data filed at 11/04/2018 1010 Gross per 24 hour  Intake 60.23 ml  Output -  Net 60.23 ml    Labs/Imaging Results for orders placed or performed during the hospital encounter of 10/16/2018 (from the past 48 hour(s))  Ethanol     Status: None   Collection Time: 10/20/2018  7:28 AM  Result Value Ref Range   Alcohol, Ethyl (B) <10 <10 mg/dL    Comment: (NOTE) Lowest detectable limit for serum alcohol is 10 mg/dL. For medical purposes only. Performed at [facility], [place] [address] [place], [place] [address]   Protime-INR     Status: None   Collection Time: 10/20/2018  7:28 AM  Result Value Ref Range   Prothrombin Time 13.6 11.4 - 15.2 seconds   INR 1.1 0.8 - 1.2    Comment: (NOTE) INR goal varies based on device and disease states. Performed at [facility], [place] [address], [place], [place] [address]   APTT     Status: None   Collection Time: 10/23/2018  7:28 AM  Result Value Ref Range   aPTT 32 24 - 36 seconds    Comment: Performed at [facility] [address] [place], [place] [address]  CBC     Status: Abnormal   Collection Time: 10/13/2018  7:28 AM  Result Value  Ref Range   WBC 6.3 4.0 - 10.5 K/uL   RBC 3.84 (L) 4.22 - 5.81 MIL/uL   Hemoglobin 12.3 (L) 13.0 - 17.0 g/dL   HCT 36.8 (L) 39.0 - 52.0 %   MCV 95.8 80.0 - 100.0 fL   MCH 32.0 26.0 - 34.0 pg   MCHC 33.4 30.0 - 36.0 g/dL   RDW 12.1 11.5 - 15.5 %   Platelets 192 150 - 400 K/uL   nRBC 0.0 0.0 - 0.2 %    Comment: Performed at [facility], [place] [address], [place], [place] [address]  Differential     Status: None   Collection Time: 10/19/2018  7:28 AM  Result Value Ref Range   Neutrophils Relative % 68 %   Neutro Abs 4.3 1.7 - 7.7 K/uL   Lymphocytes Relative 18 %   Lymphs Abs 1.1 0.7 - 4.0 K/uL   Monocytes Relative 10 %   Monocytes Absolute 0.7 0.1 - 1.0 K/uL   Eosinophils Relative 2 %   Eosinophils Absolute 0.1 0.0 - 0.5  K/uL   Basophils Relative 1 %   Basophils Absolute 0.0 0.0 - 0.1 K/uL   Immature Granulocytes 1 %   Abs Immature Granulocytes 0.03 0.00 - 0.07 K/uL    Comment: Performed at [facility] [address] [place], [place] [address]  Comprehensive metabolic panel     Status: Abnormal   Collection Time: 10/15/2018  7:28 AM  Result Value Ref Range   Sodium 132 (L) 135 - 145 mmol/L   Potassium 4.1 3.5 - 5.1 mmol/L   Chloride 94 (L) 98 - 111 mmol/L   CO2 25 22 - 32 mmol/L   Glucose, Bld 160 (H) 70 - 99 mg/dL   BUN 11 8 - 23 mg/dL   Creatinine, Ser 0.92 0.61 - 1.24 mg/dL   Calcium 9.4 8.9 - 10.3 mg/dL   Total Protein 6.8 6.5 - 8.1 g/dL   Albumin 4.3 3.5 - 5.0 g/dL   AST 29 15 - 41 U/L   ALT 14 0 - 44 U/L   Alkaline Phosphatase 50 38 - 126 U/L   Total Bilirubin 0.5 0.3 - 1.2 mg/dL   GFR calc non Af Amer >60 >60 mL/min   GFR calc Af Amer >60 >60 mL/min   Anion gap 13 5 - 15    Comment: Performed at [facility], [place] [address] [place], [place] [address]  I-stat chem 8, ED     Status: Abnormal   Collection Time: 10/18/2018  7:32 AM  Result Value Ref Range   Sodium 131 (L) 135 - 145 mmol/L   Potassium 4.1 3.5 - 5.1 mmol/L   Chloride 93 (L) 98 - 111 mmol/L   BUN 12 8 - 23 mg/dL   Creatinine, Ser 0.70 0.61 - 1.24 mg/dL   Glucose, Bld 156 (H) 70 - 99 mg/dL   Calcium, Ion 1.13 (L) 1.15 - 1.40 mmol/L   TCO2 28 22 - 32 mmol/L   Hemoglobin 12.9 (L) 13.0 - 17.0 g/dL   HCT 38.0 (L) 39.0 - 52.0 %  CBG monitoring, ED     Status: Abnormal   Collection Time: 11/02/2018  6:43 PM  Result Value Ref Range   Glucose-Capillary 287 (H) 70 - 99 mg/dL   Ct Angio Head W Or Wo Contrast  Result Date:  CLINICAL DATA:  Fall. Leftward gaze, right facial droop, right hemianopia, and aphasia. Subdural hematoma on earlier head CT. EXAM: CT ANGIOGRAPHY  HEAD AND NECK CT CERVICAL SPINE WITHOUT CONTRAST TECHNIQUE: Multidetector CT imaging of the head and neck was performed using the standard  protocol during bolus administration of intravenous contrast. Multiplanar CT image reconstructions and MIPs were obtained to evaluate the vascular anatomy. Carotid stenosis measurements (when applicable) are obtained utilizing NASCET criteria, using the distal internal carotid diameter as the denominator. CT images of the cervical spine including coronal and sagittal reformats were reconstructed from the CTA data set. CONTRAST:  142mL OMNIPAQUE IOHEXOL 350 MG/ML SOLN COMPARISON:  Noncontrast head CT 11/09/2018 at 0733 hours. Brain MRI 05/25/2018. Head and neck CTA 11/12/2017. FINDINGS: CT HEAD FINDINGS There is moderate motion artifact through the skull base. Brain: Chronic infarcts are again seen in the right frontal lobe, bilateral basal ganglia, right thalamus, and left pons patchy bilateral cerebral white matter hypodensities are nonspecific but compatible with moderate chronic small vessel ischemic disease. There is moderate cerebral atrophy. A small subdural hematoma is again noted over the left cerebral convexity measuring up to 7 mm in thickness without midline shift or other significant mass effect. There is also a smaller subdural hematoma over the right frontotemporal convexity measuring up to 5 mm in thickness without mass effect which was obscured by motion artifact on the earlier study. Vascular: Calcified atherosclerosis at the skull base. Skull: No fracture identified within limitations of motion artifact. Mild right sided scalp swelling. Sinuses: Paranasal sinuses and mastoid air cells are clear. Orbits: Bilateral cataract extraction. Review of the MIP images confirms the above findings CTA NECK FINDINGS Aortic arch: Standard 3 vessel aortic arch with widely patent arch vessel origins. Mild arch atherosclerosis. Moderate calcified plaque in the proximal left subclavian artery not resulting in significant stenosis. Right carotid system: Patent with mild predominantly calcified plaque about the  carotid bifurcation. No evidence of significant stenosis or dissection. Left carotid system: Patent with mild-to-moderate predominantly calcified plaque at the carotid bifurcation and in the carotid bulb. No evidence of significant stenosis or dissection. Vertebral arteries: Patent with the left being mildly dominant. Unchanged calcified plaque at the left vertebral artery origin resulting in mild stenosis. Skeleton: No acute osseous abnormality identified. Detailed cervical spine assessment as reported below. Other neck: No evidence of cervical lymphadenopathy or mass. Upper chest: Clear lung apices. Review of the MIP images confirms the above findings CTA HEAD FINDINGS Anterior circulation: The internal carotid arteries are patent from skull base to carotid termini with atherosclerotic plaque resulting in mild bilateral paraclinoid stenosis, stable to mildly progressed. ACAs and MCAs are patent without evidence of significant A1 or M1 stenosis. Branch vessel assessment is mildly to moderately limited by motion artifact, however no proximal branch occlusion is identified. No aneurysm is seen. Posterior circulation: The intracranial vertebral arteries are patent to the basilar with mild atherosclerotic irregularity resulting in less than 50% narrowing on the right. The basilar artery is widely patent. Posterior communicating arteries are not identified and may be diminutive or absent. The PCAs are patent with bilateral proximal P2 stenoses, severe on the right and moderate on the left and which appear to have mildly progressed from the prior CTA although motion artifact mildly limits assessment. There is also a severe distal right P2 stenosis which is new. No aneurysm is identified. Venous sinuses: Poorly evaluated due to arterial contrast timing. Anatomic variants: None. Review of the MIP images confirms the above findings CT CERVICAL SPINE FINDINGS Advanced facet degeneration in the cervical spine with trace  anterolisthesis of C3 on C4 and 3 mm anterolisthesis  of C4 on C5, C5 on C6, and C7 on T1. Multilevel disc degeneration, severe at C6-7. Moderate to severe multilevel neural foraminal stenosis. No evidence of significant osseous spinal canal stenosis. No acute fracture identified. No visible spinal canal hematoma or prevertebral fluid. IMPRESSION: HEAD CT: 1. Small bilateral subdural hematomas, left larger than right. No mass effect. 2. Chronic ischemic changes with multiple old infarcts as above. HEAD AND NECK CTA: 1. No emergent large vessel occlusion. 2. Progressive intracranial atherosclerosis including severe right and moderate left proximal P2 stenoses. 3. Widely patent cervical carotid arteries. 4. Unchanged mild left vertebral artery origin stenosis. 5.  Aortic Atherosclerosis (ICD10-I70.0). CERVICAL SPINE CT: 1. No acute cervical spine fracture. 2. Advanced cervical disc and facet degeneration. Electronically Signed   By: Logan Bores M.D.   On: 11/09/2018 08:48   Ct Angio Neck W Or Wo Contrast  Result Date: 10/18/2018 CLINICAL DATA:  Fall. Leftward gaze, right facial droop, right hemianopia, and aphasia. Subdural hematoma on earlier head CT. EXAM: CT ANGIOGRAPHY HEAD AND NECK CT CERVICAL SPINE WITHOUT CONTRAST TECHNIQUE: Multidetector CT imaging of the head and neck was performed using the standard protocol during bolus administration of intravenous contrast. Multiplanar CT image reconstructions and MIPs were obtained to evaluate the vascular anatomy. Carotid stenosis measurements (when applicable) are obtained utilizing NASCET criteria, using the distal internal carotid diameter as the denominator. CT images of the cervical spine including coronal and sagittal reformats were reconstructed from the CTA data set. CONTRAST:  130mL OMNIPAQUE IOHEXOL 350 MG/ML SOLN COMPARISON:  Noncontrast head CT 10/13/2018 at 0733 hours. Brain MRI 05/25/2018. Head and neck CTA 11/12/2017. FINDINGS: CT HEAD FINDINGS There  is moderate motion artifact through the skull base. Brain: Chronic infarcts are again seen in the right frontal lobe, bilateral basal ganglia, right thalamus, and left pons patchy bilateral cerebral white matter hypodensities are nonspecific but compatible with moderate chronic small vessel ischemic disease. There is moderate cerebral atrophy. A small subdural hematoma is again noted over the left cerebral convexity measuring up to 7 mm in thickness without midline shift or other significant mass effect. There is also a smaller subdural hematoma over the right frontotemporal convexity measuring up to 5 mm in thickness without mass effect which was obscured by motion artifact on the earlier study. Vascular: Calcified atherosclerosis at the skull base. Skull: No fracture identified within limitations of motion artifact. Mild right sided scalp swelling. Sinuses: Paranasal sinuses and mastoid air cells are clear. Orbits: Bilateral cataract extraction. Review of the MIP images confirms the above findings CTA NECK FINDINGS Aortic arch: Standard 3 vessel aortic arch with widely patent arch vessel origins. Mild arch atherosclerosis. Moderate calcified plaque in the proximal left subclavian artery not resulting in significant stenosis. Right carotid system: Patent with mild predominantly calcified plaque about the carotid bifurcation. No evidence of significant stenosis or dissection. Left carotid system: Patent with mild-to-moderate predominantly calcified plaque at the carotid bifurcation and in the carotid bulb. No evidence of significant stenosis or dissection. Vertebral arteries: Patent with the left being mildly dominant. Unchanged calcified plaque at the left vertebral artery origin resulting in mild stenosis. Skeleton: No acute osseous abnormality identified. Detailed cervical spine assessment as reported below. Other neck: No evidence of cervical lymphadenopathy or mass. Upper chest: Clear lung apices. Review of the  MIP images confirms the above findings CTA HEAD FINDINGS Anterior circulation: The internal carotid arteries are patent from skull base to carotid termini with atherosclerotic plaque resulting in mild bilateral paraclinoid stenosis,  stable to mildly progressed. ACAs and MCAs are patent without evidence of significant A1 or M1 stenosis. Branch vessel assessment is mildly to moderately limited by motion artifact, however no proximal branch occlusion is identified. No aneurysm is seen. Posterior circulation: The intracranial vertebral arteries are patent to the basilar with mild atherosclerotic irregularity resulting in less than 50% narrowing on the right. The basilar artery is widely patent. Posterior communicating arteries are not identified and may be diminutive or absent. The PCAs are patent with bilateral proximal P2 stenoses, severe on the right and moderate on the left and which appear to have mildly progressed from the prior CTA although motion artifact mildly limits assessment. There is also a severe distal right P2 stenosis which is new. No aneurysm is identified. Venous sinuses: Poorly evaluated due to arterial contrast timing. Anatomic variants: None. Review of the MIP images confirms the above findings CT CERVICAL SPINE FINDINGS Advanced facet degeneration in the cervical spine with trace anterolisthesis of C3 on C4 and 3 mm anterolisthesis of C4 on C5, C5 on C6, and C7 on T1. Multilevel disc degeneration, severe at C6-7. Moderate to severe multilevel neural foraminal stenosis. No evidence of significant osseous spinal canal stenosis. No acute fracture identified. No visible spinal canal hematoma or prevertebral fluid. IMPRESSION: HEAD CT: 1. Small bilateral subdural hematomas, left larger than right. No mass effect. 2. Chronic ischemic changes with multiple old infarcts as above. HEAD AND NECK CTA: 1. No emergent large vessel occlusion. 2. Progressive intracranial atherosclerosis including severe right  and moderate left proximal P2 stenoses. 3. Widely patent cervical carotid arteries. 4. Unchanged mild left vertebral artery origin stenosis. 5.  Aortic Atherosclerosis (ICD10-I70.0). CERVICAL SPINE CT: 1. No acute cervical spine fracture. 2. Advanced cervical disc and facet degeneration. Electronically Signed   By: Logan Bores M.D.   On: 10/22/2018 08:48   Ct C-spine No Charge  Result Date: 11/05/2018 CLINICAL DATA:  Fall. Leftward gaze, right facial droop, right hemianopia, and aphasia. Subdural hematoma on earlier head CT. EXAM: CT ANGIOGRAPHY HEAD AND NECK CT CERVICAL SPINE WITHOUT CONTRAST TECHNIQUE: Multidetector CT imaging of the head and neck was performed using the standard protocol during bolus administration of intravenous contrast. Multiplanar CT image reconstructions and MIPs were obtained to evaluate the vascular anatomy. Carotid stenosis measurements (when applicable) are obtained utilizing NASCET criteria, using the distal internal carotid diameter as the denominator. CT images of the cervical spine including coronal and sagittal reformats were reconstructed from the CTA data set. CONTRAST:  143mL OMNIPAQUE IOHEXOL 350 MG/ML SOLN COMPARISON:  Noncontrast head CT 10/13/2018 at 0733 hours. Brain MRI 05/25/2018. Head and neck CTA 11/12/2017. FINDINGS: CT HEAD FINDINGS There is moderate motion artifact through the skull base. Brain: Chronic infarcts are again seen in the right frontal lobe, bilateral basal ganglia, right thalamus, and left pons patchy bilateral cerebral white matter hypodensities are nonspecific but compatible with moderate chronic small vessel ischemic disease. There is moderate cerebral atrophy. A small subdural hematoma is again noted over the left cerebral convexity measuring up to 7 mm in thickness without midline shift or other significant mass effect. There is also a smaller subdural hematoma over the right frontotemporal convexity measuring up to 5 mm in thickness without  mass effect which was obscured by motion artifact on the earlier study. Vascular: Calcified atherosclerosis at the skull base. Skull: No fracture identified within limitations of motion artifact. Mild right sided scalp swelling. Sinuses: Paranasal sinuses and mastoid air cells are clear. Orbits: Bilateral cataract  extraction. Review of the MIP images confirms the above findings CTA NECK FINDINGS Aortic arch: Standard 3 vessel aortic arch with widely patent arch vessel origins. Mild arch atherosclerosis. Moderate calcified plaque in the proximal left subclavian artery not resulting in significant stenosis. Right carotid system: Patent with mild predominantly calcified plaque about the carotid bifurcation. No evidence of significant stenosis or dissection. Left carotid system: Patent with mild-to-moderate predominantly calcified plaque at the carotid bifurcation and in the carotid bulb. No evidence of significant stenosis or dissection. Vertebral arteries: Patent with the left being mildly dominant. Unchanged calcified plaque at the left vertebral artery origin resulting in mild stenosis. Skeleton: No acute osseous abnormality identified. Detailed cervical spine assessment as reported below. Other neck: No evidence of cervical lymphadenopathy or mass. Upper chest: Clear lung apices. Review of the MIP images confirms the above findings CTA HEAD FINDINGS Anterior circulation: The internal carotid arteries are patent from skull base to carotid termini with atherosclerotic plaque resulting in mild bilateral paraclinoid stenosis, stable to mildly progressed. ACAs and MCAs are patent without evidence of significant A1 or M1 stenosis. Branch vessel assessment is mildly to moderately limited by motion artifact, however no proximal branch occlusion is identified. No aneurysm is seen. Posterior circulation: The intracranial vertebral arteries are patent to the basilar with mild atherosclerotic irregularity resulting in less  than 50% narrowing on the right. The basilar artery is widely patent. Posterior communicating arteries are not identified and may be diminutive or absent. The PCAs are patent with bilateral proximal P2 stenoses, severe on the right and moderate on the left and which appear to have mildly progressed from the prior CTA although motion artifact mildly limits assessment. There is also a severe distal right P2 stenosis which is new. No aneurysm is identified. Venous sinuses: Poorly evaluated due to arterial contrast timing. Anatomic variants: None. Review of the MIP images confirms the above findings CT CERVICAL SPINE FINDINGS Advanced facet degeneration in the cervical spine with trace anterolisthesis of C3 on C4 and 3 mm anterolisthesis of C4 on C5, C5 on C6, and C7 on T1. Multilevel disc degeneration, severe at C6-7. Moderate to severe multilevel neural foraminal stenosis. No evidence of significant osseous spinal canal stenosis. No acute fracture identified. No visible spinal canal hematoma or prevertebral fluid. IMPRESSION: HEAD CT: 1. Small bilateral subdural hematomas, left larger than right. No mass effect. 2. Chronic ischemic changes with multiple old infarcts as above. HEAD AND NECK CTA: 1. No emergent large vessel occlusion. 2. Progressive intracranial atherosclerosis including severe right and moderate left proximal P2 stenoses. 3. Widely patent cervical carotid arteries. 4. Unchanged mild left vertebral artery origin stenosis. 5.  Aortic Atherosclerosis (ICD10-I70.0). CERVICAL SPINE CT: 1. No acute cervical spine fracture. 2. Advanced cervical disc and facet degeneration. Electronically Signed   By: Logan Bores M.D.   On: 10/20/2018 08:48   Ct Head Code Stroke Wo Contrast  Result Date: 11/01/2018 CLINICAL DATA:  Code stroke.  Altered mental status. EXAM: CT HEAD WITHOUT CONTRAST TECHNIQUE: Contiguous axial images were obtained from the base of the skull through the vertex without intravenous contrast.  COMPARISON:  MRI brain 05/25/2018 FINDINGS: Brain: The study is severely degraded by patient motion. Remote infarcts involving the right greater than left basal ganglia and bilateral corona radiata are again noted. Remote right frontal lobe infarct is noted. Left subdural hematoma is suspected. There is no significant midline shift. No definite posterior fossa lesions are present. The ventricles are proportionate to the degree of atrophy.  Ventricle size is stable. Vascular: Motion obscures evaluation of the vessels. Skull: Motion decreases sensitivity for skull lesions. No definite fractures are present. Sinuses/Orbits: No significant sinus disease is evident. ASPECTS [facility] Stroke Program Early CT Score) - Ganglionic level infarction (caudate, lentiform nuclei, internal capsule, insula, M1-M3 cortex): A 7/7 - Supraganglionic infarction (M4-M6 cortex): 3/3 Total score (0-10 with 10 being normal): 10/10 IMPRESSION: 1. Small left subdural hematoma without significant midline shift. 2. Multiple remote infarcts are stable. 3. The study is severely degraded by patient motion. 4. No acute infarcts are evident. 5. ASPECTS is 10/10 These results were called by telephone at the time of interpretation on 11/09/2018 at 7:53 am to Dr. Leonel Ramsay, who verbally acknowledged these results. Electronically Signed   By: San Morelle M.D.   On: 10/28/2018 07:54    Pending Labs Unresulted Labs (From admission, onward)    Start     Ordered   10/25/18 0500  CBC  Tomorrow morning,   R     11/07/2018 1500   10/25/18 6301  Basic metabolic panel  Tomorrow morning,   R     11/02/2018 1500   10/18/2018 0915  Novel Coronavirus,NAA,(SEND-OUT TO REF LAB - TAT 24-48 hrs); Hosp Order  (Asymptomatic Patients Labs)  Once,   STAT    Question:  Rule Out  Answer:  Yes   10/27/2018 0914   11/03/2018 0727  Urine rapid drug screen (hosp performed)  ONCE - STAT,   STAT     10/13/2018 0728   10/14/2018 0727  Urinalysis, Routine w reflex  microscopic  ONCE - STAT,   STAT     10/15/2018 0728          Vitals/Pain Today's Vitals   11/04/2018 1630 10/15/2018 1645 11/06/2018 1715  1730  BP: (!) 166/80 (!) 157/93 (!) 155/81 (!) 157/84  Pulse: (!) 145 (!) 143 (!) 137 (!) 141  Resp: (!) 32 (!) 39 (!) 33 (!) 24  Temp:      TempSrc:      SpO2: 95% 93% 94% 94%  Weight:      Height:        Isolation Precautions No active isolations  Medications Medications  nicardipine (CARDENE) 20mg  in 0.86% saline 265ml IV infusion (0.1 mg/ml) (0 mg/hr Intravenous Stopped 11/09/2018 1010)  0.9 %  sodium chloride infusion ( Intravenous Rate/Dose Change 10/18/2018 1011)  0.9 % NaCl with KCl 20 mEq/ L  infusion ( Intravenous [place] Bag/Given 11/01/2018 1703)  morphine 2 MG/ML injection 2 mg (2 mg Intravenous Given 10/27/2018 1835)  morphine 4 MG/ML injection 4 mg (has no administration in time range)  ondansetron (ZOFRAN-ODT) disintegrating tablet 4 mg (has no administration in time range)    Or  ondansetron (ZOFRAN) injection 4 mg (has no administration in time range)  pantoprazole (PROTONIX) EC tablet 40 mg (40 mg Oral Not Given 10/29/2018 1732)    Or  pantoprazole (PROTONIX) injection 40 mg ( Intravenous See Alternative  1732)  metoprolol tartrate (LOPRESSOR) injection 5 mg (has no administration in time range)  insulin aspart (novoLOG) injection 0-15 Units (has no administration in time range)  haloperidol lactate (HALDOL) injection 5 mg (has no administration in time range)  levothyroxine (SYNTHROID, LEVOTHROID) injection 12.5 mcg (has no administration in time range)  LORazepam (ATIVAN) 2 MG/ML injection (2 mg  Given 10/23/2018 0738)  iohexol (OMNIPAQUE) 350 MG/ML injection 100 mL (100 mLs Intravenous Contrast Given 11/02/2018 0803)  metoprolol tartrate (LOPRESSOR) injection 5 mg (5 mg Intravenous Given 10/23/2018  1101)  hydrALAZINE (APRESOLINE) injection 10 mg (10 mg Intravenous Given 10/25/2018 1336)    Mobility non-ambulatory High fall risk    Focused Assessments Neuro Assessment Handoff:  Swallow screen pass? No  Cardiac Rhythm: Sinus tachycardia NIH Stroke Scale ( + Modified Stroke Scale Criteria)  Interval: Other (Comment) Level of Consciousness (1a.)   : Alert, keenly responsive LOC Questions (1b. )   +: Answers neither question correctly LOC Commands (1c. )   + : Performs neither task correctly Best Gaze (2. )  +: Partial gaze palsy Visual (3. )  +: Complete hemianopia Facial Palsy (4. )    : Minor paralysis Motor Arm, Left (5a. )   +: Drift Motor Arm, Right (5b. )   +: Drift Motor Leg, Left (6a. )   +: Some effort against gravity Motor Leg, Right (6b. )   +: Some effort against gravity Limb Ataxia (7. ): Absent Sensory (8. )   +: Normal, no sensory loss Best Language (9. )   +: Mute, global aphasia Dysarthria (10. ): Severe dysarthria, patient's speech is so slurred as to be unintelligible in the absence of or out of proportion to any dysphasia, or is mute/anarthric Extinction/Inattention (11.)   +: Visual/tactile/auditory/spatial/personal inattention Modified SS Total  +: 17 Complete NIHSS TOTAL: 20 Last date known well: (unclear) Last time known well: (unclear) Neuro Assessment: Exceptions to WDL Neuro Checks:   Initial (11/09/2018 0730)  Last Documented NIHSS Modified Score: 17 (10/14/2018 1530) Has TPA been given? No If patient is a Neuro Trauma and patient is going to OR before floor call report to [facility] nurse: [phone] or [phone]     R Recommendations: See Admitting Provider Note  Report given to:   Additional Notes:

## 2018-10-24 NOTE — Consult Note (Signed)
Neurology Consultation Reason for Consult: Confusion Referring Physician: Sabra Heck, B  CC: Confusion  History is obtained from:patient's wife  HPI: Brian Barry is a 86 y.o. male with a hx of htn, DM, hpl who was in his normal sate of health this week with the exception of some mild confusion when he fell this morning. The wife states that immediately after the fall, he was normal, talking, and had no issues. Later, when at the table he stopped speaking to her and would not "do any of the stroke stuff" for his wife. She therefore called 911.    LKW: 5:30 am  tpa given?: no, ICH   ROS: Unable to obtain due to altered mental status.   Past Medical History:  Diagnosis Date  . Arthritis   . BPH (benign prostatic hyperplasia)   . DM (diabetes mellitus) ([place])   . Hyperlipidemia   . Hypertension   . Hypothyroidism   . Osteoporosis   . Pelvic fracture ([place])   . TIA (transient ischemic attack) 1998, 2014     Family History  Problem Relation Age of Onset  . CVA Father 64  . Prostate cancer Neg Hx   . Kidney cancer Neg Hx   . Bladder Cancer Neg Hx      Social History:  reports that he has never smoked. He has never used smokeless tobacco. He reports that he does not drink alcohol or use drugs.   Exam: Current vital signs: Wt 73.6 kg   BMI 24.67 kg/m  Vital signs in last 24 hours: Weight:  [73.6 kg] 73.6 kg (06/12 0700)   Physical Exam  Constitutional: Appears well-developed and well-nourished.  Psych: Affect appropriate to situation Eyes: No scleral injection HENT: No OP obstrucion Head: Normocephalic.  Cardiovascular: Normal rate and regular rhythm.  Respiratory: Effort normal, non-labored breathing GI: Soft.  No distension. There is no tenderness.  Skin: WDI  Neuro: Mental Status: Patient is awake, agitated, densely globally aphasic Cranial Nerves: II: Does not blink to threat from the right. Pupils are equal, round, and reactive to light.   III,IV, VI: EOMI  without ptosis or diploplia.  V: Facial sensation is symmetric to temperature VII: Facial movement is symmetric.  VIII: hearing is intact to voice X: Uvula elevates symmetrically XI: Shoulder shrug is symmetric. XII: tongue is midline without atrophy or fasciculations.  Motor: Tone is normal. Bulk is normal. 5/5 strength was present bilaterally Sensory: Responds to stim x 4.  Cerebellar: As above.    I have reviewed labs in epic and the results pertinent to this consultation are: cmp - mild hyponatremia.   I have reviewed the images obtained:CT head - left parietal SDH  Impression: 85 yo M with aphasia secondary to SDH and fall.   Recommendations: 1) Intially recommended CT/CTA, finalized at the time of finalizing this note.  2) Consider discussion with neurosurgery.    Roland Rack, MD [facility] [phone]  If 7pm- 7am, please page neurology on call as listed in [place].

## 2018-10-24 NOTE — ED Triage Notes (Signed)
BIB EMS from home. Pt fell this am around 530. Pt Became less verbal and combative after fall. Pt on thinners.

## 2018-10-25 LAB — BASIC METABOLIC PANEL
Anion gap: 14 (ref 5–15)
BUN: 15 mg/dL (ref 8–23)
CO2: 17 mmol/L — ABNORMAL LOW (ref 22–32)
Calcium: 8.2 mg/dL — ABNORMAL LOW (ref 8.9–10.3)
Chloride: 99 mmol/L (ref 98–111)
Creatinine, Ser: 0.99 mg/dL (ref 0.61–1.24)
GFR calc Af Amer: >60 mL/min (ref >60)
GFR calc non Af Amer: >60 mL/min (ref >60)
Glucose, Bld: 301 mg/dL — ABNORMAL HIGH (ref 70–99)
Potassium: 3.3 mmol/L — ABNORMAL LOW (ref 3.5–5.1)
Sodium: 130 mmol/L — ABNORMAL LOW (ref 135–145)

## 2018-10-25 LAB — CBC
HCT: 30.2 % — ABNORMAL LOW (ref 39.0–52.0)
Hemoglobin: 10.5 g/dL — ABNORMAL LOW (ref 13.0–17.0)
MCH: 32 pg (ref 26.0–34.0)
MCHC: 34.8 g/dL (ref 30.0–36.0)
MCV: 92.1 fL (ref 80.0–100.0)
Platelets: 165 10*3/uL (ref 150–400)
RBC: 3.28 MIL/uL — ABNORMAL LOW (ref 4.22–5.81)
RDW: 12.4 % (ref 11.5–15.5)
WBC: 11.1 10*3/uL — ABNORMAL HIGH (ref 4.0–10.5)
nRBC: 0 % (ref 0.0–0.2)

## 2018-10-25 LAB — RAPID URINE DRUG SCREEN, HOSP PERFORMED
Amphetamines: NOT DETECTED
Barbiturates: NOT DETECTED
Benzodiazepines: POSITIVE — AB
Cocaine: NOT DETECTED
Opiates: NOT DETECTED
Tetrahydrocannabinol: NOT DETECTED

## 2018-10-25 LAB — URINALYSIS, ROUTINE W REFLEX MICROSCOPIC
Bacteria, UA: NONE SEEN
Bilirubin Urine: NEGATIVE
Glucose, UA: >=500 mg/dL — AB
Ketones, ur: 80 mg/dL — AB
Leukocytes,Ua: NEGATIVE
Nitrite: NEGATIVE
Protein, ur: 30 mg/dL — AB
Specific Gravity, Urine: 1.021 (ref 1.005–1.030)
pH: 5 (ref 5.0–8.0)

## 2018-10-25 LAB — NOVEL CORONAVIRUS, NAA (HOSP ORDER, SEND-OUT TO REF LAB; TAT 18-24 HRS): SARS-CoV-2, NAA: NOT DETECTED

## 2018-10-25 LAB — GLUCOSE, CAPILLARY
Glucose-Capillary: 285 mg/dL — ABNORMAL HIGH (ref 70–99)
Glucose-Capillary: 135 mg/dL — ABNORMAL HIGH (ref 70–99)
Glucose-Capillary: 139 mg/dL — ABNORMAL HIGH (ref 70–99)
Glucose-Capillary: 149 mg/dL — ABNORMAL HIGH (ref 70–99)
Glucose-Capillary: 137 mg/dL — ABNORMAL HIGH (ref 70–99)

## 2018-10-25 LAB — MRSA PCR SCREENING: MRSA by PCR: NEGATIVE

## 2018-10-25 MED ORDER — METOPROLOL TARTRATE 5 MG/5ML IV SOLN
5.0000 mg | Freq: Two times a day (BID) | INTRAVENOUS | Status: DC
  Administered 2018-10-25 (×2): 5 mg via INTRAVENOUS
  Filled 2018-10-25 (×2): qty 5

## 2018-10-25 MED ORDER — POTASSIUM CHLORIDE 10 MEQ/100ML IV SOLN
10.0000 meq | INTRAVENOUS | Status: AC
  Administered 2018-10-25 (×5): 10 meq via INTRAVENOUS
  Filled 2018-10-25 (×5): qty 100

## 2018-10-25 MED ORDER — SODIUM CHLORIDE 0.9 % IV SOLN
1.0000 g | INTRAVENOUS | Status: DC
  Administered 2018-10-25 – 2018-10-26 (×2): 1 g via INTRAVENOUS
  Filled 2018-10-25 (×2): qty 10

## 2018-10-25 NOTE — Progress Notes (Signed)
PT Cancellation Note  Patient Details Name: MORGON PAMER MRN: [id number] DOB: 10-Aug-1929   Cancelled Treatment:    Reason Eval/Treat Not Completed: Patient not medically ready Per RN, pt unresponsive and not appropriate for therapy today.  Ellamae Sia, PT, DPT Acute Rehabilitation Services Pager [phone] Office [phone]    Willy Eddy 10/25/2018, 9:49 AM

## 2018-10-25 NOTE — Progress Notes (Signed)
SLP Cancellation Note  Patient Details Name: Brian Barry MRN: [id number] DOB: 04/19/1930   Cancelled treatment:        Pt is not medically ready for Evaluation. Responsive only to pain. Will continue attempts to evaluate.    Charlynne Cousins Jo Booze, MA, CCC-SLP 10/25/2018 9:17 AM

## 2018-10-25 NOTE — Progress Notes (Signed)
Patient given Tylenol suppository for temp of 99.9 Ax.  Incontinent of moderate amt of stool.  Wife has questions re:  Changing goals of care to improvement of quality of life.  Suggest palliative care involvement.  She states she and her husband have "an understanding" and have living wills, having no desire for prolongation of a life with no quality.

## 2018-10-25 NOTE — Progress Notes (Signed)
[facility] Progress Note     Subjective: CC: TBI Patient's wife at bedside and patient's daughter, Judson Roch, on speaker phone while I was seeing patient this AM.  Patient sleeping this AM, does not appear uncomfortable, but is not responsive. Wife reports he has not opened his eyes or verbalized at all for her. 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 GCS = 7 ([id number]). Discussed with family members that we will see how he does working with therapies and a lot will depend on whether or not he is able to swallow. Per family patient would not want a PEG or to be fed from any type of NGT/Cortrak.   Objective: Vital signs in last 24 hours: Temp:  [99.4 F (37.4 C)-103.7 F (39.8 C)] 99.4 F (37.4 C) (06/13 0359) Pulse Rate:  [98-151] 105 (06/13 0359) Resp:  [18-49] 36 (06/13 0359) BP: (119-188)/(72-122) 138/84 (06/13 0359) SpO2:  [88 %-99 %] 99 % (06/13 0359) Last BM Date: 10/17/2018  Intake/Output from previous day: 06/12 0701 - 06/13 0700 In: 60.2 [I.V.:60.2] Out: -  Intake/Output this shift: No intake/output data recorded.  PE: Gen:  Sleeping, unresponsive ENT: no raccoon eyes or battle sign Card:  Regular rate and rhythm, pedal pulses 2+ BL Pulm:  Normal effort, clear to auscultation bilaterally Abd: Soft, non-tender, non-distended, +BS Skin: warm and dry, no rashes; ecchymosis of bilateral forearms Neuro: pupils equal, does not follow commands, localized to pain with LUE   Lab Results:  Recent Labs    10/23/2018 0728 10/31/2018 0732 10/25/18 0459  WBC 6.3  --  11.1*  HGB 12.3* 12.9* 10.5*  HCT 36.8* 38.0* 30.2*  PLT 192  --  165   BMET Recent Labs    11/06/2018 0728 10/26/2018 0732 10/25/18 0459  NA 132* 131* 130*  K 4.1 4.1 3.3*  CL 94* 93* 99  CO2 25  --  17*  GLUCOSE 160* 156* 301*  BUN 11 12 15   CREATININE 0.92 0.70 0.99  CALCIUM 9.4  --  8.2*   PT/INR Recent Labs    11/08/2018 0728  LABPROT 13.6  INR  1.1   CMP     Component Value Date/Time   NA 130 (L) 10/25/2018 0459   NA 136 09/20/2011 1509   K 3.3 (L) 10/25/2018 0459   K 5.0 09/20/2011 1509   CL 99 10/25/2018 0459   CL 99 09/20/2011 1509   CO2 17 (L) 10/25/2018 0459   CO2 31 09/20/2011 1509   GLUCOSE 301 (H) 10/25/2018 0459   GLUCOSE 193 (H) 09/20/2011 1509   BUN 15 10/25/2018 0459   BUN 15 09/20/2011 1509   CREATININE 0.99 10/25/2018 0459   CREATININE 0.77 09/20/2011 1509   CALCIUM 8.2 (L) 10/25/2018 0459   CALCIUM 9.1 09/20/2011 1509   PROT 6.8 10/16/2018 0728   ALBUMIN 4.3 11/03/2018 0728   AST 29 10/23/2018 0728   ALT 14 10/31/2018 0728   ALKPHOS 50 11/02/2018 0728   BILITOT 0.5 10/14/2018 0728   GFRNONAA >60 10/25/2018 0459   GFRNONAA >60 09/20/2011 1509   GFRAA >60 10/25/2018 0459   GFRAA >60 09/20/2011 1509   Lipase  No results found for: LIPASE     Studies/Results: Ct Angio Head W Or Wo Contrast  Result Date: 10/13/2018 CLINICAL DATA:  Fall. Leftward gaze, right facial droop, right hemianopia, and aphasia. Subdural hematoma on earlier head CT. EXAM: CT ANGIOGRAPHY HEAD AND NECK CT CERVICAL  SPINE WITHOUT CONTRAST TECHNIQUE: Multidetector CT imaging of the head and neck was performed using the standard protocol during bolus administration of intravenous contrast. Multiplanar CT image reconstructions and MIPs were obtained to evaluate the vascular anatomy. Carotid stenosis measurements (when applicable) are obtained utilizing NASCET criteria, using the distal internal carotid diameter as the denominator. CT images of the cervical spine including coronal and sagittal reformats were reconstructed from the CTA data set. CONTRAST:  122mL OMNIPAQUE IOHEXOL 350 MG/ML SOLN COMPARISON:  Noncontrast head CT 11/01/2018 at 0733 hours. Brain MRI 05/25/2018. Head and neck CTA 11/12/2017. FINDINGS: CT HEAD FINDINGS There is moderate motion artifact through the skull base. Brain: Chronic infarcts are again seen in the right  frontal lobe, bilateral basal ganglia, right thalamus, and left pons patchy bilateral cerebral white matter hypodensities are nonspecific but compatible with moderate chronic small vessel ischemic disease. There is moderate cerebral atrophy. A small subdural hematoma is again noted over the left cerebral convexity measuring up to 7 mm in thickness without midline shift or other significant mass effect. There is also a smaller subdural hematoma over the right frontotemporal convexity measuring up to 5 mm in thickness without mass effect which was obscured by motion artifact on the earlier study. Vascular: Calcified atherosclerosis at the skull base. Skull: No fracture identified within limitations of motion artifact. Mild right sided scalp swelling. Sinuses: Paranasal sinuses and mastoid air cells are clear. Orbits: Bilateral cataract extraction. Review of the MIP images confirms the above findings CTA NECK FINDINGS Aortic arch: Standard 3 vessel aortic arch with widely patent arch vessel origins. Mild arch atherosclerosis. Moderate calcified plaque in the proximal left subclavian artery not resulting in significant stenosis. Right carotid system: Patent with mild predominantly calcified plaque about the carotid bifurcation. No evidence of significant stenosis or dissection. Left carotid system: Patent with mild-to-moderate predominantly calcified plaque at the carotid bifurcation and in the carotid bulb. No evidence of significant stenosis or dissection. Vertebral arteries: Patent with the left being mildly dominant. Unchanged calcified plaque at the left vertebral artery origin resulting in mild stenosis. Skeleton: No acute osseous abnormality identified. Detailed cervical spine assessment as reported below. Other neck: No evidence of cervical lymphadenopathy or mass. Upper chest: Clear lung apices. Review of the MIP images confirms the above findings CTA HEAD FINDINGS Anterior circulation: The internal carotid  arteries are patent from skull base to carotid termini with atherosclerotic plaque resulting in mild bilateral paraclinoid stenosis, stable to mildly progressed. ACAs and MCAs are patent without evidence of significant A1 or M1 stenosis. Branch vessel assessment is mildly to moderately limited by motion artifact, however no proximal branch occlusion is identified. No aneurysm is seen. Posterior circulation: The intracranial vertebral arteries are patent to the basilar with mild atherosclerotic irregularity resulting in less than 50% narrowing on the right. The basilar artery is widely patent. Posterior communicating arteries are not identified and may be diminutive or absent. The PCAs are patent with bilateral proximal P2 stenoses, severe on the right and moderate on the left and which appear to have mildly progressed from the prior CTA although motion artifact mildly limits assessment. There is also a severe distal right P2 stenosis which is new. No aneurysm is identified. Venous sinuses: Poorly evaluated due to arterial contrast timing. Anatomic variants: None. Review of the MIP images confirms the above findings CT CERVICAL SPINE FINDINGS Advanced facet degeneration in the cervical spine with trace anterolisthesis of C3 on C4 and 3 mm anterolisthesis of C4 on C5, C5 on  C6, and C7 on T1. Multilevel disc degeneration, severe at C6-7. Moderate to severe multilevel neural foraminal stenosis. No evidence of significant osseous spinal canal stenosis. No acute fracture identified. No visible spinal canal hematoma or prevertebral fluid. IMPRESSION: HEAD CT: 1. Small bilateral subdural hematomas, left larger than right. No mass effect. 2. Chronic ischemic changes with multiple old infarcts as above. HEAD AND NECK CTA: 1. No emergent large vessel occlusion. 2. Progressive intracranial atherosclerosis including severe right and moderate left proximal P2 stenoses. 3. Widely patent cervical carotid arteries. 4. Unchanged mild  left vertebral artery origin stenosis. 5.  Aortic Atherosclerosis (ICD10-I70.0). CERVICAL SPINE CT: 1. No acute cervical spine fracture. 2. Advanced cervical disc and facet degeneration. Electronically Signed   By: Logan Bores M.D.   On: 10/27/2018 08:48   Ct Angio Neck W Or Wo Contrast  Result Date: 11/08/2018 CLINICAL DATA:  Fall. Leftward gaze, right facial droop, right hemianopia, and aphasia. Subdural hematoma on earlier head CT. EXAM: CT ANGIOGRAPHY HEAD AND NECK CT CERVICAL SPINE WITHOUT CONTRAST TECHNIQUE: Multidetector CT imaging of the head and neck was performed using the standard protocol during bolus administration of intravenous contrast. Multiplanar CT image reconstructions and MIPs were obtained to evaluate the vascular anatomy. Carotid stenosis measurements (when applicable) are obtained utilizing NASCET criteria, using the distal internal carotid diameter as the denominator. CT images of the cervical spine including coronal and sagittal reformats were reconstructed from the CTA data set. CONTRAST:  130mL OMNIPAQUE IOHEXOL 350 MG/ML SOLN COMPARISON:  Noncontrast head CT 10/29/2018 at 0733 hours. Brain MRI 05/25/2018. Head and neck CTA 11/12/2017. FINDINGS: CT HEAD FINDINGS There is moderate motion artifact through the skull base. Brain: Chronic infarcts are again seen in the right frontal lobe, bilateral basal ganglia, right thalamus, and left pons patchy bilateral cerebral white matter hypodensities are nonspecific but compatible with moderate chronic small vessel ischemic disease. There is moderate cerebral atrophy. A small subdural hematoma is again noted over the left cerebral convexity measuring up to 7 mm in thickness without midline shift or other significant mass effect. There is also a smaller subdural hematoma over the right frontotemporal convexity measuring up to 5 mm in thickness without mass effect which was obscured by motion artifact on the earlier study. Vascular: Calcified  atherosclerosis at the skull base. Skull: No fracture identified within limitations of motion artifact. Mild right sided scalp swelling. Sinuses: Paranasal sinuses and mastoid air cells are clear. Orbits: Bilateral cataract extraction. Review of the MIP images confirms the above findings CTA NECK FINDINGS Aortic arch: Standard 3 vessel aortic arch with widely patent arch vessel origins. Mild arch atherosclerosis. Moderate calcified plaque in the proximal left subclavian artery not resulting in significant stenosis. Right carotid system: Patent with mild predominantly calcified plaque about the carotid bifurcation. No evidence of significant stenosis or dissection. Left carotid system: Patent with mild-to-moderate predominantly calcified plaque at the carotid bifurcation and in the carotid bulb. No evidence of significant stenosis or dissection. Vertebral arteries: Patent with the left being mildly dominant. Unchanged calcified plaque at the left vertebral artery origin resulting in mild stenosis. Skeleton: No acute osseous abnormality identified. Detailed cervical spine assessment as reported below. Other neck: No evidence of cervical lymphadenopathy or mass. Upper chest: Clear lung apices. Review of the MIP images confirms the above findings CTA HEAD FINDINGS Anterior circulation: The internal carotid arteries are patent from skull base to carotid termini with atherosclerotic plaque resulting in mild bilateral paraclinoid stenosis, stable to mildly progressed. ACAs and  MCAs are patent without evidence of significant A1 or M1 stenosis. Branch vessel assessment is mildly to moderately limited by motion artifact, however no proximal branch occlusion is identified. No aneurysm is seen. Posterior circulation: The intracranial vertebral arteries are patent to the basilar with mild atherosclerotic irregularity resulting in less than 50% narrowing on the right. The basilar artery is widely patent. Posterior communicating  arteries are not identified and may be diminutive or absent. The PCAs are patent with bilateral proximal P2 stenoses, severe on the right and moderate on the left and which appear to have mildly progressed from the prior CTA although motion artifact mildly limits assessment. There is also a severe distal right P2 stenosis which is new. No aneurysm is identified. Venous sinuses: Poorly evaluated due to arterial contrast timing. Anatomic variants: None. Review of the MIP images confirms the above findings CT CERVICAL SPINE FINDINGS Advanced facet degeneration in the cervical spine with trace anterolisthesis of C3 on C4 and 3 mm anterolisthesis of C4 on C5, C5 on C6, and C7 on T1. Multilevel disc degeneration, severe at C6-7. Moderate to severe multilevel neural foraminal stenosis. No evidence of significant osseous spinal canal stenosis. No acute fracture identified. No visible spinal canal hematoma or prevertebral fluid. IMPRESSION: HEAD CT: 1. Small bilateral subdural hematomas, left larger than right. No mass effect. 2. Chronic ischemic changes with multiple old infarcts as above. HEAD AND NECK CTA: 1. No emergent large vessel occlusion. 2. Progressive intracranial atherosclerosis including severe right and moderate left proximal P2 stenoses. 3. Widely patent cervical carotid arteries. 4. Unchanged mild left vertebral artery origin stenosis. 5.  Aortic Atherosclerosis (ICD10-I70.0). CERVICAL SPINE CT: 1. No acute cervical spine fracture. 2. Advanced cervical disc and facet degeneration. Electronically Signed   By: Logan Bores M.D.   On: 10/27/2018 08:48   Ct C-spine No Charge  Result Date: 10/20/2018 CLINICAL DATA:  Fall. Leftward gaze, right facial droop, right hemianopia, and aphasia. Subdural hematoma on earlier head CT. EXAM: CT ANGIOGRAPHY HEAD AND NECK CT CERVICAL SPINE WITHOUT CONTRAST TECHNIQUE: Multidetector CT imaging of the head and neck was performed using the standard protocol during bolus  administration of intravenous contrast. Multiplanar CT image reconstructions and MIPs were obtained to evaluate the vascular anatomy. Carotid stenosis measurements (when applicable) are obtained utilizing NASCET criteria, using the distal internal carotid diameter as the denominator. CT images of the cervical spine including coronal and sagittal reformats were reconstructed from the CTA data set. CONTRAST:  111mL OMNIPAQUE IOHEXOL 350 MG/ML SOLN COMPARISON:  Noncontrast head CT 10/27/2018 at 0733 hours. Brain MRI 05/25/2018. Head and neck CTA 11/12/2017. FINDINGS: CT HEAD FINDINGS There is moderate motion artifact through the skull base. Brain: Chronic infarcts are again seen in the right frontal lobe, bilateral basal ganglia, right thalamus, and left pons patchy bilateral cerebral white matter hypodensities are nonspecific but compatible with moderate chronic small vessel ischemic disease. There is moderate cerebral atrophy. A small subdural hematoma is again noted over the left cerebral convexity measuring up to 7 mm in thickness without midline shift or other significant mass effect. There is also a smaller subdural hematoma over the right frontotemporal convexity measuring up to 5 mm in thickness without mass effect which was obscured by motion artifact on the earlier study. Vascular: Calcified atherosclerosis at the skull base. Skull: No fracture identified within limitations of motion artifact. Mild right sided scalp swelling. Sinuses: Paranasal sinuses and mastoid air cells are clear. Orbits: Bilateral cataract extraction. Review of the MIP images  confirms the above findings CTA NECK FINDINGS Aortic arch: Standard 3 vessel aortic arch with widely patent arch vessel origins. Mild arch atherosclerosis. Moderate calcified plaque in the proximal left subclavian artery not resulting in significant stenosis. Right carotid system: Patent with mild predominantly calcified plaque about the carotid bifurcation. No  evidence of significant stenosis or dissection. Left carotid system: Patent with mild-to-moderate predominantly calcified plaque at the carotid bifurcation and in the carotid bulb. No evidence of significant stenosis or dissection. Vertebral arteries: Patent with the left being mildly dominant. Unchanged calcified plaque at the left vertebral artery origin resulting in mild stenosis. Skeleton: No acute osseous abnormality identified. Detailed cervical spine assessment as reported below. Other neck: No evidence of cervical lymphadenopathy or mass. Upper chest: Clear lung apices. Review of the MIP images confirms the above findings CTA HEAD FINDINGS Anterior circulation: The internal carotid arteries are patent from skull base to carotid termini with atherosclerotic plaque resulting in mild bilateral paraclinoid stenosis, stable to mildly progressed. ACAs and MCAs are patent without evidence of significant A1 or M1 stenosis. Branch vessel assessment is mildly to moderately limited by motion artifact, however no proximal branch occlusion is identified. No aneurysm is seen. Posterior circulation: The intracranial vertebral arteries are patent to the basilar with mild atherosclerotic irregularity resulting in less than 50% narrowing on the right. The basilar artery is widely patent. Posterior communicating arteries are not identified and may be diminutive or absent. The PCAs are patent with bilateral proximal P2 stenoses, severe on the right and moderate on the left and which appear to have mildly progressed from the prior CTA although motion artifact mildly limits assessment. There is also a severe distal right P2 stenosis which is new. No aneurysm is identified. Venous sinuses: Poorly evaluated due to arterial contrast timing. Anatomic variants: None. Review of the MIP images confirms the above findings CT CERVICAL SPINE FINDINGS Advanced facet degeneration in the cervical spine with trace anterolisthesis of C3 on C4  and 3 mm anterolisthesis of C4 on C5, C5 on C6, and C7 on T1. Multilevel disc degeneration, severe at C6-7. Moderate to severe multilevel neural foraminal stenosis. No evidence of significant osseous spinal canal stenosis. No acute fracture identified. No visible spinal canal hematoma or prevertebral fluid. IMPRESSION: HEAD CT: 1. Small bilateral subdural hematomas, left larger than right. No mass effect. 2. Chronic ischemic changes with multiple old infarcts as above. HEAD AND NECK CTA: 1. No emergent large vessel occlusion. 2. Progressive intracranial atherosclerosis including severe right and moderate left proximal P2 stenoses. 3. Widely patent cervical carotid arteries. 4. Unchanged mild left vertebral artery origin stenosis. 5.  Aortic Atherosclerosis (ICD10-I70.0). CERVICAL SPINE CT: 1. No acute cervical spine fracture. 2. Advanced cervical disc and facet degeneration. Electronically Signed   By: Logan Bores M.D.   On: 10/15/2018 08:48   Ct Head Code Stroke Wo Contrast  Result Date: 11/05/2018 CLINICAL DATA:  Code stroke.  Altered mental status. EXAM: CT HEAD WITHOUT CONTRAST TECHNIQUE: Contiguous axial images were obtained from the base of the skull through the vertex without intravenous contrast. COMPARISON:  MRI brain 05/25/2018 FINDINGS: Brain: The study is severely degraded by patient motion. Remote infarcts involving the right greater than left basal ganglia and bilateral corona radiata are again noted. Remote right frontal lobe infarct is noted. Left subdural hematoma is suspected. There is no significant midline shift. No definite posterior fossa lesions are present. The ventricles are proportionate to the degree of atrophy. Ventricle size is stable. Vascular: Motion  obscures evaluation of the vessels. Skull: Motion decreases sensitivity for skull lesions. No definite fractures are present. Sinuses/Orbits: No significant sinus disease is evident. ASPECTS [facility] Stroke Program Early CT Score) -  Ganglionic level infarction (caudate, lentiform nuclei, internal capsule, insula, M1-M3 cortex): A 7/7 - Supraganglionic infarction (M4-M6 cortex): 3/3 Total score (0-10 with 10 being normal): 10/10 IMPRESSION: 1. Small left subdural hematoma without significant midline shift. 2. Multiple remote infarcts are stable. 3. The study is severely degraded by patient motion. 4. No acute infarcts are evident. 5. ASPECTS is 10/10 These results were called by telephone at the time of interpretation on 11/05/2018 at 7:53 am to Dr. Leonel Ramsay, who verbally acknowledged these results. Electronically Signed   By: San Morelle M.D.   On: 10/22/2018 07:54    Anti-infectives: Anti-infectives (From admission, onward)   None       Assessment/Plan Hx of TIA HTN - schedule IV lopressor HLD T2DM - SSI BPH Hypothyroidism - IV levothyroxine Mild dementia  Fall Bilateral SDH - NS consulted, recommending following clinically - TBI therapies  DNR - per discussion with patient's POA, no intubation, no CPR, no feeding tube Hypokalemia - K 3.3 this AM, will replace IV  FEN: NPO, IVF - will need SLP eval VTE: SCDs, hold home plavix and ASA ID: Tmax 103, WBC 11 - will start on rocephin for suspected UTI, follow up UA results  Dispo: Check UA. TBI therapies.   LOS: 1 day    Brigid Re , [facility] 10/25/2018, 8:33 AM Pager: [phone]

## 2018-10-25 NOTE — Progress Notes (Signed)
Pt. Has home cpap. RN is aware that pt. Will wear depending on his covid test results.

## 2018-10-26 DIAGNOSIS — Z7189 Other specified counseling: Secondary | ICD-10-CM

## 2018-10-26 DIAGNOSIS — Z515 Encounter for palliative care: Secondary | ICD-10-CM

## 2018-10-26 DIAGNOSIS — W19XXXA Unspecified fall, initial encounter: Secondary | ICD-10-CM

## 2018-10-26 DIAGNOSIS — R569 Unspecified convulsions: Secondary | ICD-10-CM

## 2018-10-26 LAB — BASIC METABOLIC PANEL
Anion gap: 12 (ref 5–15)
BUN: 18 mg/dL (ref 8–23)
CO2: 18 mmol/L — ABNORMAL LOW (ref 22–32)
Calcium: 7.8 mg/dL — ABNORMAL LOW (ref 8.9–10.3)
Chloride: 105 mmol/L (ref 98–111)
Creatinine, Ser: 0.88 mg/dL (ref 0.61–1.24)
GFR calc Af Amer: >60 mL/min (ref >60)
GFR calc non Af Amer: >60 mL/min (ref >60)
Glucose, Bld: 122 mg/dL — ABNORMAL HIGH (ref 70–99)
Potassium: 3.7 mmol/L (ref 3.5–5.1)
Sodium: 135 mmol/L (ref 135–145)

## 2018-10-26 LAB — GLUCOSE, CAPILLARY
Glucose-Capillary: 75 mg/dL (ref 70–99)
Glucose-Capillary: 120 mg/dL — ABNORMAL HIGH (ref 70–99)
Glucose-Capillary: 151 mg/dL — ABNORMAL HIGH (ref 70–99)
Glucose-Capillary: 171 mg/dL — ABNORMAL HIGH (ref 70–99)

## 2018-10-26 LAB — CBC
HCT: 30.6 % — ABNORMAL LOW (ref 39.0–52.0)
Hemoglobin: 10.4 g/dL — ABNORMAL LOW (ref 13.0–17.0)
MCH: 32.5 pg (ref 26.0–34.0)
MCHC: 34 g/dL (ref 30.0–36.0)
MCV: 95.6 fL (ref 80.0–100.0)
Platelets: 141 10*3/uL — ABNORMAL LOW (ref 150–400)
RBC: 3.2 MIL/uL — ABNORMAL LOW (ref 4.22–5.81)
RDW: 12.6 % (ref 11.5–15.5)
WBC: 15 10*3/uL — ABNORMAL HIGH (ref 4.0–10.5)
nRBC: 0 % (ref 0.0–0.2)

## 2018-10-26 MED ORDER — MORPHINE SULFATE (PF) 2 MG/ML IV SOLN
2.0000 mg | INTRAVENOUS | Status: DC | PRN
  Administered 2018-10-26: 14:00:00 2 mg via INTRAVENOUS
  Administered 2018-10-26: 4 mg via INTRAVENOUS
  Administered 2018-10-26: 14:00:00 2 mg via INTRAVENOUS
  Administered 2018-10-26 – 2018-10-27 (×4): 4 mg via INTRAVENOUS
  Filled 2018-10-26: qty 2
  Filled 2018-10-26: qty 1
  Filled 2018-10-26 (×3): qty 2
  Filled 2018-10-26: qty 1
  Filled 2018-10-26 (×2): qty 2

## 2018-10-26 MED ORDER — ORAL CARE MOUTH RINSE
15.0000 mL | Freq: Two times a day (BID) | OROMUCOSAL | Status: DC
  Administered 2018-10-26 – 2018-10-30 (×8): 15 mL via OROMUCOSAL

## 2018-10-26 MED ORDER — LORAZEPAM 2 MG/ML IJ SOLN: 2.0000 mg | INTRAMUSCULAR | Status: DC | PRN

## 2018-10-26 MED ORDER — LEVETIRACETAM IN NACL 1500 MG/100ML IV SOLN
1500.0000 mg | Freq: Once | INTRAVENOUS | Status: AC
  Administered 2018-10-26: 1500 mg via INTRAVENOUS
  Filled 2018-10-26: qty 100

## 2018-10-26 MED ORDER — LORAZEPAM 2 MG/ML IJ SOLN
1.0000 mg | INTRAMUSCULAR | Status: DC | PRN
  Administered 2018-10-26 – 2018-10-27 (×2): 2 mg via INTRAVENOUS
  Filled 2018-10-26 (×2): qty 1

## 2018-10-26 MED ORDER — GLYCOPYRROLATE 0.2 MG/ML IJ SOLN
0.2000 mg | INTRAMUSCULAR | Status: DC | PRN
  Administered 2018-10-26 – 2018-10-27 (×2): 0.2 mg via INTRAVENOUS
  Filled 2018-10-26 (×2): qty 1

## 2018-10-26 MED ORDER — LEVETIRACETAM IN NACL 1000 MG/100ML IV SOLN
1000.0000 mg | Freq: Two times a day (BID) | INTRAVENOUS | Status: DC
  Administered 2018-10-27 – 2018-10-30 (×7): 1000 mg via INTRAVENOUS
  Filled 2018-10-26 (×11): qty 100

## 2018-10-26 MED ORDER — LORAZEPAM 2 MG/ML IJ SOLN
2.0000 mg | INTRAMUSCULAR | Status: DC | PRN
  Administered 2018-10-26: 09:00:00 2 mg via INTRAVENOUS
  Filled 2018-10-26: qty 1

## 2018-10-26 MED ORDER — MORPHINE SULFATE (PF) 2 MG/ML IV SOLN: 2.0000 mg | INTRAVENOUS | Status: DC | PRN

## 2018-10-26 NOTE — Progress Notes (Signed)
SLP Cancellation Note  Patient Details Name: Brian Barry MRN: [id number] DOB: 1929-09-19   Cancelled treatment:       Reason Eval/Treat Not Completed: Fatigue/lethargy limiting ability to participate  Patient with no change since yesterday.  ST will follow up in attempt to complete evaluations.   Shelly Flatten, MA, [facility] SLP [phone]  [name] 10/26/2018, 7:32 AM

## 2018-10-26 NOTE — Evaluation (Signed)
Physical Therapy Evaluation Patient Details Name: Brian Barry MRN: [id number] DOB: 09-25-1929 Today's Date: 10/26/2018   History of Present Illness  85 year old male who was brought to [facility] after a fall in the shower resulting in small bilateral SDH, L>R without mass effect or midline shift.  After fall patient started to develop deficits with speaking and command following. He presented to ED as code stroke. Patient with history of CVA, HTN, HLD, T2DM, hypothyroidism, BPH and dementia.  Clinical Impression  Orders received for TBI assessment. Wife present at baseline. Spoke with wife regarding goals for patient, wife desires patient transition to inpatient hospice services. Upon assessment, patient requires total assist for all aspects of mobility to EOB. Patient with increased LE tone and demonstrates no ability to attend to postioning needs due to lethargy and not following commands. At this time, feel PT services may be futile as patient unable to engage. Feel palliative care consult is appropriate and agree that given current condition, feel wife would have difficult time maintain patient's comfort KD:[id number] and self care needs. Given current condition, recommend air overlay mattress for patient comfort and frequent positioning with additional soft pillow supports over at risk areas. No further acute PT needs. Will sign off.    Follow Up Recommendations Supervision/Assistance - 24 hour(Paliative consult)    Equipment Recommendations  Hospital bed(air overlay, medical transport)    Recommendations for Other Services Other (comment)(paliative)     Precautions / Restrictions Precautions Precautions: Fall Precaution Comments: seizure Restrictions Weight Bearing Restrictions: No      Mobility  Bed Mobility Overal bed mobility: Needs Assistance Bed Mobility: Rolling;Supine to Sit;Sit to Supine Rolling: Total assist;+2 for physical assistance   Supine to sit: Total assist;+2  for physical assistance Sit to supine: Total assist;+2 for physical assistance   General bed mobility comments: Total assist for all aspects of mobility and positoning  Transfers                 General transfer comment: NT  Ambulation/Gait             General Gait Details: NT  Stairs            Wheelchair Mobility    Modified Rankin (Stroke Patients Only)       Balance Overall balance assessment: Needs assistance   Sitting balance-Leahy Scale: Zero Sitting balance - Comments: no evidence of postural stability, required max to total assist at EOB                                     Pertinent Vitals/Pain Pain Assessment: Faces Pain Score: 0-No pain    Home Living Family/patient expects to be discharged to:: Private residence Living Arrangements: Spouse/significant other Available Help at Discharge: Family;Available 24 hours/day Type of Home: Independent living facility Home Access: Level entry     Home Layout: One level Home Equipment: Walker - 4 wheels;Walker - 2 wheels;Cane - single point;Shower seat Additional Comments: Lives at [place] with spouse    Prior Function Level of Independence: Independent with assistive device(s);Needs assistance   Gait / Transfers Assistance Needed: Ambulates with rollator with supervision of spouse for safety (has recently been walking to bathroom at night alone)  ADL's / Homemaking Assistance Needed: required some assist from wife for dressing        Hand Dominance        Extremity/Trunk Assessment  Upper Extremity Assessment Upper Extremity Assessment: (active movement noted at rest, but minimal)    Lower Extremity Assessment Lower Extremity Assessment: (minimal active movement noted at rest, increased tone BLE)       Communication      Cognition Arousal/Alertness: Lethargic   Overall Cognitive Status: History of cognitive impairments - at baseline                                  General Comments: Patient withdraws to pain, eyes open to Upright at EOB positioning, but no physical engagement or ability to follow commands noted.      General Comments      Exercises     Assessment/Plan    PT Assessment Patent does not need any further PT services  PT Problem List         PT Treatment Interventions      PT Goals (Current goals can be found in the Care Plan section)  Acute Rehab PT Goals Patient Stated Goal: per wife: peaceful quick transition PT Goal Formulation: With family    Frequency     Barriers to discharge        Co-evaluation               AM-PAC PT "6 Clicks" Mobility  Outcome Measure Help needed turning from your back to your side while in a flat bed without using bedrails?: Total Help needed moving from lying on your back to sitting on the side of a flat bed without using bedrails?: Total Help needed moving to and from a bed to a chair (including a wheelchair)?: Total Help needed standing up from a chair using your arms (e.g., wheelchair or bedside chair)?: Total Help needed to walk in hospital room?: Total Help needed climbing 3-5 steps with a railing? : Total 6 Click Score: 6    End of Session   Activity Tolerance: Patient limited by fatigue;Patient limited by lethargy Patient left: in bed;with call bell/phone within reach;with bed alarm set;with family/visitor present Nurse Communication: (will need air mattress) PT Visit Diagnosis: Other symptoms and signs involving the nervous system (R29.898)    Time: 8527-7824 PT Time Calculation (min) (ACUTE ONLY): 18 min   Charges:   PT Evaluation $PT Eval Low Complexity: [place], PT DPT  Board Certified Neurologic Specialist [place] Pager [phone] Office [place] 10/26/2018, 11:33 AM

## 2018-10-26 NOTE — Progress Notes (Addendum)
1:45PM: Hospice cannot take him today but requested further information. CSW will follow up.  CSW received consult for hospice referral for [facility] in [place]. CSW will reach out to start referral.  Lamonte Richer, LCSW, [place] Worker II [phone]

## 2018-10-26 NOTE — Progress Notes (Signed)
[facility] Progress Note     Subjective: CC: seizure Wife reports 2 seizures this AM. Patient has not had seizures in the past that she can recall. Still no verbal response and has not opened eyes.   Objective: Vital signs in last 24 hours: Temp:  [98.2 F (36.8 C)-102 F (38.9 C)] 100 F (37.8 C) (06/14 0353) Pulse Rate:  [66-84] 66 (06/13 2051) Resp:  [19-35] 24 (06/13 2051) BP: (109-139)/(57-94) 136/77 (06/14 0353) SpO2:  [94 %-97 %] 94 % (06/13 1855) Last BM Date: 10/25/18  Intake/Output from previous day: 06/13 0701 - 06/14 0700 In: 2649.6 [I.V.:2081.9; IV Piggyback:567.7] Out: 1200 [Urine:1200] Intake/Output this shift: No intake/output data recorded.  PE: Gen:  Sleeping, unresponsive ENT: no raccoon eyes or battle sign Card:  Regular rate and rhythm, pedal pulses 2+ BL Pulm:  Normal effort, clear to auscultation bilaterally Abd: Soft, non-tender, non-distended, +BS Skin: warm and dry, no rashes; ecchymosis of bilateral forearms Neuro: pupils equal, does not follow commands, no verbal response, no longer localizing to pain  Lab Results:  Recent Labs    10/25/18 0459 10/26/18 0613  WBC 11.1* 15.0*  HGB 10.5* 10.4*  HCT 30.2* 30.6*  PLT 165 141*   BMET Recent Labs    10/25/18 0459 10/26/18 0613  NA 130* 135  K 3.3* 3.7  CL 99 105  CO2 17* 18*  GLUCOSE 301* 122*  BUN 15 18  CREATININE 0.99 0.88  CALCIUM 8.2* 7.8*   PT/INR Recent Labs    10/27/2018 0728  LABPROT 13.6  INR 1.1   CMP     Component Value Date/Time   NA 135 10/26/2018 0613   NA 136 09/20/2011 1509   K 3.7 10/26/2018 0613   K 5.0 09/20/2011 1509   CL 105 10/26/2018 0613   CL 99 09/20/2011 1509   CO2 18 (L) 10/26/2018 0613   CO2 31 09/20/2011 1509   GLUCOSE 122 (H) 10/26/2018 0613   GLUCOSE 193 (H) 09/20/2011 1509   BUN 18 10/26/2018 0613   BUN 15 09/20/2011 1509   CREATININE 0.88 10/26/2018 0613   CREATININE 0.77 09/20/2011 1509   CALCIUM 7.8 (L) 10/26/2018  0613   CALCIUM 9.1 09/20/2011 1509   PROT 6.8  0728   ALBUMIN 4.3 11/01/2018 0728   AST 29 10/16/2018 0728   ALT 14 10/17/2018 0728   ALKPHOS 50 11/07/2018 0728   BILITOT 0.5 10/29/2018 0728   GFRNONAA >60 10/26/2018 0613   GFRNONAA >60 09/20/2011 1509   GFRAA >60 10/26/2018 0613   GFRAA >60 09/20/2011 1509   Lipase  No results found for: LIPASE     Studies/Results: No results found.  Anti-infectives: Anti-infectives (From admission, onward)   Start     Dose/Rate Route Frequency Ordered Stop   10/25/18 0930  cefTRIAXone (ROCEPHIN) 1 g in sodium chloride 0.9 % 100 mL IVPB     1 g 200 mL/hr over 30 Minutes Intravenous Every 24 hours 10/25/18 0919         Assessment/Plan Hx of TIA HTN - schedule IV lopressor HLD T2DM - SSI BPH Hypothyroidism - IV levothyroxine Mild dementia  Fall Bilateral SDH - NS consulted, recommending following clinically - TBI therapies have not been able to work with patient as he is unresponsive DNR- per discussion with patient's POA, no intubation, no CPR, no feeding tube Hypokalemia - K 3.7, improved Seizures - loading dose Keppra followed by 1000 mg IV BID  FEN: NPO, IVF - will need SLP eval  VTE: SCDs, hold home plavix and ASA ID: Tmax 103, WBC 15 - started on rocephin yesterday, UA not suggestive of UTI  Dispo: Palliative consult. Keppra for seizures.   LOS: 2 days    Brigid Re , [facility] 10/26/2018, 8:27 AM Pager: [phone]

## 2018-10-26 NOTE — Consult Note (Signed)
Consultation Note Date: 10/26/2018   Patient Name: Brian Barry  DOB: 08-11-1929  MRN: [id number]  Age / Sex: 85 y.o., male  PCP: Derinda Late, MD Referring Physician: Md, Trauma, MD  Reason for Consultation: Establishing goals of care  HPI/Patient Profile: 85 y.o. male  with past medical history of dementia, TIA, hypothyroidism, HTN, HLD, DM, BPH, osteoporosis admitted on 11/07/2018 after fall at independent living facility. Found to have bilateral SDH, L>R. Neurosurgery consulted with conservative management, no surgical intervention recommended. If condition deteriorated, family would forgo surgical intervention. Patient remains unresponsive with inability to work with PT/OT/SLP. Seizure activity on 6/14 AM. Keppra started. Per family, patient is a DNR/DNI and would not want feeding tube placement. Palliative medicine consultation for goals of care.   Clinical Assessment and Goals of Care:  I have reviewed medical records, discussed with care team, and assessed the patient at bedside. Responds to pain, otherwise unresponsive. Does not appear to be in distress or discomfort. Per wife, seizure activity x2 this morning.  Met with wife Brian Barry) and daughter Brian Barry) at bedside to discuss diagnosis, prognosis, GOC, EOL wishes, disposition and options.  Introduced Palliative Medicine as specialized medical care for people living with serious illness. It focuses on providing relief from the symptoms and stress of a serious illness.   We discussed a brief life review of the patient. Worked in [employer] (medical). Married to [place] for 61 years. They have two daughters and one son. Prior to admission, Mr. And Mrs. Beauchaine living at [facility] independent living facility. He was diagnosed with dementia about one year ago, with worsening functional and cognitive status. Baseline, he requires assist with ADL's and has become more  forgetful.  Discussed events leading up to admission and course of hospitalization including diagnoses, interventions. Patient has been lethargic/unresponsive since admit. He is not able to work with speech or physical therapy. Wife was at bedside this morning and witnessed two seizures.  I attempted to elicit values and goals of care important to the patient and family. Advanced directives, concepts specific to code status, artifical feeding and hydration, and rehospitalization were considered and discussed. Wife and daughter confirm DNR/DNI and NO feeding tube. He and wife have previously discussed wishes against heroic interventions at EOL. Daughter shares that this last year has been challenging for him, requiring more assistance and with worsening quality of life.   The difference between aggressive medical intervention and comfort care was considered in light of the patient's goals of care. Introduced hospice options and philosophy. Family prefers hospice facility in [place].   Explained discontinuation of interventions not aimed at comfort including lab draws, cardiac monitor, and IVF. Wife and daughter understand and agree with plan. They do not want this prolonged and do not want him to suffer. Discussed role of symptom management medications to ensure comfort. Educated on EOL expectations. Discussed comfort feeds if he should awake and ask for something. Discussed good oral care.  Therapeutic listening and emotional support provided. Questions and concerns addressed. Hard Choices booklet left  for review. PMT contact information given.  Updated RN and secure epic chat sent to Brigid Re, [place] and Dr. Grandville Silos regarding plan of care and focus on comfort.    SUMMARY OF RECOMMENDATIONS    GOC discussion with wife and daughter at bedside. Family confirms patient's previous decision against heroic measures at EOL including DNR/DNI and no feeding tube. Family consensus for shift to comfort  measures only in order to allow a natural death.   Discontinued interventions not aimed at comfort including cardiac monitor/IVF/labs.  Symptom management--see below.   Comfort feeds per patient/family request. Continue good oral care.   TOC RN/SW consult for residential hospice placement. Family requesting hospice facility in [place].  PMT will continue to support inpatient.   Code Status/Advance Care Planning:  DNR  Symptom Management:   Morphine 2-57m IV q1h prn pain/dyspnea/tachypnea/air hunger  Ativan 1-263mIV q3h prn anxiety/seizure activity  Haldol 1m81mV q6h prn agitation  Robinul 0.2mg38m q4h prn secretions  Continue scheduled Keppra for seizure management  Palliative Prophylaxis:   Aspiration, Delirium Protocol, Frequent Pain Assessment, Oral Care and Turn Reposition  Additional Recommendations (Limitations, Scope, Preferences):  Full Comfort Care  Psycho-social/Spiritual:   Desire for further Chaplaincy support: yes  Additional Recommendations: Caregiving  Support/Resources, Compassionate Wean Education and Education on Hospice  Prognosis:   < 2 weeks s/p fall, bilateral SDH, seizures, and unresponsiveness. No oral intake with previously diagnosed progressive dementia.   Discharge Planning: Hospice facility      Primary Diagnoses: Present on Admission: . SDH (subdural hematoma) (HCC)   I have reviewed the medical record, interviewed the patient and family, and examined the patient. The following aspects are pertinent.  Past Medical History:  Diagnosis Date  . Arthritis   . BPH (benign prostatic hyperplasia)   . DM (diabetes mellitus) (HCC)[place]. Hyperlipidemia   . Hypertension   . Hypothyroidism   . Osteoporosis   . Pelvic fracture (HCC)[place]. TIA (transient ischemic attack) 1998, 2014   Social History   Socioeconomic History  . Marital status: Married    Spouse name: Not on file  . Number of children: Not on file  . Years of  education: Not on file  . Highest education level: Not on file  Occupational History  . Not on file  Social Needs  . Financial resource strain: Not on file  . Food insecurity    Worry: Not on file    Inability: Not on file  . Transportation needs    Medical: Not on file    Non-medical: Not on file  Tobacco Use  . Smoking status: Never Smoker  . Smokeless tobacco: Never Used  Substance and Sexual Activity  . Alcohol use: No    Alcohol/week: 0.0 standard drinks  . Drug use: No  . Sexual activity: Never  Lifestyle  . Physical activity    Days per week: Not on file    Minutes per session: Not on file  . Stress: Not on file  Relationships  . Social connHerbalistphone: Not on file    Gets together: Not on file    Attends religious service: Not on file    Active member of club or organization: Not on file    Attends meetings of clubs or organizations: Not on file    Relationship status: Not on file  Other Topics Concern  . Not on file  Social History Narrative  . Not on file  Family History  Problem Relation Age of Onset  . CVA Father 83  . Prostate cancer Neg Hx   . Kidney cancer Neg Hx   . Bladder Cancer Neg Hx    Scheduled Meds: . mouth rinse  15 mL Mouth Rinse BID   Continuous Infusions: . levETIRAcetam     PRN Meds:.acetaminophen, glycopyrrolate, haloperidol lactate, LORazepam, morphine injection, ondansetron **OR** ondansetron (ZOFRAN) IV Medications Prior to Admission:  Prior to Admission medications   Medication Sig Start Date End Date Taking? Authorizing Provider  aspirin 81 MG chewable tablet Chew 1 tablet (81 mg total) by mouth daily. 05/28/18  Yes Epifanio Lesches, MD  Calcium Carbonate-Vitamin D 600-400 MG-UNIT tablet Take 1 tablet by mouth 2 (two) times daily.   Yes [provider]  clopidogrel (PLAVIX) 75 MG tablet Take 75 mg by mouth daily. 07/11/15  Yes [provider]  finasteride (PROSCAR) 5 MG tablet Take 5 mg by  mouth daily.   Yes [provider]  gabapentin (NEURONTIN) 300 MG capsule Take 300 mg by mouth at bedtime.   Yes [provider]  galantamine (RAZADYNE ER) 8 MG 24 hr capsule Take 8 mg by mouth daily with breakfast.   Yes [provider]  LANTUS SOLOSTAR 100 UNIT/ML Solostar Pen Inject 20 Units into the skin daily.    Yes [provider]  levothyroxine (SYNTHROID, LEVOTHROID) 25 MCG tablet Take 25 mcg by mouth at bedtime.    Yes [provider]  lisinopril (PRINIVIL,ZESTRIL) 10 MG tablet Take 10 mg by mouth daily.  07/11/15  Yes [provider]  metFORMIN (GLUCOPHAGE) 500 MG tablet Take 1,000 mg by mouth 2 (two) times daily.  08/12/15  Yes [provider]  polyethylene glycol (MIRALAX / GLYCOLAX) packet Take 17 g by mouth daily. Patient taking differently: Take 17 g by mouth daily as needed for moderate constipation.  09/12/15  Yes Wieting, Richard, MD  PROLIA 60 MG/ML SOSY injection Inject 60 mg into the skin every 6 (six) months. 09/13/17  Yes [provider]  senna-docusate (SENOKOT-S) 8.6-50 MG tablet Take 1 tablet by mouth at bedtime as needed for mild constipation. 09/12/15  Yes Wieting, Richard, MD  simvastatin (ZOCOR) 20 MG tablet Take 20 mg by mouth every evening.  07/11/15  Yes [provider]  tamsulosin (FLOMAX) 0.4 MG CAPS capsule Take 0.4 mg by mouth 2 (two) times daily. 08/08/15  Yes [provider]  vitamin B-12 (CYANOCOBALAMIN) 1000 MCG tablet Take 1,000 mcg daily by mouth.   Yes [provider]  insulin aspart (NOVOLOG) 100 UNIT/ML FlexPen Inject 5 Units into the skin 3 (three) times daily with meals. Patient not taking: Reported on 07/12/2016 05/19/16   Theodoro Grist, MD   No Known Allergies Review of Systems  Unable to perform ROS: Acuity of condition   Physical Exam Vitals signs and nursing note reviewed.  Constitutional:      Appearance: He is ill-appearing.  HENT:     Head:  Normocephalic and atraumatic.  Cardiovascular:     Rate and Rhythm: Normal rate.  Pulmonary:     Effort: No tachypnea, accessory muscle usage or respiratory distress.  Skin:    General: Skin is warm and dry.  Neurological:     Mental Status: He is lethargic.     Comments: PERRLA    Vital Signs: BP (!) 99/47 (BP Location: Left Arm)   Pulse (!) 53   Temp 98.2 F (36.8 C)   Resp (!) 22  Ht _0  (1.854 m)   Wt 73.6 kg   SpO2 96%   BMI 21.41 kg/m  Pain Scale: CPOT       SpO2: SpO2: 96 % O2 Device:SpO2: 96 % O2 Flow Rate: .O2 Flow Rate (L/min): 2 L/min  IO: Intake/output summary:   Intake/Output Summary (Last 24 hours) at 10/26/2018 1705 Last data filed at 10/26/2018 1600 Gross per 24 hour  Intake 1784.06 ml  Output 1200 ml  Net 584.06 ml    LBM: Last BM Date: 10/25/18 Baseline Weight: Weight: 73.6 kg Most recent weight: Weight: 73.6 kg     Palliative Assessment/Data: PPS 20%   Flowsheet Rows     Most Recent Value  Intake Tab  Referral Department  Trauma  Unit at Time of Referral  Intermediate Care Unit  Palliative Care Primary Diagnosis  Neurology  Date Notified  10/26/18  Palliative Care Type  New Palliative care  Reason for referral  Clarify Goals of Care  Date first seen by Palliative Care  10/26/18  # of days Palliative referral response time  0 Day(s)  Clinical Assessment  Palliative Performance Scale Score  20%  Psychosocial & Spiritual Assessment  Palliative Care Outcomes  Patient/Family meeting held?  Yes  Who was at the meeting?  wife and daughter  Palliative Care Outcomes  Clarified goals of care, Counseled regarding hospice, Provided end of life care assistance, Provided psychosocial or spiritual support, Changed to focus on comfort, Transitioned to hospice, ACP counseling assistance      Time In: 1200 Time Out: 1320 Time Total: 80 Greater than 50%  of this time was spent counseling and coordinating care related to the above assessment and  plan.  Signed by:  Ihor Dow, FNP-C Palliative Medicine Team  Phone: [phone] Fax: [phone]   Please contact Palliative Medicine Team phone at [phone] for questions and concerns.  For individual provider: See Shea Evans

## 2018-10-27 DIAGNOSIS — W19XXXD Unspecified fall, subsequent encounter: Secondary | ICD-10-CM

## 2018-10-27 DIAGNOSIS — R0600 Dyspnea, unspecified: Secondary | ICD-10-CM

## 2018-10-27 LAB — URINE CULTURE: Culture: 50000 — AB

## 2018-10-27 MED ORDER — SODIUM CHLORIDE 0.9 % IV SOLN
1.0000 mg/h | INTRAVENOUS | Status: DC
  Administered 2018-10-27: 17:00:00 0.5 mg/h via INTRAVENOUS
  Administered 2018-10-29: 1 mg/h via INTRAVENOUS
  Filled 2018-10-27 (×2): qty 5

## 2018-10-27 MED ORDER — LORAZEPAM 2 MG/ML IJ SOLN
1.5000 mg/h | INTRAVENOUS | Status: DC
  Administered 2018-10-27: 17:00:00 0.5 mg/h via INTRAVENOUS
  Administered 2018-10-29: 09:00:00 1 mg/h via INTRAVENOUS
  Filled 2018-10-27 (×3): qty 25

## 2018-10-27 MED ORDER — LORAZEPAM BOLUS VIA INFUSION
1.0000 mg | INTRAVENOUS | Status: DC | PRN
  Administered 2018-10-28 (×2): 1 mg via INTRAVENOUS
  Filled 2018-10-27: qty 2

## 2018-10-27 MED ORDER — MORPHINE BOLUS VIA INFUSION
2.0000 mg | INTRAVENOUS | Status: DC | PRN
  Administered 2018-10-27: 14:00:00 2 mg via INTRAVENOUS
  Administered 2018-10-27: 13:00:00 4 mg via INTRAVENOUS
  Filled 2018-10-27: qty 4

## 2018-10-27 MED ORDER — LORAZEPAM 2 MG/ML IJ SOLN
1.0000 mg | Freq: Four times a day (QID) | INTRAMUSCULAR | Status: DC
  Administered 2018-10-27: 12:00:00 1 mg via INTRAVENOUS
  Filled 2018-10-27: qty 1

## 2018-10-27 MED ORDER — HYDROMORPHONE BOLUS VIA INFUSION
0.5000 mg | INTRAVENOUS | Status: DC | PRN
  Administered 2018-10-28: 13:00:00 1 mg via INTRAVENOUS
  Administered 2018-10-28: 12:00:00 0.5 mg via INTRAVENOUS
  Administered 2018-10-28: 13:00:00 1 mg via INTRAVENOUS
  Filled 2018-10-27: qty 1

## 2018-10-27 MED ORDER — MORPHINE 100MG IN NS 100ML (1MG/ML) PREMIX INFUSION
1.0000 mg/h | INTRAVENOUS | Status: DC
  Administered 2018-10-27: 12:00:00 1 mg/h via INTRAVENOUS
  Filled 2018-10-27: qty 100

## 2018-10-27 NOTE — Progress Notes (Signed)
80 mls of Morphine 1 mg/mL wasted with Daria Pastures.

## 2018-10-27 NOTE — Social Work (Addendum)
10:20am- Spoke with Jinny Blossom, Palliative NP. Aware pt now on iv morphine and ativan per PMT. I will follow for stability and appropriateness for transfer tomorrow prior to proceeding with any transfer plans.   10:00am- No beds available at [facility]. Marcene Brawn, liaison, will complete paperwork with family and ensure everything in order for admission tomorrow pending medical stability.   8:45am- CSW followed up with Marcene Brawn, admissions liaison, at [facility]. Await information regarding bed availability.   CSW continuing to follow for support with disposition.  Westley Hummer, MSW, [place] Work [phone]

## 2018-10-27 NOTE — Progress Notes (Signed)
[facility]/Trauma Progress Note      Assessment/Plan Hx of TIA HTN- scheduleIVlopressor HLD T2DM - SSI BPH Hypothyroidism - IV levothyroxine Mild dementia  Fall Bilateral SDH - NS recommending following clinically - TBI therapies have not been able to work with patient as he is unresponsive DNR- per discussion with patient's POA, no intubation, no CPR, no feeding tube Hypokalemia- resolved  Seizures - Keppra  Dispo:family has decided on comfort care. Transfer to 6N. Social work Chiropodist.    LOS: 3 days    Subjective: CC: TBI  Family states pt has not opened his eyes for them. They did not have any questions for me. I discussed transfer to 6N.   Objective: Vital signs in last 24 hours: Temp:  [98.2 F (36.8 C)] 98.2 F (36.8 C) (06/14 1151) Pulse Rate:  [53] 53 (06/14 1151) Resp:  [22] 22 (06/14 1151) BP: (99)/(47) 99/47 (06/14 1151) SpO2:  [96 %] 96 % (06/14 1151) Last BM Date: 10/26/18  Intake/Output from previous day: 06/14 0701 - 06/15 0700 In: 255 [I.V.:255] Out: 500 [Urine:500] Intake/Output this shift: No intake/output data recorded.  PE: Gen: sleeping, nonresponsive Card:  RRR, no M/G/R heard Pulm:  CTA, no W/R/R, effort normal Skin: no rashes noted, warm and dry Neuro: nonresponsive   Anti-infectives: Anti-infectives (From admission, onward)   Start     Dose/Rate Route Frequency Ordered Stop   10/25/18 0930  cefTRIAXone (ROCEPHIN) 1 g in sodium chloride 0.9 % 100 mL IVPB  Status:  Discontinued     1 g 200 mL/hr over 30 Minutes Intravenous Every 24 hours 10/25/18 0919 10/26/18 1253      Lab Results:  Recent Labs    10/25/18 0459 10/26/18 0613  WBC 11.1* 15.0*  HGB 10.5* 10.4*  HCT 30.2* 30.6*  PLT 165 141*   BMET Recent Labs    10/25/18 0459 10/26/18 0613  NA 130* 135  K 3.3* 3.7  CL 99 105  CO2 17* 18*  GLUCOSE 301* 122*  BUN 15 18  CREATININE 0.99 0.88  CALCIUM 8.2*  7.8*   PT/INR No results for input(s): LABPROT, INR in the last 72 hours. CMP     Component Value Date/Time   NA 135 10/26/2018 0613   NA 136 09/20/2011 1509   K 3.7 10/26/2018 0613   K 5.0 09/20/2011 1509   CL 105 10/26/2018 0613   CL 99 09/20/2011 1509   CO2 18 (L) 10/26/2018 0613   CO2 31 09/20/2011 1509   GLUCOSE 122 (H) 10/26/2018 0613   GLUCOSE 193 (H) 09/20/2011 1509   BUN 18 10/26/2018 0613   BUN 15 09/20/2011 1509   CREATININE 0.88 10/26/2018 0613   CREATININE 0.77 09/20/2011 1509   CALCIUM 7.8 (L) 10/26/2018 0613   CALCIUM 9.1 09/20/2011 1509   PROT 6.8 10/17/2018 0728   ALBUMIN 4.3  0728   AST 29 10/31/2018 0728   ALT 14 10/13/2018 0728   ALKPHOS 50 10/28/2018 0728   BILITOT 0.5 10/17/2018 0728   GFRNONAA >60 10/26/2018 0613   GFRNONAA >60 09/20/2011 1509   GFRAA >60 10/26/2018 0613   GFRAA >60 09/20/2011 1509   Lipase  No results found for: LIPASE  Studies/Results: No results found.    Kalman Drape , [facility] 10/27/2018, 9:06 AM  Pager: [phone] Mon-Wed, Friday 7:00am-4:30pm Thurs 7am-11:30am  Consults: [phone]

## 2018-10-27 NOTE — Progress Notes (Signed)
Daily Progress Note   Patient Name: Brian Barry       Date: 10/27/2018 DOB: [birth year]  Age: 85 y.o. MRN#: [id number] Attending Physician: Particia Jasper, MD Primary Care Physician: Derinda Late, MD Admit Date: 10/29/2018  Reason for Consultation/Follow-up: Establishing goals of care and Terminal Care  Subjective: Patient unresponsive. He has received Morphine 24mg  IV push in less than 24 hours. While at bedside, patient with intermittent lip twitching/? Seizure like activity and periods of apnea/increased work of breathing. Diaphoretic.  IV leaking. RN to page IV team.  GOC:   Wife and son at bedside. Daughters, Clarise Cruz and [place] on speaker phone. Discussed plan of care and clinical status including medical recommendation to initiate continuous morphine infusion with significant morphine requirement over night. Wife witnessed two seizures yesterday afternoon after PMT visit. We also discussed scheduling ativan. Wife and children agreeable with plan of care and initiation of morphine infusion. Discussed EOL expectations and goal for comfort and relief from suffering. Discussed holding on transfer to hospice facility with high symptom burden. Plan for transfer to [facility] for EOL care.   Answered questions and concerns for family. PMT contact information given.   Updated RN and Trauma PA on plan of care.  Length of Stay: 3  Current Medications: Scheduled Meds:  . LORazepam  1 mg Intravenous Q6H  . mouth rinse  15 mL Mouth Rinse BID    Continuous Infusions: . levETIRAcetam 1,000 mg (10/27/18 0839)  . morphine      PRN Meds: acetaminophen, glycopyrrolate, haloperidol lactate, LORazepam, morphine injection, morphine, ondansetron **OR** ondansetron (ZOFRAN) IV  Physical Exam Vitals signs  and nursing note reviewed.  Constitutional:      Appearance: He is ill-appearing and diaphoretic.     Comments: unresponsive  Cardiovascular:     Rate and Rhythm: Normal rate.  Pulmonary:     Breath sounds: Decreased breath sounds present.     Comments: Periods of apnea/air hunger Abdominal:     Tenderness: There is no abdominal tenderness.  Skin:    General: Skin is warm.     Findings: Ecchymosis present.  Neurological:     Mental Status: He is unresponsive.     Comments: Intermittent lip twitching            Vital Signs: BP (!) 99/47 (BP Location: Left  Arm)   Pulse (!) 53   Temp 98.2 F (36.8 C)   Resp (!) 22   Ht 6\' 1"  (1.854 m)   Wt 73.6 kg   SpO2 96%   BMI 21.41 kg/m  SpO2: SpO2: 96 % O2 Device: O2 Device: Nasal Cannula O2 Flow Rate: O2 Flow Rate (L/min): 2 L/min  Intake/output summary:   Intake/Output Summary (Last 24 hours) at 10/27/2018 1119 Last data filed at 10/27/2018 0524 Gross per 24 hour  Intake 0 ml  Output 500 ml  Net -500 ml   LBM: Last BM Date: 10/26/18 Baseline Weight: Weight: 73.6 kg Most recent weight: Weight: 73.6 kg       Palliative Assessment/Data: PPS 10%    Flowsheet Rows     Most Recent Value  Intake Tab  Referral Department  Trauma  Unit at Time of Referral  Intermediate Care Unit  Palliative Care Primary Diagnosis  Neurology  Date Notified  10/26/18  Palliative Care Type  New Palliative care  Reason for referral  Clarify Goals of Care  Date first seen by Palliative Care  10/26/18  # of days Palliative referral response time  0 Day(s)  Clinical Assessment  Palliative Performance Scale Score  10%  Psychosocial & Spiritual Assessment  Palliative Care Outcomes  Patient/Family meeting held?  Yes  Who was at the meeting?  wife and daughter  Palliative Care Outcomes  Clarified goals of care, Counseled regarding hospice, Provided end of life care assistance, Provided psychosocial or spiritual support, Changed to focus on comfort,  Transitioned to hospice, ACP counseling assistance      Patient Active Problem List   Diagnosis Date Noted  . Palliative care by specialist   . Goals of care, counseling/discussion   . Fall   . Seizures ([place])   . Subdural hematoma ([place]) 10/23/2018  . CVA (cerebral vascular accident) ([place]) 11/12/2017  . TIA (transient ischemic attack) 07/16/2016  . Confusion   . Ascending aortic aneurysm ([place]) 05/19/2016  . Hyponatremia 05/19/2016  . Generalized weakness 05/19/2016  . Sepsis due to pneumonia ([place]) 05/18/2016  . Hip fracture ([place]) 09/10/2015    Palliative Care Assessment & Plan   Patient Profile: 85 y.o. male  with past medical history of dementia, TIA, hypothyroidism, HTN, HLD, DM, BPH, osteoporosis admitted on 10/14/2018 after fall at independent living facility. Found to have bilateral SDH, L>R. Neurosurgery consulted with conservative management, no surgical intervention recommended. If condition deteriorated, family would forgo surgical intervention. Patient remains unresponsive with inability to work with PT/OT/SLP. Seizure activity on 6/14 AM. Keppra started. Per family, patient is a DNR/DNI and would not want feeding tube placement. Palliative medicine consultation for goals of care.   Assessment: Fall Bilateral SDH Seizure activity Hypokalemia Unresponsiveness Hx of dementia  Recommendations/Plan:  GOC with family 10/26/18. Family confirms patient's previous decision against heroic measures at EOL including DNR/DNI and NO feeding tube. Family consensus for shift to comfort measures only in order to allow a natural death.   Interventions not aimed at comfort have been discontinued.   Symptom management:  Patient has required significant morphine use in the last 24 hours (Required 24mg  morphine IV push). RN to initiate continuous Morphine infusion 1mg /hr.  RN may bolus morphine via infusion 2-4mg  IV q95min prn pain/dyspnea/air hunger  Ativan 1mg  q6h scheduled for  seizure activity. Continue scheduled Keppra.  Ativan 1-2mg  IV q3h prn anxiety/seizures  Haldol 5mg  IV q6h prn agitation  Robinol 0.2mg  IV q4h prn secretions  Hold on hospice home  referral. Patient unstable for transfer with high symptom burden. Updated SW and trauma PA.  PMT will follow inpatient.  Goals of Care and Additional Recommendations:  Limitations on Scope of Treatment: Full Comfort Care  Code Status: DNR/DNI   Code Status Orders  (From admission, onward)         Start     Ordered   11/05/2018 1501  Do not attempt resuscitation (DNR)  Continuous    Question Answer Comment  In the event of cardiac or respiratory ARREST Do not call a "code blue"   In the event of cardiac or respiratory ARREST Do not perform Intubation, CPR, defibrillation or ACLS   In the event of cardiac or respiratory ARREST Use medication by any route, position, wound care, and other measures to relive pain and suffering. May use oxygen, suction and manual treatment of airway obstruction as needed for comfort.      10/31/2018 1500        Code Status History    Date Active Date Inactive Code Status Order ID Comments User Context   05/25/2018 1440 05/28/2018 1524 DNR [id number]  Gladstone Lighter, MD Inpatient   11/12/2017 1007 11/13/2017 1734 DNR [id number]  Dustin Flock, MD Inpatient   07/17/2016 1144 07/17/2016 1641 DNR [id number]  Fritzi Mandes, MD Inpatient   07/16/2016 0900 07/17/2016 1144 Full Code [id number]  Saundra Shelling, MD Inpatient   05/18/2016 0357 05/19/2016 1438 Full Code [id number]  Harvie Bridge, DO Inpatient   09/10/2015 1604 09/12/2015 1946 Full Code [id number]  Nicholes Mango, MD Inpatient   Advance Care Planning Activity    Advance Directive Documentation     Most Recent Value  Type of Advance Directive  Out of facility DNR (pink MOST or yellow form)  Pre-existing out of facility DNR order (yellow form or pink MOST form)  Yellow form placed in chart (order not valid for inpatient use)  "MOST"  Form in Place?  -       Prognosis:   Days if not hours  Discharge Planning:  To Be Determined: Hospital death vs. Inpatient hospice facility if stable for transfer  Care plan was discussed with RN, Trauma PA, wife, son, daughters, SW  Thank you for allowing the Palliative Medicine Team to assist in the care of this patient.   Time In: 0950 Time Out: 1030 Total Time 40 Prolonged Time Billed  no       Greater than 50%  of this time was spent counseling and coordinating care related to the above assessment and plan.  Ihor Dow, DNP, FNP-C Palliative Medicine Team  Phone: [phone] Fax: [phone]  Please contact Palliative Medicine Team phone at [phone] for questions and concerns.

## 2018-10-27 NOTE — Progress Notes (Signed)
F/u PMT visit with patient and family. Family reports patient has had a least 7 seizures this afternoon, lasting ~30 seconds. Patient has received one scheduled dose of ativan 1mg  IV and one prn dose of ativan 2mg  IV. Morphine infusion started this AM.  Discussed plan of care with PMT physician, Dr Rowe Pavy. Plan to initiate Ativan 0.5mg /hr continuous infusion. RN may bolus ativan via infusion 1-2mg  q1h prn seizures/agitation. Change morphine gtt to Dilaudid 0.5mg /hr continuous infusion. RN may bolus dilaudid via infusion 0.5-1mg  q42min prn breakthrough pain/dyspnea/air hunger/tachypnea. Continue scheduled Keppra q12h. Updated RN and family on plan of care.   Unstable for transfer to hospice facility with high symptom burden. Recommend leaving inpatient for EOL care.   Recommend increasing ativan basal rate if seizures continue. If seizures continue despite ativan infusion, may need to consider fosphenytoin per pharmacy or palliative sedation with Propofol infusion.   Updated Trauma PA and Dr. Georgette Dover via epic chat.   NO CHARGE  Ihor Dow, [place], FNP-C Palliative Medicine Team  Phone: [phone] Fax: [phone]

## 2018-10-28 DIAGNOSIS — K117 Disturbances of salivary secretion: Secondary | ICD-10-CM

## 2018-10-28 MED ORDER — SCOPOLAMINE 1 MG/3DAYS TD PT72
1.0000 | MEDICATED_PATCH | TRANSDERMAL | Status: DC
  Administered 2018-10-28: 1.5 mg via TRANSDERMAL
  Filled 2018-10-28: qty 1

## 2018-10-28 MED ORDER — GLYCOPYRROLATE 0.2 MG/ML IJ SOLN
0.2000 mg | Freq: Four times a day (QID) | INTRAMUSCULAR | Status: DC
  Administered 2018-10-28 – 2018-10-30 (×9): 0.2 mg via INTRAVENOUS
  Filled 2018-10-28 (×8): qty 1

## 2018-10-28 MED ORDER — HYDROMORPHONE BOLUS VIA INFUSION
0.5000 mg | INTRAVENOUS | Status: DC | PRN
  Administered 2018-10-29: 0.5 mg via INTRAVENOUS
  Administered 2018-10-29 – 2018-10-30 (×2): 1 mg via INTRAVENOUS
  Filled 2018-10-28: qty 1

## 2018-10-28 NOTE — Progress Notes (Signed)
Patient is too unstable for transport.  Family request patient to stay at the hospital. Will reject referral.  Please call [facility] if you would like r-refer.  Dimas Aguas, RN Clinical Nurse Liaison [facility] [phone]

## 2018-10-28 NOTE — Progress Notes (Addendum)
Daily Progress Note   Patient Name: Brian Barry       Date: 10/28/2018 DOB: 08/05/1929  Age: 85 y.o. MRN#: [id number] Attending Physician: Particia Jasper, MD Primary Care Physician: Derinda Late, MD Admit Date: 10/23/2018  Reason for Consultation/Follow-up: Establishing goals of care and Terminal Care  Subjective:  Unresponsive to sternal rub. Actively dying but appears comfortable on dilaudid and ativan infusions. Respiratory pattern regular, non-labored. Audible secretions. No s/s of pain or discomfort.   Wife at bedside. She has not noticed any seizure like activity since ativan infusion was started. She reports he was comfortable through the night.  Answered questions and concerns about plan of care and symptom management. Discussed EOL expectations and plan to leave him in the hospital for EOL care. Wife has PMT contact information.   ADDENDUM 1601-0932: Received call from RN regarding symptoms. F/u with patient/family at bedside. Family reports seizure-like activity and patient appears mildly uncomfortable with increased work of breathing and accessory muscle usage. RR 22. Increased audible secretions. Adjustments made to medications and discussed with RN. Increased dilaudid infusion to 1mg /hr and increased ativan infusion to 1mg /hr. RN may bolus dilaudid via infusion 0.5-1mg  q39min prn pain/dyspnea/air hunger. RN may bolus ativan via infusion 1-2mg  q1h prn.  This NP administered Dilaudid 1mg  bolus 1235 and Dilaudid 1mg  bolus at 1250. Updated RN.  Stayed with patient and family to ensure comfort after medication adjustments. Assisted with repositioning and educated on EOL expectations. Chaplain visited at bedside this morning. Answered all questions and concerns for family at bedside  (wife, daughter, son).    Length of Stay: 4  Current Medications: Scheduled Meds:  . glycopyrrolate  0.2 mg Intravenous Q6H  . mouth rinse  15 mL Mouth Rinse BID    Continuous Infusions: . HYDROmorphone 0.5 mg/hr (10/27/18 1634)  . levETIRAcetam 1,000 mg (10/27/18 2126)  . LORazepam (ATIVAN) infusion 0.5 mg/hr (10/27/18 1638)    PRN Meds: acetaminophen, glycopyrrolate, haloperidol lactate, HYDROmorphone, LORazepam, ondansetron **OR** ondansetron (ZOFRAN) IV  Physical Exam Vitals signs and nursing note reviewed.  Constitutional:      Appearance: He is ill-appearing.     Comments: unresponsive  Cardiovascular:     Rate and Rhythm: Regular rhythm.  Pulmonary:     Effort: No tachypnea, accessory muscle usage or respiratory distress.     Breath  sounds: Decreased breath sounds and rhonchi present.     Comments: Regular, non-labored respirations Abdominal:     Tenderness: There is no abdominal tenderness.  Skin:    General: Skin is warm and dry.     Findings: Ecchymosis present.  Neurological:     Mental Status: He is unresponsive.            Vital Signs: BP (!) 102/51 (BP Location: Right Arm)   Pulse 88   Temp 100.1 F (37.8 C) (Oral)   Resp 17   Ht 6\' 1"  (1.854 m)   Wt 73.6 kg   SpO2 96%   BMI 21.41 kg/m  SpO2: SpO2: 96 % O2 Device: O2 Device: Nasal Cannula O2 Flow Rate: O2 Flow Rate (L/min): 1 L/min  Intake/output summary:   Intake/Output Summary (Last 24 hours) at 10/28/2018 0755 Last data filed at 10/28/2018 0500 Gross per 24 hour  Intake 124.18 ml  Output 150 ml  Net -25.82 ml   LBM: Last BM Date: 10/26/18 Baseline Weight: Weight: 73.6 kg Most recent weight: Weight: 73.6 kg       Palliative Assessment/Data: PPS 10%    Flowsheet Rows     Most Recent Value  Intake Tab  Referral Department  Trauma  Unit at Time of Referral  Intermediate Care Unit  Palliative Care Primary Diagnosis  Neurology  Date Notified  10/26/18  Palliative Care Type  New  Palliative care  Reason for referral  Clarify Goals of Care  Date first seen by Palliative Care  10/26/18  # of days Palliative referral response time  0 Day(s)  Clinical Assessment  Palliative Performance Scale Score  10%  Psychosocial & Spiritual Assessment  Palliative Care Outcomes  Patient/Family meeting held?  Yes  Who was at the meeting?  wife and daughter  Palliative Care Outcomes  Clarified goals of care, Counseled regarding hospice, Provided end of life care assistance, Provided psychosocial or spiritual support, Changed to focus on comfort, Transitioned to hospice, ACP counseling assistance      Patient Active Problem List   Diagnosis Date Noted  . Dyspnea   . Palliative care by specialist   . Terminal care   . Fall   . Seizures ([place])   . Subdural hematoma ([place]) 10/17/2018  . CVA (cerebral vascular accident) ([place]) 11/12/2017  . TIA (transient ischemic attack) 07/16/2016  . Confusion   . Ascending aortic aneurysm ([place]) 05/19/2016  . Hyponatremia 05/19/2016  . Generalized weakness 05/19/2016  . Sepsis due to pneumonia ([place]) 05/18/2016  . Hip fracture ([place]) 09/10/2015    Palliative Care Assessment & Plan   Patient Profile: 85 y.o. male  with past medical history of dementia, TIA, hypothyroidism, HTN, HLD, DM, BPH, osteoporosis admitted on 10/25/2018 after fall at independent living facility. Found to have bilateral SDH, L>R. Neurosurgery consulted with conservative management, no surgical intervention recommended. If condition deteriorated, family would forgo surgical intervention. Patient remains unresponsive with inability to work with PT/OT/SLP. Seizure activity on 6/14 AM. Keppra started. Per family, patient is a DNR/DNI and would not want feeding tube placement. Palliative medicine consultation for goals of care.   Assessment: Fall Bilateral SDH Seizure activity Hypokalemia Unresponsiveness Hx of dementia  Recommendations/Plan:  GOC with family 10/26/18.  Family confirms patient's previous decision against heroic measures at EOL including DNR/DNI and NO feeding tube. Family consensus for shift to comfort measures only in order to allow a natural death.   Interventions not aimed at comfort have been discontinued.   Symptom  management:  Continue Dilaudid 1mg /hr continuous infusion. RN may bolus via infusion prn pain/dyspnea.  Continue Ativan 1mg /hr continuous infusion. RN may bolus via infusion prn seizures/agitation  Robinul 0.2mg  IV q6h scheduled for secretions.  Scopolamine TD  Haldol 5mg  IV q6h prn agitation  Robinol 0.2mg  IV q4h prn secretions  Patient unstable for transfer with high symptom burden requiring dilaudid and ativan infusions. Recommend leaving inpatient for EOL care.   PMT will follow inpatient.  Goals of Care and Additional Recommendations:  Limitations on Scope of Treatment: Full Comfort Care  Code Status: DNR/DNI   Code Status Orders  (From admission, onward)         Start     Ordered   11/07/2018 1501  Do not attempt resuscitation (DNR)  Continuous    Question Answer Comment  In the event of cardiac or respiratory ARREST Do not call a "code blue"   In the event of cardiac or respiratory ARREST Do not perform Intubation, CPR, defibrillation or ACLS   In the event of cardiac or respiratory ARREST Use medication by any route, position, wound care, and other measures to relive pain and suffering. May use oxygen, suction and manual treatment of airway obstruction as needed for comfort.      10/20/2018 1500        Code Status History    Date Active Date Inactive Code Status Order ID Comments User Context   05/25/2018 1440 05/28/2018 1524 DNR [id number]  Gladstone Lighter, MD Inpatient   11/12/2017 1007 11/13/2017 1734 DNR [id number]  Dustin Flock, MD Inpatient   07/17/2016 1144 07/17/2016 1641 DNR [id number]  Fritzi Mandes, MD Inpatient   07/16/2016 0900 07/17/2016 1144 Full Code [id number]  Saundra Shelling, MD Inpatient    05/18/2016 0357 05/19/2016 1438 Full Code [id number]  HugelmeyerUbaldo Glassing, DO Inpatient   09/10/2015 1604 09/12/2015 1946 Full Code [id number]  Nicholes Mango, MD Inpatient   Advance Care Planning Activity    Advance Directive Documentation     Most Recent Value  Type of Advance Directive  Out of facility DNR (pink MOST or yellow form)  Pre-existing out of facility DNR order (yellow form or pink MOST form)  Yellow form placed in chart (order not valid for inpatient use)  "MOST" Form in Place?  -       Prognosis:   Hours-days  Discharge Planning:  Anticipated Hospital Death   Care plan was discussed with RN, Trauma PA, wife, son, daughter  Thank you for allowing the Palliative Medicine Team to assist in the care of this patient.   Time In: 0725- 1235- Time Out: [id number] Total Time 65 Prolonged Time Billed  no       Greater than 50%  of this time was spent counseling and coordinating care related to the above assessment and plan.  Ihor Dow, DNP, FNP-C Palliative Medicine Team  Phone: [phone] Fax: [phone]  Please contact Palliative Medicine Team phone at [phone] for questions and concerns.

## 2018-10-28 NOTE — Progress Notes (Signed)
   10/28/18 0900  Clinical Encounter Type  Visited With Patient and family together  Visit Type Initial;Spiritual support  Referral From Palliative care team  Consult/Referral To Chaplain  This chaplain responded to PMT consult for EOL spiritual care.  This chaplain introduced herself to the Pt. family. The chaplain recognized the family's ability to sit with and support each other. 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  The chaplain heard the Pt.son share his father's request for a peaceful transition to eternal life. The Pt. Family accepted the chaplain's invitation for prayer.  The chaplain is available for F/U spiritual care as the Pt. children arrive at the hospital.

## 2018-10-28 NOTE — Progress Notes (Signed)
Facial twitching noted, prn given. Md made aware ,awaiting orders.

## 2018-10-28 NOTE — Progress Notes (Signed)
  [facility] Progress Note     Subjective: CC-  Events of yesterday noted. Patient started on dilaudid and ativan infusions. Appears comfortable this AM. Wife at bedside.  Objective: Vital signs in last 24 hours: Temp:  [98.8 F (37.1 C)-100.1 F (37.8 C)] 100.1 F (37.8 C) (06/16 0449) Pulse Rate:  [79-88] 88 (06/16 0449) Resp:  [17-18] 17 (06/16 0449) BP: (102-147)/(51-77) 102/51 (06/16 0449) SpO2:  [93 %-96 %] 96 % (06/16 0449) Last BM Date: 10/26/18  Intake/Output from previous day: 06/15 0701 - 06/16 0700 In: 124.2 [I.V.:23.9; IV Piggyback:100.3] Out: 150 [Urine:150] Intake/Output this shift: No intake/output data recorded.  PE: Gen:  Sleeping, NAD Pulm:  Bilateral rhonchi, no wheezing, effort mostly normal some periods of apnea Abd: Soft, NT/ND Neuro: unresponsive Skin: warm and dry  Lab Results:  Recent Labs    10/26/18 0613  WBC 15.0*  HGB 10.4*  HCT 30.6*  PLT 141*   BMET Recent Labs    10/26/18 0613  NA 135  K 3.7  CL 105  CO2 18*  GLUCOSE 122*  BUN 18  CREATININE 0.88  CALCIUM 7.8*   PT/INR No results for input(s): LABPROT, INR in the last 72 hours. CMP     Component Value Date/Time   NA 135 10/26/2018 0613   NA 136 09/20/2011 1509   K 3.7 10/26/2018 0613   K 5.0 09/20/2011 1509   CL 105 10/26/2018 0613   CL 99 09/20/2011 1509   CO2 18 (L) 10/26/2018 0613   CO2 31 09/20/2011 1509   GLUCOSE 122 (H) 10/26/2018 0613   GLUCOSE 193 (H) 09/20/2011 1509   BUN 18 10/26/2018 0613   BUN 15 09/20/2011 1509   CREATININE 0.88 10/26/2018 0613   CREATININE 0.77 09/20/2011 1509   CALCIUM 7.8 (L) 10/26/2018 0613   CALCIUM 9.1 09/20/2011 1509   PROT 6.8 10/27/2018 0728   ALBUMIN 4.3 10/28/2018 0728   AST 29 11/06/2018 0728   ALT 14 10/17/2018 0728   ALKPHOS 50 11/01/2018 0728   BILITOT 0.5 10/29/2018 0728   GFRNONAA >60 10/26/2018 0613   GFRNONAA >60 09/20/2011 1509   GFRAA >60 10/26/2018 0613   GFRAA >60 09/20/2011 1509    Lipase  No results found for: LIPASE     Studies/Results: No results found.  Anti-infectives: Anti-infectives (From admission, onward)   Start     Dose/Rate Route Frequency Ordered Stop   10/25/18 0930  cefTRIAXone (ROCEPHIN) 1 g in sodium chloride 0.9 % 100 mL IVPB  Status:  Discontinued     1 g 200 mL/hr over 30 Minutes Intravenous Every 24 hours 10/25/18 0919 10/26/18 1253       Assessment/Plan Hx of TIA HTN HLD T2DM - SSI BPH Hypothyroidism  Mild dementia  Fall Bilateral SDH  DNR Hypokalemia  Seizures- Keppra  Dispo:Continue comfort care. Appreciate palliative assistance. No longer transferring to hospice, plan inpatient for EOL care.   LOS: 4 days    Wellington Hampshire , [facility] 10/28/2018, 8:09 AM Pager: [phone] Mon-Thurs 7:00 am-4:30 pm Fri 7:00 am -11:30 AM Sat-Sun 7:00 am-11:30 am

## 2018-10-28 NOTE — Progress Notes (Signed)
Nutrition Brief Note  Chart reviewed. Pt now transitioning to comfort care.  No further nutrition interventions warranted at this time.  Please re-consult as needed.   Anner Baity A. Zerenity Bowron, RD, LDN, CDCES Registered Dietitian II Certified Diabetes Care and Education Specialist Pager: [phone] After hours Pager: [phone]  

## 2018-10-28 NOTE — Progress Notes (Signed)
Bed has come open at the hospice home.  Bed offer made to [place].  She states patient has been started on a morphine gtt and the team wants to see how he does on it. Bed will be available tomorrow- and we will hold bed for Brian Barry.  Will touch base with Michiel Cowboy SW tomorrow morning.  Thank you for allowing participation in this patient's care.  Dimas Aguas [phone]

## 2018-10-28 NOTE — Progress Notes (Signed)
I spoke with Thomasene Lot regarding referral for inpatient hospice.  Patient is appropriate for inpatient hospice.  We are not able to offer a bed today, but will have one open tomorrow for transport.  I will touch base with Michiel Cowboy in the morning for follow-up.  Thank you for allowing participation in this patient's care.  Dimas Aguas, RN [facility] [phone]

## 2018-10-29 DIAGNOSIS — Z515 Encounter for palliative care: Secondary | ICD-10-CM

## 2018-10-29 DIAGNOSIS — S065X9A Traumatic subdural hemorrhage with loss of consciousness of unspecified duration, initial encounter: Principal | ICD-10-CM

## 2018-10-29 MED ORDER — KETOROLAC TROMETHAMINE 30 MG/ML IJ SOLN
30.0000 mg | Freq: Four times a day (QID) | INTRAMUSCULAR | Status: DC | PRN
  Administered 2018-10-29: 30 mg via INTRAVENOUS
  Filled 2018-10-29: qty 1

## 2018-10-29 NOTE — Progress Notes (Signed)
Patient ID: GIOVANNI BIBY, male   DOB: 12-27-1929, 85 y.o.   MRN: [id number]       Subjective: Patient sleeping.  Family at bedside.  No further seizure activity since yesterday.  Family states patient seems comfortable.  Objective: Vital signs in last 24 hours: Temp:  [102.3 F (39.1 C)] 102.3 F (39.1 C) (06/17 0600) Pulse Rate:  [107] 107 (06/17 0600) Resp:  [14] 14 (06/17 0600) BP: (91)/(44) 91/44 (06/17 0600) SpO2:  [81 %] 81 % (06/17 0600) Last BM Date: 10/26/18  Intake/Output from previous day: 06/16 0701 - 06/17 0700 In: 121.1 [I.V.:21.1; IV Piggyback:100] Out: 350 [Urine:350] Intake/Output this shift: No intake/output data recorded.  PE: Gen: NAD, sleeping Heart: regular, mildly tachy Lungs: clear anteriorly, no respiratory distress currently, most recent O2 sat 81% Abd: soft, ND, some BS Neuro: unresponsive  Lab Results:  No results for input(s): WBC, HGB, HCT, PLT in the last 72 hours. BMET No results for input(s): NA, K, CL, CO2, GLUCOSE, BUN, CREATININE, CALCIUM in the last 72 hours. PT/INR No results for input(s): LABPROT, INR in the last 72 hours. CMP     Component Value Date/Time   NA 135 10/26/2018 0613   NA 136 09/20/2011 1509   K 3.7 10/26/2018 0613   K 5.0 09/20/2011 1509   CL 105 10/26/2018 0613   CL 99 09/20/2011 1509   CO2 18 (L) 10/26/2018 0613   CO2 31 09/20/2011 1509   GLUCOSE 122 (H) 10/26/2018 0613   GLUCOSE 193 (H) 09/20/2011 1509   BUN 18 10/26/2018 0613   BUN 15 09/20/2011 1509   CREATININE 0.88 10/26/2018 0613   CREATININE 0.77 09/20/2011 1509   CALCIUM 7.8 (L) 10/26/2018 0613   CALCIUM 9.1 09/20/2011 1509   PROT 6.8 10/19/2018 0728   ALBUMIN 4.3 10/16/2018 0728   AST 29 11/01/2018 0728   ALT 14 11/01/2018 0728   ALKPHOS 50 11/08/2018 0728   BILITOT 0.5 10/29/2018 0728   GFRNONAA >60 10/26/2018 0613   GFRNONAA >60 09/20/2011 1509   GFRAA >60 10/26/2018 0613   GFRAA >60 09/20/2011 1509   Lipase  No results found for:  LIPASE     Studies/Results: No results found.  Anti-infectives: Anti-infectives (From admission, onward)   Start     Dose/Rate Route Frequency Ordered Stop   10/25/18 0930  cefTRIAXone (ROCEPHIN) 1 g in sodium chloride 0.9 % 100 mL IVPB  Status:  Discontinued     1 g 200 mL/hr over 30 Minutes Intravenous Every 24 hours 10/25/18 0919 10/26/18 1253       Assessment/Plan Hx of TIA HTN HLD T2DM - SSI BPH Hypothyroidism  Mild dementia  Fall Bilateral SDH  DNR Seizures- Keppra, ativan gtt  Dispo:Continue comfort care. Appreciate palliative assistance. No longer transferring to hospice, plan inpatient for EOL care.   LOS: 5 days    Henreitta Cea , [facility] 10/29/2018, 8:10 AM Pager: [phone]

## 2018-10-29 NOTE — Progress Notes (Signed)
   10/29/18 0100  Clinical Encounter Type  Visited With Health care provider  Visit Type Initial;Patient actively dying  Referral From Chaplain   Referral from palliative chaplain, b/c EOL.  Family in room, laying down to rest.  Spoke w/ care RN, pls pg if needed overnight.  Will report out to day shift if not called.  Myra Gianotti resident, [phone]

## 2018-10-29 NOTE — Care Management Important Message (Signed)
Important Message  Patient Details  Name: Brian Barry MRN: [id number] Date of Birth: Jun 18, 1929   Medicare Important Message Given:  Yes    Memory Argue 10/29/2018, 1:46 PM

## 2018-10-29 NOTE — Progress Notes (Signed)
Daily Progress Note   Patient Name: Brian Barry       Date: 10/29/2018 DOB: 04-13-1930  Age: 85 y.o. MRN#: [id number] Attending Physician: Particia Jasper, MD Primary Care Physician: Derinda Late, MD Admit Date: 10/27/2018  Reason for Consultation/Follow-up: Non pain symptom management, Psychosocial/spiritual support and Terminal Care  Subjective: Patient non-responsive and actively dying.  Family at bedside.  They state that over night he had seizures that presented with jaw tremoring.  The seizures are quite upsetting to the family.  We discussed time frame.  Unfortunately I'm unable to give them a precise time frame.  Brian Barry could pass away at any time or he may last another 24 to 48 hours.    Brian Barry is quite hot to the touch.  It appears he made approximately 50 mls of urine since the bag was emptied last night.  We discussed fever and oliguria and signs he is nearing the end of life.   Family states he is ready to go.   Assessment: Patient currently in NAD, but hot to touch with reported seizure again overnight.   Patient Profile/HPI:  85 y.o.malewith past medical history of dementia, TIA, hypothyroidism, HTN, HLD, DM, BPH, osteoporosisadmitted on 6/12/2020after fall at independent living facility. Found to have bilateral SDH, L>R. Neurosurgery consulted with conservative management, no surgical intervention recommended. If condition deteriorated, family would forgo surgical intervention. Patient remains unresponsive with inability to work with PT/OT/SLP. Seizure activity on 6/14 AM. Keppra started. Per family, patient is a DNR/DNI and would not want feeding tube placement. Palliative medicine consultation for goals of care.  As of 6/16 patient was made full comfort care.  He has had  seizures and is on continuous drips of ativan and dilaudid.  Length of Stay: 5  Current Medications: Scheduled Meds:   glycopyrrolate  0.2 mg Intravenous Q6H   mouth rinse  15 mL Mouth Rinse BID   scopolamine  1 patch Transdermal Q72H    Continuous Infusions:  HYDROmorphone 1 mg/hr (10/28/18 1244)   levETIRAcetam 1,000 mg (10/29/18 0831)   LORazepam (ATIVAN) infusion 1.5 mg/hr (10/29/18 0938)    PRN Meds: acetaminophen, glycopyrrolate, haloperidol lactate, HYDROmorphone, ketorolac, LORazepam, ondansetron **OR** ondansetron (ZOFRAN) IV  Physical Exam        Well developed elderly male, non-responsive, actively dying CV regular, slightly tachy Breath  sounds stunted with pauses no w/c/r Abdomen soft, thin LE without edema, hot to touch.  Vital Signs: BP (!) 91/44 (BP Location: Right Wrist)    Pulse (!) 107    Temp (!) 102.3 F (39.1 C) (Oral)    Resp 14    Ht 6\' 1"  (1.854 m)    Wt 73.6 kg    SpO2 (!) 81%    BMI 21.41 kg/m  SpO2: SpO2: (!) 81 % O2 Device: O2 Device: Room Air O2 Flow Rate: O2 Flow Rate (L/min): 1 L/min  Intake/output summary:   Intake/Output Summary (Last 24 hours) at 10/29/2018 1027 Last data filed at 10/29/2018 0018 Gross per 24 hour  Intake 121.09 ml  Output 225 ml  Net -103.91 ml   LBM: Last BM Date: 10/26/18 Baseline Weight: Weight: 73.6 kg Most recent weight: Weight: 73.6 kg       Palliative Assessment/Data:    Flowsheet Rows     Most Recent Value  Intake Tab  Referral Department  Trauma  Unit at Time of Referral  Intermediate Care Unit  Palliative Care Primary Diagnosis  Neurology  Date Notified  10/26/18  Palliative Care Type  New Palliative care  Reason for referral  Clarify Goals of Care  Date first seen by Palliative Care  10/26/18  # of days Palliative referral response time  0 Day(s)  Clinical Assessment  Palliative Performance Scale Score  10%  Psychosocial & Spiritual Assessment  Palliative Care Outcomes    Patient/Family meeting held?  Yes  Who was at the meeting?  wife and daughter  Palliative Care Outcomes  Clarified goals of care, Counseled regarding hospice, Provided end of life care assistance, Provided psychosocial or spiritual support, Changed to focus on comfort, Transitioned to hospice, ACP counseling assistance      Patient Active Problem List   Diagnosis Date Noted   Increased oropharyngeal secretions    Dyspnea    Palliative care by specialist    Terminal care    Fall    Seizures ([place])    Subdural hematoma ([place]) 10/17/2018   CVA (cerebral vascular accident) ([place]) 11/12/2017   TIA (transient ischemic attack) 07/16/2016   Confusion    Ascending aortic aneurysm ([place]) 05/19/2016   Hyponatremia 05/19/2016   Generalized weakness 05/19/2016   Sepsis due to pneumonia ([place]) 05/18/2016   Hip fracture ([place]) 09/10/2015    Palliative Care Plan    Recommendations/Plan:  Added toradol PRN fever.  (am concern fever will bring seizures)  Increased ativan gtt to 1.5 mg/hour due to seizures.  PMT will round again later in the day to ensure comfort.  Patient not stable for transport.  Actively dying.  Likely to seize.  Goals of Care and Additional Recommendations:  Limitations on Scope of Treatment: Full Comfort Care  Code Status:  DNR  Prognosis:   Hours - Days   Discharge Planning:  Anticipated Hospital Death  Care plan was discussed with family at bedside.  Thank you for allowing the Palliative Medicine Team to assist in the care of this patient.  Total time spent:  35 min.     Greater than 50%  of this time was spent counseling and coordinating care related to the above assessment and plan.  Florentina Jenny, PA-C Palliative Medicine  Please contact Palliative MedicineTeam phone at [phone] for questions and concerns between 7 am - 7 pm.   Please see AMION for individual provider pager numbers.

## 2018-10-30 MED ORDER — KETOROLAC TROMETHAMINE 30 MG/ML IJ SOLN
30.0000 mg | Freq: Four times a day (QID) | INTRAMUSCULAR | Status: DC
  Administered 2018-10-30 (×2): 30 mg via INTRAVENOUS
  Filled 2018-10-30 (×2): qty 1

## 2018-11-12 NOTE — Progress Notes (Signed)
Patient ID: Brian Barry, male   DOB: 01-09-1930, 85 y.o.   MRN: [id number]       Subjective: No further seizure activity per wife except some slight twitching of his mouth at one point yesterday.  Otherwise very comfortable.  Objective: Vital signs in last 24 hours: Temp:  [100.7 F (38.2 C)-102.7 F (39.3 C)] 100.7 F (38.2 C) (06/18 0501) Pulse Rate:  [90-98] 90 (06/18 0501) Resp:  [10-12] 12 (06/18 0501) BP: (84-90)/(44-48) 84/44 (06/18 0501) SpO2:  [78 %-84 %] 84 % (06/18 0501) Last BM Date: 10/26/18  Intake/Output from previous day: 06/17 0701 - 06/18 0700 In: 0  Out: 125 [Urine:125] Intake/Output this shift: No intake/output data recorded.  PE: Gen: NAD Heart: regular Lungs: CTAB, respiratory rate slowed Abd: soft, NT Ext: LUE with edema.  Finger tips are losing their color and are pale  Lab Results:  No results for input(s): WBC, HGB, HCT, PLT in the last 72 hours. BMET No results for input(s): NA, K, CL, CO2, GLUCOSE, BUN, CREATININE, CALCIUM in the last 72 hours. PT/INR No results for input(s): LABPROT, INR in the last 72 hours. CMP     Component Value Date/Time   NA 135 10/26/2018 0613   NA 136 09/20/2011 1509   K 3.7 10/26/2018 0613   K 5.0 09/20/2011 1509   CL 105 10/26/2018 0613   CL 99 09/20/2011 1509   CO2 18 (L) 10/26/2018 0613   CO2 31 09/20/2011 1509   GLUCOSE 122 (H) 10/26/2018 0613   GLUCOSE 193 (H) 09/20/2011 1509   BUN 18 10/26/2018 0613   BUN 15 09/20/2011 1509   CREATININE 0.88 10/26/2018 0613   CREATININE 0.77 09/20/2011 1509   CALCIUM 7.8 (L) 10/26/2018 0613   CALCIUM 9.1 09/20/2011 1509   PROT 6.8 10/29/2018 0728   ALBUMIN 4.3 11/08/2018 0728   AST 29 10/20/2018 0728   ALT 14 10/14/2018 0728   ALKPHOS 50 10/23/2018 0728   BILITOT 0.5 10/16/2018 0728   GFRNONAA >60 10/26/2018 0613   GFRNONAA >60 09/20/2011 1509   GFRAA >60 10/26/2018 0613   GFRAA >60 09/20/2011 1509   Lipase  No results found for: LIPASE      Studies/Results: No results found.  Anti-infectives: Anti-infectives (From admission, onward)   Start     Dose/Rate Route Frequency Ordered Stop   10/25/18 0930  cefTRIAXone (ROCEPHIN) 1 g in sodium chloride 0.9 % 100 mL IVPB  Status:  Discontinued     1 g 200 mL/hr over 30 Minutes Intravenous Every 24 hours 10/25/18 0919 10/26/18 1253       Assessment/Plan Hx of TIA HTN HLD T2DM - SSI BPH Hypothyroidism  Mild dementia  Fall Bilateral SDH  DNR Seizures- Keppra, ativan gtt  Dispo:Continue comfort care. Appreciate palliative assistance. No longer transferring to hospice, plan inpatient for EOL care.  Patient has donated his body to science at [facility] [employer]  He will need to go to the [facility] have this case signed off on.  I have asked the RN to call and let the morgue know that upon his death the family desires him to go to [place] as quickly as possible per their protocol.  I have also asked the family to call [place] to make them aware of the situation as well to assure this is still possible.   LOS: 6 days    Henreitta Cea , [facility] 11-15-2018, 8:42 AM Pager: [phone]

## 2018-11-12 NOTE — Progress Notes (Signed)
Patient DNR, no pulse, no respirations, unresponsive, family at bedside, notified physician on call, Narda Amber donor called spoke with Ronney Asters. Pt is not suitable for organ donor. Pt is an ME case, talked to Dr. Gertie Fey. Body is to be donated to  [employer] Time of death August 08, 1843 Nov 01, 2018. Patient belongings given to the family.

## 2018-11-12 NOTE — Death Summary Note (Signed)
DEATH SUMMARY   Patient Details  Name: Brian Barry MRN: [id number] DOB: 09-25-1929  Admission/Discharge Information   Admit Date:  29-Oct-2018  Date of Death: Date of Death: 11/04/2018  Time of Death: Time of Death: 08-11-43  Length of Stay: 6  Referring Physician: Derinda Late, MD   Reason(s) for Hospitalization  Fall  Bilateral subdural hematoma  Diagnoses  Preliminary cause of death: cardiorespiratory arrest Secondary Diagnoses (including complications and co-morbidities):  Active Problems:   Subdural hematoma ([place])   Palliative care by specialist   Terminal care   Fall   Seizures [facility])   Dyspnea   Increased oropharyngeal secretions   Brief Hospital Course (including significant findings, care, treatment, and services provided and events leading to death)  Brian Barry is a 85 y.o. year old male who fell and sustained bilateral SDHs along with seizure activity.  Neurosurgery was consulted and ASA and plavix were stopped.  Findings were discussed with the family who did not wish to pursue any surgical intervention.  He was placed on Rocephin for a UTI the following day.  The family expressed their wishes for no intubation, CPR, feeding tube etc.  The patient unfortunately developed seizure activity on HD 2.  He was placed on Keppra and ultimately had to be placed on an ativan gtt due to persistent activity.  Palliative care was consulted and the family wished to pursue comfort care.  He was then made comfortable and ultimately passed away on 2018-11-04 at 1845.   Pertinent Labs and Studies  Significant Diagnostic Studies Ct Angio Head W Or Wo Contrast  Result Date: 10-29-2018 CLINICAL DATA:  Fall. Leftward gaze, right facial droop, right hemianopia, and aphasia. Subdural hematoma on earlier head CT. EXAM: CT ANGIOGRAPHY HEAD AND NECK CT CERVICAL SPINE WITHOUT CONTRAST TECHNIQUE: Multidetector CT imaging of the head and neck was performed using the standard protocol during  bolus administration of intravenous contrast. Multiplanar CT image reconstructions and MIPs were obtained to evaluate the vascular anatomy. Carotid stenosis measurements (when applicable) are obtained utilizing NASCET criteria, using the distal internal carotid diameter as the denominator. CT images of the cervical spine including coronal and sagittal reformats were reconstructed from the CTA data set. CONTRAST:  191mL OMNIPAQUE IOHEXOL 350 MG/ML SOLN COMPARISON:  Noncontrast head CT 10-29-18 at 0733 hours. Brain MRI 05/25/2018. Head and neck CTA 11/12/2017. FINDINGS: CT HEAD FINDINGS There is moderate motion artifact through the skull base. Brain: Chronic infarcts are again seen in the right frontal lobe, bilateral basal ganglia, right thalamus, and left pons patchy bilateral cerebral white matter hypodensities are nonspecific but compatible with moderate chronic small vessel ischemic disease. There is moderate cerebral atrophy. A small subdural hematoma is again noted over the left cerebral convexity measuring up to 7 mm in thickness without midline shift or other significant mass effect. There is also a smaller subdural hematoma over the right frontotemporal convexity measuring up to 5 mm in thickness without mass effect which was obscured by motion artifact on the earlier study. Vascular: Calcified atherosclerosis at the skull base. Skull: No fracture identified within limitations of motion artifact. Mild right sided scalp swelling. Sinuses: Paranasal sinuses and mastoid air cells are clear. Orbits: Bilateral cataract extraction. Review of the MIP images confirms the above findings CTA NECK FINDINGS Aortic arch: Standard 3 vessel aortic arch with widely patent arch vessel origins. Mild arch atherosclerosis. Moderate calcified plaque in the proximal left subclavian artery not resulting in significant stenosis. Right carotid system: Patent with mild  predominantly calcified plaque about the carotid bifurcation.  No evidence of significant stenosis or dissection. Left carotid system: Patent with mild-to-moderate predominantly calcified plaque at the carotid bifurcation and in the carotid bulb. No evidence of significant stenosis or dissection. Vertebral arteries: Patent with the left being mildly dominant. Unchanged calcified plaque at the left vertebral artery origin resulting in mild stenosis. Skeleton: No acute osseous abnormality identified. Detailed cervical spine assessment as reported below. Other neck: No evidence of cervical lymphadenopathy or mass. Upper chest: Clear lung apices. Review of the MIP images confirms the above findings CTA HEAD FINDINGS Anterior circulation: The internal carotid arteries are patent from skull base to carotid termini with atherosclerotic plaque resulting in mild bilateral paraclinoid stenosis, stable to mildly progressed. ACAs and MCAs are patent without evidence of significant A1 or M1 stenosis. Branch vessel assessment is mildly to moderately limited by motion artifact, however no proximal branch occlusion is identified. No aneurysm is seen. Posterior circulation: The intracranial vertebral arteries are patent to the basilar with mild atherosclerotic irregularity resulting in less than 50% narrowing on the right. The basilar artery is widely patent. Posterior communicating arteries are not identified and may be diminutive or absent. The PCAs are patent with bilateral proximal P2 stenoses, severe on the right and moderate on the left and which appear to have mildly progressed from the prior CTA although motion artifact mildly limits assessment. There is also a severe distal right P2 stenosis which is new. No aneurysm is identified. Venous sinuses: Poorly evaluated due to arterial contrast timing. Anatomic variants: None. Review of the MIP images confirms the above findings CT CERVICAL SPINE FINDINGS Advanced facet degeneration in the cervical spine with trace anterolisthesis of C3 on  C4 and 3 mm anterolisthesis of C4 on C5, C5 on C6, and C7 on T1. Multilevel disc degeneration, severe at C6-7. Moderate to severe multilevel neural foraminal stenosis. No evidence of significant osseous spinal canal stenosis. No acute fracture identified. No visible spinal canal hematoma or prevertebral fluid. IMPRESSION: HEAD CT: 1. Small bilateral subdural hematomas, left larger than right. No mass effect. 2. Chronic ischemic changes with multiple old infarcts as above. HEAD AND NECK CTA: 1. No emergent large vessel occlusion. 2. Progressive intracranial atherosclerosis including severe right and moderate left proximal P2 stenoses. 3. Widely patent cervical carotid arteries. 4. Unchanged mild left vertebral artery origin stenosis. 5.  Aortic Atherosclerosis (ICD10-I70.0). CERVICAL SPINE CT: 1. No acute cervical spine fracture. 2. Advanced cervical disc and facet degeneration. Electronically Signed   By: Logan Bores M.D.   On: 10/28/2018 08:48   Ct Angio Neck W Or Wo Contrast  Result Date: 10/23/2018 CLINICAL DATA:  Fall. Leftward gaze, right facial droop, right hemianopia, and aphasia. Subdural hematoma on earlier head CT. EXAM: CT ANGIOGRAPHY HEAD AND NECK CT CERVICAL SPINE WITHOUT CONTRAST TECHNIQUE: Multidetector CT imaging of the head and neck was performed using the standard protocol during bolus administration of intravenous contrast. Multiplanar CT image reconstructions and MIPs were obtained to evaluate the vascular anatomy. Carotid stenosis measurements (when applicable) are obtained utilizing NASCET criteria, using the distal internal carotid diameter as the denominator. CT images of the cervical spine including coronal and sagittal reformats were reconstructed from the CTA data set. CONTRAST:  169mL OMNIPAQUE IOHEXOL 350 MG/ML SOLN COMPARISON:  Noncontrast head CT 11/03/2018 at 0733 hours. Brain MRI 05/25/2018. Head and neck CTA 11/12/2017. FINDINGS: CT HEAD FINDINGS There is moderate motion  artifact through the skull base. Brain: Chronic infarcts are again seen  in the right frontal lobe, bilateral basal ganglia, right thalamus, and left pons patchy bilateral cerebral white matter hypodensities are nonspecific but compatible with moderate chronic small vessel ischemic disease. There is moderate cerebral atrophy. A small subdural hematoma is again noted over the left cerebral convexity measuring up to 7 mm in thickness without midline shift or other significant mass effect. There is also a smaller subdural hematoma over the right frontotemporal convexity measuring up to 5 mm in thickness without mass effect which was obscured by motion artifact on the earlier study. Vascular: Calcified atherosclerosis at the skull base. Skull: No fracture identified within limitations of motion artifact. Mild right sided scalp swelling. Sinuses: Paranasal sinuses and mastoid air cells are clear. Orbits: Bilateral cataract extraction. Review of the MIP images confirms the above findings CTA NECK FINDINGS Aortic arch: Standard 3 vessel aortic arch with widely patent arch vessel origins. Mild arch atherosclerosis. Moderate calcified plaque in the proximal left subclavian artery not resulting in significant stenosis. Right carotid system: Patent with mild predominantly calcified plaque about the carotid bifurcation. No evidence of significant stenosis or dissection. Left carotid system: Patent with mild-to-moderate predominantly calcified plaque at the carotid bifurcation and in the carotid bulb. No evidence of significant stenosis or dissection. Vertebral arteries: Patent with the left being mildly dominant. Unchanged calcified plaque at the left vertebral artery origin resulting in mild stenosis. Skeleton: No acute osseous abnormality identified. Detailed cervical spine assessment as reported below. Other neck: No evidence of cervical lymphadenopathy or mass. Upper chest: Clear lung apices. Review of the MIP images  confirms the above findings CTA HEAD FINDINGS Anterior circulation: The internal carotid arteries are patent from skull base to carotid termini with atherosclerotic plaque resulting in mild bilateral paraclinoid stenosis, stable to mildly progressed. ACAs and MCAs are patent without evidence of significant A1 or M1 stenosis. Branch vessel assessment is mildly to moderately limited by motion artifact, however no proximal branch occlusion is identified. No aneurysm is seen. Posterior circulation: The intracranial vertebral arteries are patent to the basilar with mild atherosclerotic irregularity resulting in less than 50% narrowing on the right. The basilar artery is widely patent. Posterior communicating arteries are not identified and may be diminutive or absent. The PCAs are patent with bilateral proximal P2 stenoses, severe on the right and moderate on the left and which appear to have mildly progressed from the prior CTA although motion artifact mildly limits assessment. There is also a severe distal right P2 stenosis which is new. No aneurysm is identified. Venous sinuses: Poorly evaluated due to arterial contrast timing. Anatomic variants: None. Review of the MIP images confirms the above findings CT CERVICAL SPINE FINDINGS Advanced facet degeneration in the cervical spine with trace anterolisthesis of C3 on C4 and 3 mm anterolisthesis of C4 on C5, C5 on C6, and C7 on T1. Multilevel disc degeneration, severe at C6-7. Moderate to severe multilevel neural foraminal stenosis. No evidence of significant osseous spinal canal stenosis. No acute fracture identified. No visible spinal canal hematoma or prevertebral fluid. IMPRESSION: HEAD CT: 1. Small bilateral subdural hematomas, left larger than right. No mass effect. 2. Chronic ischemic changes with multiple old infarcts as above. HEAD AND NECK CTA: 1. No emergent large vessel occlusion. 2. Progressive intracranial atherosclerosis including severe right and moderate  left proximal P2 stenoses. 3. Widely patent cervical carotid arteries. 4. Unchanged mild left vertebral artery origin stenosis. 5.  Aortic Atherosclerosis (ICD10-I70.0). CERVICAL SPINE CT: 1. No acute cervical spine fracture. 2. Advanced cervical disc  and facet degeneration. Electronically Signed   By: Logan Bores M.D.   On: 10/16/2018 08:48   Ct C-spine No Charge  Result Date: 11/01/2018 CLINICAL DATA:  Fall. Leftward gaze, right facial droop, right hemianopia, and aphasia. Subdural hematoma on earlier head CT. EXAM: CT ANGIOGRAPHY HEAD AND NECK CT CERVICAL SPINE WITHOUT CONTRAST TECHNIQUE: Multidetector CT imaging of the head and neck was performed using the standard protocol during bolus administration of intravenous contrast. Multiplanar CT image reconstructions and MIPs were obtained to evaluate the vascular anatomy. Carotid stenosis measurements (when applicable) are obtained utilizing NASCET criteria, using the distal internal carotid diameter as the denominator. CT images of the cervical spine including coronal and sagittal reformats were reconstructed from the CTA data set. CONTRAST:  134mL OMNIPAQUE IOHEXOL 350 MG/ML SOLN COMPARISON:  Noncontrast head CT 10/29/2018 at 0733 hours. Brain MRI 05/25/2018. Head and neck CTA 11/12/2017. FINDINGS: CT HEAD FINDINGS There is moderate motion artifact through the skull base. Brain: Chronic infarcts are again seen in the right frontal lobe, bilateral basal ganglia, right thalamus, and left pons patchy bilateral cerebral white matter hypodensities are nonspecific but compatible with moderate chronic small vessel ischemic disease. There is moderate cerebral atrophy. A small subdural hematoma is again noted over the left cerebral convexity measuring up to 7 mm in thickness without midline shift or other significant mass effect. There is also a smaller subdural hematoma over the right frontotemporal convexity measuring up to 5 mm in thickness without mass effect  which was obscured by motion artifact on the earlier study. Vascular: Calcified atherosclerosis at the skull base. Skull: No fracture identified within limitations of motion artifact. Mild right sided scalp swelling. Sinuses: Paranasal sinuses and mastoid air cells are clear. Orbits: Bilateral cataract extraction. Review of the MIP images confirms the above findings CTA NECK FINDINGS Aortic arch: Standard 3 vessel aortic arch with widely patent arch vessel origins. Mild arch atherosclerosis. Moderate calcified plaque in the proximal left subclavian artery not resulting in significant stenosis. Right carotid system: Patent with mild predominantly calcified plaque about the carotid bifurcation. No evidence of significant stenosis or dissection. Left carotid system: Patent with mild-to-moderate predominantly calcified plaque at the carotid bifurcation and in the carotid bulb. No evidence of significant stenosis or dissection. Vertebral arteries: Patent with the left being mildly dominant. Unchanged calcified plaque at the left vertebral artery origin resulting in mild stenosis. Skeleton: No acute osseous abnormality identified. Detailed cervical spine assessment as reported below. Other neck: No evidence of cervical lymphadenopathy or mass. Upper chest: Clear lung apices. Review of the MIP images confirms the above findings CTA HEAD FINDINGS Anterior circulation: The internal carotid arteries are patent from skull base to carotid termini with atherosclerotic plaque resulting in mild bilateral paraclinoid stenosis, stable to mildly progressed. ACAs and MCAs are patent without evidence of significant A1 or M1 stenosis. Branch vessel assessment is mildly to moderately limited by motion artifact, however no proximal branch occlusion is identified. No aneurysm is seen. Posterior circulation: The intracranial vertebral arteries are patent to the basilar with mild atherosclerotic irregularity resulting in less than 50%  narrowing on the right. The basilar artery is widely patent. Posterior communicating arteries are not identified and may be diminutive or absent. The PCAs are patent with bilateral proximal P2 stenoses, severe on the right and moderate on the left and which appear to have mildly progressed from the prior CTA although motion artifact mildly limits assessment. There is also a severe distal right P2 stenosis which is  new. No aneurysm is identified. Venous sinuses: Poorly evaluated due to arterial contrast timing. Anatomic variants: None. Review of the MIP images confirms the above findings CT CERVICAL SPINE FINDINGS Advanced facet degeneration in the cervical spine with trace anterolisthesis of C3 on C4 and 3 mm anterolisthesis of C4 on C5, C5 on C6, and C7 on T1. Multilevel disc degeneration, severe at C6-7. Moderate to severe multilevel neural foraminal stenosis. No evidence of significant osseous spinal canal stenosis. No acute fracture identified. No visible spinal canal hematoma or prevertebral fluid. IMPRESSION: HEAD CT: 1. Small bilateral subdural hematomas, left larger than right. No mass effect. 2. Chronic ischemic changes with multiple old infarcts as above. HEAD AND NECK CTA: 1. No emergent large vessel occlusion. 2. Progressive intracranial atherosclerosis including severe right and moderate left proximal P2 stenoses. 3. Widely patent cervical carotid arteries. 4. Unchanged mild left vertebral artery origin stenosis. 5.  Aortic Atherosclerosis (ICD10-I70.0). CERVICAL SPINE CT: 1. No acute cervical spine fracture. 2. Advanced cervical disc and facet degeneration. Electronically Signed   By: Logan Bores M.D.   On: 10/26/2018 08:48   Ct Head Code Stroke Wo Contrast  Result Date: 10/28/2018 CLINICAL DATA:  Code stroke.  Altered mental status. EXAM: CT HEAD WITHOUT CONTRAST TECHNIQUE: Contiguous axial images were obtained from the base of the skull through the vertex without intravenous contrast.  COMPARISON:  MRI brain 05/25/2018 FINDINGS: Brain: The study is severely degraded by patient motion. Remote infarcts involving the right greater than left basal ganglia and bilateral corona radiata are again noted. Remote right frontal lobe infarct is noted. Left subdural hematoma is suspected. There is no significant midline shift. No definite posterior fossa lesions are present. The ventricles are proportionate to the degree of atrophy. Ventricle size is stable. Vascular: Motion obscures evaluation of the vessels. Skull: Motion decreases sensitivity for skull lesions. No definite fractures are present. Sinuses/Orbits: No significant sinus disease is evident. ASPECTS [facility] Stroke Program Early CT Score) - Ganglionic level infarction (caudate, lentiform nuclei, internal capsule, insula, M1-M3 cortex): A 7/7 - Supraganglionic infarction (M4-M6 cortex): 3/3 Total score (0-10 with 10 being normal): 10/10 IMPRESSION: 1. Small left subdural hematoma without significant midline shift. 2. Multiple remote infarcts are stable. 3. The study is severely degraded by patient motion. 4. No acute infarcts are evident. 5. ASPECTS is 10/10 These results were called by telephone at the time of interpretation on 10/23/2018 at 7:53 am to Dr. Leonel Ramsay, who verbally acknowledged these results. Electronically Signed   By: San Morelle M.D.   On: 10/23/2018 07:54    Microbiology Recent Results (from the past 240 hour(s))  Novel Coronavirus,NAA,(SEND-OUT TO REF LAB - TAT 24-48 hrs); Hosp Order     Status: None   Collection Time: 10/29/2018  9:15 AM   Specimen: Nasopharyngeal Swab; Respiratory  Result Value Ref Range Status   SARS-CoV-2, NAA NOT DETECTED NOT DETECTED Final    Comment: (NOTE) This test was developed and its performance characteristics determined by [employer]. This test has not been FDA cleared or approved. This test has been authorized by FDA under an Emergency Use Authorization (EUA). This  test is only authorized for the duration of time the declaration that circumstances exist justifying the authorization of the emergency use of in vitro diagnostic tests for detection of SARS-CoV-2 virus and/or diagnosis of COVID-19 infection under section 564(b)(1) of the Act, 21 U.S.C. 009QZR-0(Q)(7), unless the authorization is terminated or revoked sooner. When diagnostic testing is negative, the possibility of a false  negative result should be considered in the context of a patient's recent exposures and the presence of clinical signs and symptoms consistent with COVID-19. An individual without symptoms of COVID-19 and who is not shedding SARS-CoV-2 virus would expect to have a negative (not detected) result in this assay. Performed  At: [facility] [address] [place], [place] [id number] Rush Farmer MD WL:[id number]    [place]  Final    Comment: Performed at [facility], [place] [address], [place], [place] [address]  MRSA PCR Screening     Status: None   Collection Time: 10/22/2018 11:28 PM   Specimen: Nasal Mucosa; Nasopharyngeal  Result Value Ref Range Status   MRSA by PCR NEGATIVE NEGATIVE Final    Comment:        The GeneXpert MRSA Assay (FDA approved for NASAL specimens only), is one component of a comprehensive MRSA colonization surveillance program. It is not intended to diagnose MRSA infection nor to guide or monitor treatment for MRSA infections. Performed at [facility], [place] [address], [place], [place] [address]   Culture, Urine     Status: Abnormal   Collection Time: 10/25/18 11:42 AM   Specimen: Urine, Random  Result Value Ref Range Status   Specimen Description URINE, RANDOM  Final   Special Requests   Final    NONE Performed at [facility], [place] [address] [place], [place] [address]    Culture 50,000 COLONIES/mL KLEBSIELLA PNEUMONIAE (A)  Final   Report Status 10/27/2018 FINAL  Final   Organism  ID, Bacteria KLEBSIELLA PNEUMONIAE (A)  Final      Susceptibility   Klebsiella pneumoniae - MIC*    AMPICILLIN >=32 RESISTANT Resistant     CEFAZOLIN <=4 SENSITIVE Sensitive     CEFTRIAXONE <=1 SENSITIVE Sensitive     CIPROFLOXACIN <=0.25 SENSITIVE Sensitive     GENTAMICIN <=1 SENSITIVE Sensitive     IMIPENEM <=0.25 SENSITIVE Sensitive     NITROFURANTOIN 128 RESISTANT Resistant     TRIMETH/SULFA <=20 SENSITIVE Sensitive     AMPICILLIN/SULBACTAM 8 SENSITIVE Sensitive     PIP/TAZO <=4 SENSITIVE Sensitive     Extended ESBL NEGATIVE Sensitive     * 50,000 COLONIES/mL KLEBSIELLA PNEUMONIAE    Lab Basic Metabolic Panel: Recent Labs  Lab 10/25/18 0459 10/26/18 0613  NA 130* 135  K 3.3* 3.7  CL 99 105  CO2 17* 18*  GLUCOSE 301* 122*  BUN 15 18  CREATININE 0.99 0.88  CALCIUM 8.2* 7.8*   Liver Function Tests: No results for input(s): AST, ALT, ALKPHOS, BILITOT, PROT, ALBUMIN in the last 168 hours. No results for input(s): LIPASE, AMYLASE in the last 168 hours. No results for input(s): AMMONIA in the last 168 hours. CBC: Recent Labs  Lab 10/25/18 0459 10/26/18 0613  WBC 11.1* 15.0*  HGB 10.5* 10.4*  HCT 30.2* 30.6*  MCV 92.1 95.6  PLT 165 141*   Cardiac Enzymes: No results for input(s): CKTOTAL, CKMB, CKMBINDEX, TROPONINI in the last 168 hours. Sepsis Labs: Recent Labs  Lab 10/25/18 0459 10/26/18 0613  WBC 11.1* 15.0*    Procedures/Operations  None   Henreitta Cea 10/31/2018, 4:23 PM

## 2018-11-12 DEATH — deceased
